# Patient Record
Sex: Male | Born: 1945 | Race: White | Hispanic: No | Marital: Married | Smoking: Never smoker
Health system: Southern US, Community
[De-identification: ages and names within clinical notes are randomized; demographics above are authoritative.]

## PROBLEM LIST (undated history)

## (undated) DIAGNOSIS — H9191 Unspecified hearing loss, right ear: Secondary | ICD-10-CM

## (undated) DIAGNOSIS — L570 Actinic keratosis: Secondary | ICD-10-CM

## (undated) DIAGNOSIS — E119 Type 2 diabetes mellitus without complications: Secondary | ICD-10-CM

## (undated) DIAGNOSIS — R112 Nausea with vomiting, unspecified: Secondary | ICD-10-CM

## (undated) DIAGNOSIS — E785 Hyperlipidemia, unspecified: Secondary | ICD-10-CM

## (undated) DIAGNOSIS — I10 Essential (primary) hypertension: Secondary | ICD-10-CM

## (undated) DIAGNOSIS — R490 Dysphonia: Secondary | ICD-10-CM

## (undated) DIAGNOSIS — I2510 Atherosclerotic heart disease of native coronary artery without angina pectoris: Secondary | ICD-10-CM

## (undated) DIAGNOSIS — Z8719 Personal history of other diseases of the digestive system: Secondary | ICD-10-CM

## (undated) DIAGNOSIS — M109 Gout, unspecified: Secondary | ICD-10-CM

## (undated) DIAGNOSIS — R42 Dizziness and giddiness: Secondary | ICD-10-CM

## (undated) DIAGNOSIS — D333 Benign neoplasm of cranial nerves: Secondary | ICD-10-CM

## (undated) DIAGNOSIS — N189 Chronic kidney disease, unspecified: Secondary | ICD-10-CM

## (undated) DIAGNOSIS — Z9889 Other specified postprocedural states: Secondary | ICD-10-CM

## (undated) HISTORY — DX: Actinic keratosis: L57.0

## (undated) HISTORY — PX: BACK SURGERY: SHX140

## (undated) HISTORY — DX: Hyperlipidemia, unspecified: E78.5

## (undated) HISTORY — PX: CARPAL TUNNEL RELEASE: SHX101

## (undated) HISTORY — DX: Essential (primary) hypertension: I10

## (undated) HISTORY — PX: OTHER SURGICAL HISTORY: SHX169

## (undated) HISTORY — DX: Personal history of other diseases of the digestive system: Z87.19

## (undated) HISTORY — DX: Type 2 diabetes mellitus without complications: E11.9

## (undated) HISTORY — DX: Atherosclerotic heart disease of native coronary artery without angina pectoris: I25.10

## (undated) HISTORY — DX: Chronic kidney disease, unspecified: N18.9

## (undated) HISTORY — DX: Dysphonia: R49.0

## (undated) HISTORY — DX: Unspecified hearing loss, right ear: H91.91

## (undated) HISTORY — PX: CORONARY ANGIOPLASTY WITH STENT PLACEMENT: SHX49

## (undated) HISTORY — DX: Benign neoplasm of cranial nerves: D33.3

---

## 1994-01-03 DIAGNOSIS — Z8719 Personal history of other diseases of the digestive system: Secondary | ICD-10-CM

## 1994-01-03 HISTORY — DX: Personal history of other diseases of the digestive system: Z87.19

## 1997-08-27 ENCOUNTER — Ambulatory Visit (HOSPITAL_COMMUNITY): Admission: RE | Admit: 1997-08-27 | Discharge: 1997-08-27 | Payer: Self-pay | Admitting: Neurosurgery

## 1997-09-05 ENCOUNTER — Ambulatory Visit (HOSPITAL_COMMUNITY): Admission: RE | Admit: 1997-09-05 | Discharge: 1997-09-06 | Payer: Self-pay | Admitting: Neurosurgery

## 1997-09-05 ENCOUNTER — Encounter: Payer: Self-pay | Admitting: Neurosurgery

## 2002-08-19 ENCOUNTER — Encounter: Payer: Self-pay | Admitting: Neurosurgery

## 2002-08-20 ENCOUNTER — Encounter: Payer: Self-pay | Admitting: Neurosurgery

## 2002-08-20 ENCOUNTER — Inpatient Hospital Stay (HOSPITAL_COMMUNITY): Admission: RE | Admit: 2002-08-20 | Discharge: 2002-08-21 | Payer: Self-pay | Admitting: Neurosurgery

## 2004-04-22 ENCOUNTER — Ambulatory Visit: Payer: Self-pay | Admitting: Specialist

## 2005-11-03 HISTORY — PX: CORONARY ARTERY BYPASS GRAFT: SHX141

## 2005-11-18 ENCOUNTER — Encounter: Payer: Self-pay | Admitting: Vascular Surgery

## 2005-11-20 ENCOUNTER — Inpatient Hospital Stay (HOSPITAL_COMMUNITY): Admission: RE | Admit: 2005-11-20 | Discharge: 2005-11-27 | Payer: Self-pay | Admitting: Cardiovascular Disease

## 2005-12-21 ENCOUNTER — Encounter: Payer: Self-pay | Admitting: Psychiatry

## 2005-12-23 ENCOUNTER — Encounter
Admission: RE | Admit: 2005-12-23 | Discharge: 2005-12-23 | Payer: Self-pay | Admitting: Thoracic Surgery (Cardiothoracic Vascular Surgery)

## 2006-01-03 ENCOUNTER — Encounter: Payer: Self-pay | Admitting: Psychiatry

## 2006-02-03 ENCOUNTER — Encounter: Payer: Self-pay | Admitting: Psychiatry

## 2006-03-04 ENCOUNTER — Encounter: Payer: Self-pay | Admitting: Psychiatry

## 2006-04-04 ENCOUNTER — Encounter: Payer: Self-pay | Admitting: Psychiatry

## 2007-04-23 ENCOUNTER — Encounter: Payer: Self-pay | Admitting: Family Medicine

## 2007-05-04 ENCOUNTER — Encounter: Payer: Self-pay | Admitting: Family Medicine

## 2007-06-26 ENCOUNTER — Ambulatory Visit (HOSPITAL_COMMUNITY): Admission: RE | Admit: 2007-06-26 | Discharge: 2007-06-26 | Payer: Self-pay | Admitting: Cardiovascular Disease

## 2008-07-21 IMAGING — CR DG CHEST 1V PORT
1 series · 1 of 1 positions shown · non-contrast
Comparison: 11/22/05 at 0444 hours.

CLINICAL DATA: Status post CABG.  
 PORTABLE CHEST - 1 VIEW, 11/23/05 AT 3943 HOURS:

[view not recorded]
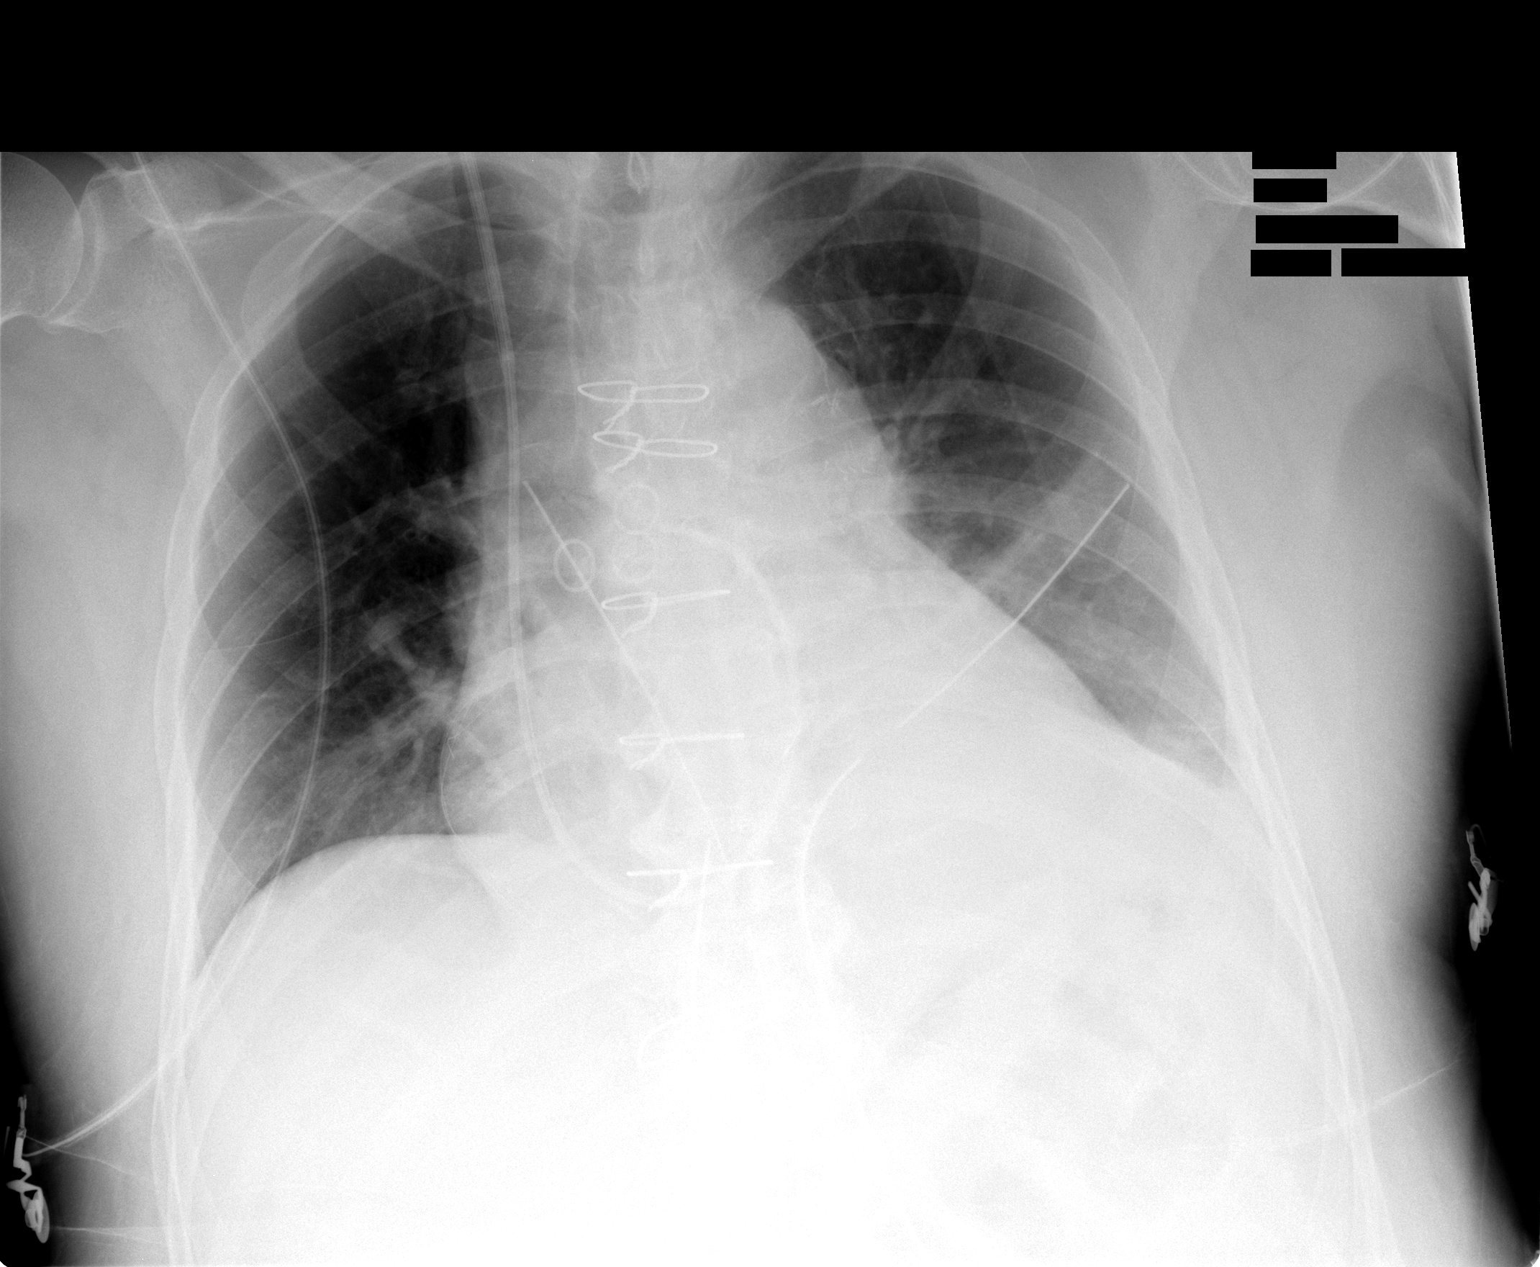

[1 of 1 positions shown; findings below may reference images not displayed]

FINDINGS: Endotracheal tube and nasogastric tube have been removed.  Right-sided Swan Ganz catheter tip projects over the right main pulmonary artery.  Left-sided chest tube remains in place without evidence of pneumothorax.   Mediastinal drain in place.  Cardiomegaly.  Central pulmonary vascular prominence.  Interval development of opacification medial aspect of left base may represent atelectasis.  Epicardial leads remain in place.
IMPRESSION: 1.  Endotracheal tube and nasogastric tube have been removed.
 2.  Interval development of opacification, medial aspect, left base may represent atelectasis.
 3.  Remainder of findings unchanged.

## 2008-11-13 LAB — HM COLONOSCOPY

## 2009-01-22 ENCOUNTER — Encounter: Payer: Self-pay | Admitting: Cardiovascular Disease

## 2009-03-19 ENCOUNTER — Ambulatory Visit: Payer: Self-pay | Admitting: Gastroenterology

## 2009-07-08 ENCOUNTER — Ambulatory Visit: Payer: Self-pay | Admitting: Otolaryngology

## 2009-07-14 ENCOUNTER — Ambulatory Visit: Payer: Self-pay | Admitting: Cardiovascular Disease

## 2009-07-14 DIAGNOSIS — I10 Essential (primary) hypertension: Secondary | ICD-10-CM

## 2009-07-14 DIAGNOSIS — E785 Hyperlipidemia, unspecified: Secondary | ICD-10-CM

## 2009-07-14 DIAGNOSIS — E119 Type 2 diabetes mellitus without complications: Secondary | ICD-10-CM | POA: Insufficient documentation

## 2009-07-14 DIAGNOSIS — I25810 Atherosclerosis of coronary artery bypass graft(s) without angina pectoris: Secondary | ICD-10-CM | POA: Insufficient documentation

## 2009-07-14 DIAGNOSIS — E1169 Type 2 diabetes mellitus with other specified complication: Secondary | ICD-10-CM | POA: Insufficient documentation

## 2009-07-14 DIAGNOSIS — I129 Hypertensive chronic kidney disease with stage 1 through stage 4 chronic kidney disease, or unspecified chronic kidney disease: Secondary | ICD-10-CM | POA: Insufficient documentation

## 2009-07-14 DIAGNOSIS — I2510 Atherosclerotic heart disease of native coronary artery without angina pectoris: Secondary | ICD-10-CM | POA: Insufficient documentation

## 2010-01-03 DIAGNOSIS — D333 Benign neoplasm of cranial nerves: Secondary | ICD-10-CM

## 2010-01-03 HISTORY — DX: Benign neoplasm of cranial nerves: D33.3

## 2010-01-20 ENCOUNTER — Ambulatory Visit
Admission: RE | Admit: 2010-01-20 | Discharge: 2010-01-20 | Payer: Self-pay | Source: Home / Self Care | Attending: Cardiovascular Disease | Admitting: Cardiovascular Disease

## 2010-01-20 ENCOUNTER — Encounter: Payer: Self-pay | Admitting: Cardiovascular Disease

## 2010-02-02 NOTE — Assessment & Plan Note (Signed)
Summary: NEW PT   Visit Type:  Initial Consult Primary Provider:  Dr. Zada Finders  CC:  No cardiac problems.  Has hearing loss and undergoing some test.  .  History of Present Illness: 65 year old gentleman with a history of coronary artery disease, bypass surgery x5 with a LIMA to the LAD, vein graft to the B1, vein graft to the OM1 and OM 2, vein graft to the RCA with repeat catheterization in June 2009 showing 95% stenosis of the ostial LAD with total occlusion of the proximal LAD, 70% proximal circumflex disease with occlusion of the marginal vessel from the AV groove, diffuse RCA disease with 80% proximal disease followed by 95% mid RCA disease, patent vein grafts. He has obesity, diabetes, hypertension, hyperlipidemia. He is new to [facility] cardiology, was previously seen by Dr. Tresa Endo at [facility] and vascular Center. Last note we have is from August 2009.  He presents to establish care. Overall he states that his sugars have been reasonably well controlled. He continues to work part time and stay active. He denies any symptoms of chest pain or shortness of breath. He used to have lower extremity edema though this has improved on low-dose diuretic. No lightheadedness or dizziness. He sleeps well.  He did receive recent news that a mass is apparent on MRI of his head that might be affecting his hearing. He is due to have followup today for this growth.  EKG shows normal sinus rhythm with rate of 82 beats per minute, no significant ST or T wave changes.  Lipids from January 2011; total cholesterol 127, HDL 38, LDL 60  Preventive Screening-Counseling & Management  Alcohol-Tobacco     Smoking Status: never  Caffeine-Diet-Exercise     Does Patient Exercise: no      Drug Use:  no.    Current Medications (verified): 1)  Glyburide-Metformin 2.5-500 Mg Tabs (Glyburide-Metformin) .Marland Kitchen.. 1 Two Times A Day 2)  Furosemide 40 Mg Tabs (Furosemide) .Marland Kitchen.. 1-2 Tablets Everyday 3)  Carvedilol 25  Mg Tabs (Carvedilol) .Marland Kitchen.. 1 Two Times A Day 4)  Oxybutynin Chloride 5 Mg Tabs (Oxybutynin Chloride) .Marland Kitchen.. 1 Once Daily 5)  Ramipril 5 Mg Caps (Ramipril) .Marland Kitchen.. 1 Once Daily 6)  Dexilant 30 Mg Cpdr (Dexlansoprazole) .Marland Kitchen.. 1 Once Daily 7)  Aspir-Low 81 Mg Tbec (Aspirin) .Marland Kitchen.. 1 Once Daily  Allergies (verified): 1)  ! Codeine  Past History:  Family History: Last updated: 07/14/2009 Family History of Coronary Artery Disease: father deceased age 39 with MI. Family History of Cancer:  Family History of Diabetes:  Family History of Coronary Artery Disease: brother  Social History: Last updated: 07/14/2009 Tobacco Use - No.  Alcohol Use - no Regular Exercise - no Drug Use - no Married  Retired: works part time at [employer] 2-3 hrs. at night.  Risk Factors: Exercise: no (07/14/2009)  Risk Factors: Smoking Status: never (07/14/2009)  Past Medical History: CAD Hyperlipidemia Hypertension diabetes mellitus hearing loss in the right ear hoarseness  Past Surgical History: CABG x 5: Nov.2007 carpal tunnel surg. ruptured disk surg.(neck) back surg. cyst removed on hand (right)  Family History: Family History of Coronary Artery Disease: father deceased age 100 with MI. Family History of Cancer:  Family History of Diabetes:  Family History of Coronary Artery Disease: brother  Social History: Tobacco Use - No.  Alcohol Use - no Regular Exercise - no Drug Use - no Married  Retired: works part time at [employer] 2-3 hrs. at night. Smoking Status:  never Does Patient Exercise:  no Drug Use:  no  Review of Systems  The patient denies fever, weight loss, weight gain, vision loss, decreased hearing, hoarseness, chest pain, syncope, dyspnea on exertion, peripheral edema, prolonged cough, abdominal pain, incontinence, muscle weakness, depression, and enlarged lymph nodes.    Vital Signs:  Patient profile:   65 year old male Height:      67 inches Weight:      239 pounds BMI:      37.57 Pulse rate:   82 / minute BP sitting:   150 / 86  (left arm) Cuff size:   regular  Vitals Entered By: Bishop Dublin, CMA (July 14, 2009 11:09 AM)  Nutrition Counseling: Patient's BMI is greater than 25 and therefore counseled on weight management options.  Physical Exam  General:  Well developed, well nourished, in no acute distress. Obese Head:  normocephalic and atraumatic Neck:  Neck supple, no JVD. No masses, thyromegaly or abnormal cervical nodes. Chest Wall:  no deformities or breast masses noted Lungs:  Clear bilaterally to auscultation and percussion. Heart:  Non-displaced PMI, chest non-tender; regular rate and rhythm, S1, S2 without murmurs, rubs or gallops. Carotid upstroke normal, no bruit. Pedals normal pulses. No edema, no varicosities. Abdomen:  Bowel sounds positive; abdomen soft and non-tender without masses Msk:  Back normal, normal gait. Muscle strength and tone normal. Pulses:  pulses normal in all 4 extremities Extremities:  No clubbing or cyanosis. Neurologic:  Alert and oriented x 3. Skin:  Intact without lesions or rashes. Psych:  Normal affect.   New Orders:     1)  EKG w/ Interpretation (93000)   Impression & Recommendations:  Problem # 1:  CAD, ARTERY BYPASS GRAFT (ICD-414.04) bypass surgery in November 2007, cardiac catheter in June 2009 showing patent grafts. No symptoms concerning for angina. We'll continue aggressive medical management.  His updated medication list for this problem includes:    Carvedilol 25 Mg Tabs (Carvedilol) .Marland Kitchen... 1 two times a day    Ramipril 5 Mg Caps (Ramipril) .Marland Kitchen... 1 once daily    Aspir-low 81 Mg Tbec (Aspirin) .Marland Kitchen... 1 once daily  Problem # 2:  HYPERLIPIDEMIA-MIXED (ICD-272.4) Cholesterol is well controlled on Vytorin 10/20 mg daily.  Problem # 3:  HYPERTENSION, BENIGN (ICD-401.1) blood pressure is borderline elevated. We've asked him to monitor his pressure. If it continues to be elevated, we could  increase his ACE inhibitor. He had some distressing use in that he had a report back from an MRI showing a mass in his head that was affecting his hearing. He believes that this is making him nervous.  His updated medication list for this problem includes:    Furosemide 40 Mg Tabs (Furosemide) .Marland Kitchen... 1-2 tablets everyday    Carvedilol 25 Mg Tabs (Carvedilol) .Marland Kitchen... 1 two times a day    Ramipril 5 Mg Caps (Ramipril) .Marland Kitchen... 1 once daily    Aspir-low 81 Mg Tbec (Aspirin) .Marland Kitchen... 1 once daily  Other Orders: EKG w/ Interpretation (93000)

## 2010-02-02 NOTE — Letter (Signed)
Summary: Historic Patient File  Historic Patient File   Imported By: [place]bo 07/15/2009 08:46:46  _____________________________________________________________________  External Attachment:    Type:   Image     Comment:   External Document

## 2010-02-04 NOTE — Assessment & Plan Note (Signed)
Summary: F/U 6 MONTHS   Visit Type:  Follow-up Primary Provider:  Dr. Zada Finders  CC:  "doing well" denies chest pain and SOB.  History of Present Illness: 65 year old gentleman with a history of coronary artery disease, bypass surgery x5 with a LIMA to the LAD, vein graft to the B1, vein graft to the OM1 and OM 2, vein graft to the RCA with repeat catheterization in June 2009 showing 95% stenosis of the ostial LAD with total occlusion of the proximal LAD, 70% proximal circumflex disease with occlusion of the marginal vessel from the AV groove, diffuse RCA disease with 80% proximal disease followed by 95% mid RCA disease, patent vein grafts. He has obesity, diabetes, hypertension, hyperlipidemia.   he reports that he is doing well. He walks without any significant chest pain or shortness of breath. He does report some episodes of pain in his teeth though he had a teeth cleaning and changed his toothbrush and his symptoms have resolved. Previous anginal symptoms included pain in the teeth. Currently he feels well with no complaints.  He does report having to decide between radiation treatment or surgical treatment for a growth in his head that is affecting his balance. Surgery or radiation treatment will be done at [facility]. He plans on having this done at the end of the summer  EKG shows normal sinus rhythm with rate of 88 beats per minute with no significant ST or T wave changes  Lipids from January 2011; total cholesterol 127, HDL 38, LDL 60  Current Medications (verified): 1)  Glyburide-Metformin 2.5-500 Mg Tabs (Glyburide-Metformin) .... 2 Tablets in Morning and 1 Tablet At Night 2)  Furosemide 40 Mg Tabs (Furosemide) .Marland Kitchen.. 1-2 Tablets Everyday 3)  Carvedilol 25 Mg Tabs (Carvedilol) .Marland Kitchen.. 1-2 Tablets Daily 4)  Ramipril 5 Mg Caps (Ramipril) .Marland Kitchen.. 1 Once Daily 5)  Aspir-Low 81 Mg Tbec (Aspirin) .... 2 Tablets Daily 6)  Vesicare 5 Mg Tabs (Solifenacin Succinate) .Marland Kitchen.. 1 Tablet Daily 7)   Nexium 20 Mg Pack (Esomeprazole Magnesium) .Marland Kitchen.. 1 Tablet Daily 8)  Prevacid 15 Mg Cpdr (Lansoprazole) .Marland Kitchen.. 1 Tablet Daily 9)  Vytorin 10-20 Mg Tabs (Ezetimibe-Simvastatin) .Marland Kitchen.. 1 Tablet Daily 10)  Meloxicam 15 Mg Tabs (Meloxicam) .... As Needed 11)  Omega-3 Fish Oil 1200 Mg Caps (Omega-3 Fatty Acids) .Marland Kitchen.. 1 Tablet Daily 12)  Multivitamins  Tabs (Multiple Vitamin) .Marland Kitchen.. 1 Tablet Daily 13)  Januvia 50 Mg Tabs (Sitagliptin Phosphate) .Marland Kitchen.. 1 Tablet Two Times A Day 14)  Mylanta 200-200-20 Mg/68ml Susp (Alum & Mag Hydroxide-Simeth) .... As Needed  Allergies (verified): 1)  ! Codeine  Past History:  Past Medical History: Last updated: 07/14/2009 CAD Hyperlipidemia Hypertension diabetes mellitus hearing loss in the right ear hoarseness  Past Surgical History: Last updated: 07/14/2009 CABG x 5: Nov.2007 carpal tunnel surg. ruptured disk surg.(neck) back surg. cyst removed on hand (right)  Family History: Last updated: 07/14/2009 Family History of Coronary Artery Disease: father deceased age 23 with MI. Family History of Cancer:  Family History of Diabetes:  Family History of Coronary Artery Disease: brother  Social History: Last updated: 07/14/2009 Tobacco Use - No.  Alcohol Use - no Regular Exercise - no Drug Use - no Married  Retired: works part time at [employer] 2-3 hrs. at night.  Risk Factors: Exercise: no (07/14/2009)  Risk Factors: Smoking Status: never (07/14/2009)  Review of Systems  The patient denies fever, weight loss, weight gain, vision loss, decreased hearing, hoarseness, chest pain, syncope, dyspnea on exertion, peripheral edema, prolonged  cough, abdominal pain, incontinence, muscle weakness, depression, and enlarged lymph nodes.         Balance problems  Vital Signs:  Patient profile:   65 year old male Height:      67 inches Weight:      240.25 pounds BMI:     37.76 Pulse rate:   88 / minute BP sitting:   143 / 85  (left arm) Cuff size:    regular  Vitals Entered By: Lysbeth Galas CMA (January 20, 2010 11:30 AM)  Physical Exam  General:  Well developed, well nourished, in no acute distress. Morbidly Obese Head:  normocephalic and atraumatic Neck:  Neck supple, no JVD. No masses, thyromegaly or abnormal cervical nodes. Lungs:  Clear bilaterally to auscultation and percussion. Heart:  Non-displaced PMI, chest non-tender; regular rate and rhythm, S1, S2 without murmurs, rubs or gallops. Carotid upstroke normal, no bruit. Pedals normal pulses. No edema, no varicosities. Abdomen:  Bowel sounds positive; abdomen soft and non-tender without masses, obese Msk:  Back normal, normal gait. Muscle strength and tone normal. Pulses:  pulses normal in all 4 extremities Extremities:  No clubbing or cyanosis. Neurologic:  Alert and oriented x 3. Skin:  Intact without lesions or rashes. Psych:  Normal affect.   Impression & Recommendations:  Problem # 1:  CAD, ARTERY BYPASS GRAFT (ICD-414.04) we will watch him closely for further episodes of jaw pain which has been his anginal equivalent in the past. Currently he feels well with no complaints. No further stress testing would be needed prior to his brain surgery as I see continues to feel well.  His updated medication list for this problem includes:    Carvedilol 25 Mg Tabs (Carvedilol) .Marland Kitchen... 1-2 tablets daily    Ramipril 5 Mg Caps (Ramipril) .Marland Kitchen... 1 once daily    Aspir-low 81 Mg Tbec (Aspirin) .Marland Kitchen... 2 tablets daily  Problem # 2:  HYPERLIPIDEMIA-MIXED (ICD-272.4) Cholesterol last year was well controlled. I asked him to suddenly his new cholesterol in several months time when this is done.  His updated medication list for this problem includes:    Vytorin 10-20 Mg Tabs (Ezetimibe-simvastatin) .Marland Kitchen... 1 tablet daily  Problem # 3:  HYPERTENSION, BENIGN (ICD-401.1) Blood pressure is reasonably well controlled on his current medication regimen. We have encouraged weight loss.  His  updated medication list for this problem includes:    Furosemide 40 Mg Tabs (Furosemide) .Marland Kitchen... 1-2 tablets everyday    Carvedilol 25 Mg Tabs (Carvedilol) .Marland Kitchen... 1-2 tablets daily    Ramipril 5 Mg Caps (Ramipril) .Marland Kitchen... 1 once daily    Aspir-low 81 Mg Tbec (Aspirin) .Marland Kitchen... 2 tablets daily  Orders: EKG w/ Interpretation (93000)  Problem # 4:  DM (ICD-250.00) Sugars had been elevated though have improved on Januvia. We have encouraged exercise and weight loss.  His updated medication list for this problem includes:    Glyburide-metformin 2.5-500 Mg Tabs (Glyburide-metformin) .Marland Kitchen... 2 tablets in morning and 1 tablet at night    Ramipril 5 Mg Caps (Ramipril) .Marland Kitchen... 1 once daily    Aspir-low 81 Mg Tbec (Aspirin) .Marland Kitchen... 2 tablets daily    Januvia 50 Mg Tabs (Sitagliptin phosphate) .Marland Kitchen... 1 tablet two times a day  Patient Instructions: 1)  Your physician recommends that you schedule a follow-up appointment in: 6 months 2)  Your physician recommends that you continue on your current medications as directed. Please refer to the Current Medication list given to you today. Prescriptions: VYTORIN 10-20 MG TABS (EZETIMIBE-SIMVASTATIN) 1 tablet  daily  #30 x 12   Entered by:   Lanny Hurst RN   Authorized by:   Dossie Arbour MD   Signed by:   Lanny Hurst RN on 01/20/2010   Method used:   Electronically to        [facility]. # [phone]* (retail)       [address]       [place], [place]  [address]       Ph: [phone]       Fax: [phone]   RxID:   [id number]

## 2010-04-22 ENCOUNTER — Telehealth: Payer: Self-pay | Admitting: *Deleted

## 2010-04-22 MED ORDER — EZETIMIBE-SIMVASTATIN 10-20 MG PO TABS: 0.5000 | ORAL_TABLET | Freq: Every day | ORAL | Status: DC

## 2010-04-22 NOTE — Telephone Encounter (Signed)
Called pt and notified that per Dr. Mariah Milling his cholesterol very low per results we received from Dr. Zada Finders. Notified pt that Dr. Mariah Milling recc he cut Vytorin 10/20mg  in half. Pt will cut dose in half and when he needs new rx he will call office.

## 2010-05-18 NOTE — Cardiovascular Report (Signed)
NAME:  [name], [name] NO.:  [id number]   MEDICAL RECORD NO.:  [id number]          PATIENT TYPE:  OIB   LOCATION:  2899                         FACILITY:  [facility]   PHYSICIAN:  Nicki Guadalajara, M.D.     DATE OF BIRTH:  [birth year]   DATE OF PROCEDURE:  DATE OF DISCHARGE:  06/26/2007                            CARDIAC CATHETERIZATION   INDICATIONS:  [name] is a 65 year old gentleman who underwent  CABG revascularization surgery in November 2007 x 5 with LIMA to the  LAD, veins to the first diagonal, sequential vein to the OM1 and OM2,  and vein to the distal right coronary artery.  The patient had been  doing fairly well, but has noticed increasing fatigue.  He does have a  history of palpitations.  As part of the exercise prescription, a recent  nuclear stress test was interpreted as moderate-to-high risk with mild  ischemia in the anterior, inferior, and apical regions.  Ejection  fraction poststress was 41%.  Consequently, definitive cardiac  catheterization was recommended.   PROCEDURE:  After premedication with Versed 2 mg intravenously, the  patient was prepped and draped in usual fashion.  Right femoral artery  was punctured anteriorly and a 5-French sheath was inserted.  Diagnostic  catheterization was done utilizing 5-French Judkins for left coronary  catheter as well as Judkins for right coronary catheter.  The right  catheter was also used for selective angiography in the vein graft  supplying the diagonal vessel as well as the sequential graft supplying  the OM1 and OM2 vessel.  There was significant difficulty in selectively  cannulating the vein graft supplying the right coronary artery.  Multiple catheters were utilized including an FR-4, FR-5, and vein  catheter as well as bypass graft catheter.  HUMI catheter was used for  selective angiography into left internal mammary artery.  A 5-French  pigtail catheter was used for biplane cine left  ventriculography as well  as distal aortography.  The patient tolerated the procedure well.  Hemostasis was obtained by direct manual pressure.   HEMODYNAMIC DATA:  Central aortic pressure is 125/60, left ventricle  pressure is 125/10, and post A-wave 21.   ANGIOGRAPHIC DATA:  Left main coronary artery was a short vessel, which  trifurcated into an LAD and then leaves as a small intermediate vessel  and left circumflex vessel.   There was significant coronary calcification involving particularly the  LAD system.   The LAD had diffuse 95% ostial stenosis and that was totally occluded  after a small second septal perforating artery.   The intermediate vessel was small caliber and normal.   The circumflex vessel with diffuse 70% proximal stenoses.  The marginal  vessels were not visualized from the AV groove circumflex.   The right coronary artery had 80% proximal narrowing.  There was diffuse  80%-95% narrowing in the midportion extending to the crux after a  moderate-sized anterior RV marginal branch.  A flush and fill phenomena  was seen in the PDA.  The distal RCA was small caliber and extended to  fill and ended in  a small PLA into the LV branch.   The LIMA graft to the LAD was widely patent.   The vein graft to the diagonal vessel was widely patent.   The sequential vein graft to the OM1 and OM2 vessel was widely patent.   There was very significant difficulty in selectively cannulating the  vein graft to the right coronary artery, but ultimately visualization  was demonstrated and this graft appeared patent throughout its course,  anastomosed into the distal RCA, PDA like system without significant  stenoses.   Biplane cine left ventriculography revealed low normal/normal LV  contractility without focal segmental wall motion abnormalities.  Ejection fraction was approximately was in the 50%-55%.   Distal aortography did not demonstrate any renal artery stenosis.   There  was no significant aortoiliac disease.   IMPRESSION:  1. Severe native coronary obstructive disease with evidence for      significant coronary calcification and 95% stenosis involving the      ostium of the left anterior descending artery diffusely with total      occlusion of the proximal left anterior descending; diffuse 70%      proximal atrioventricular groove circumflex stenosis with an      occlusion of the marginal vessels from the atrioventricular groove      circumflex and diffuse native right coronary artery disease with      80% proximal followed by diffuse 80%-90%-95% mid right coronary      artery narrowing.  2. Patent left internal mammary artery to the left anterior      descending.  3. Patent vein graft to the diagonal vessel  4. Patent sequential vein graft to the obtuse marginal 1 and obtuse      marginal 2.  5. Patent vein graft to the right coronary artery.   RECOMMENDATIONS:  Medical therapy.           ______________________________  Nicki Guadalajara, M.D.     TK/MEDQ  D:  06/26/2007  T:  06/26/2007  Job:  [id number]   cc:   Rudi Coco

## 2010-05-21 NOTE — Discharge Summary (Signed)
NAME:  [name], [name]                   ACCOUNT NO.:  `   MEDICAL RECORD NO.:  [id number]          PATIENT TYPE:  INP   LOCATION:  2004                         FACILITY:  [facility]   PHYSICIAN:  Salvatore Decent. Dorris Fetch, M.D.DATE OF BIRTH:  Mar 28, 1945   DATE OF ADMISSION:  11/18/2005  DATE OF DISCHARGE:  11/27/2005                               DISCHARGE SUMMARY   ADDENDUM:  This is an addendum to the patient's discharge summary  dictated, November 25, 2005.   The patient was tentatively ready for discharge home November 26, 2005.  His hemoglobin/hematocrit were stabilized.  On November 24th, a followup  hemoglobin/hematocrit were decreased to 7.5 and 21.4.  At that time, it  was felt best to transfuse the patient with packed red blood cells,  unit.  The patient noted to be asymptomatic from this anemia.  He did  receive 1 unit of blood post-op day #4.  Post-op day #5,  hemoglobin/hematocrit were rechecked and slightly increased at 7.8 and  23%.  The patient's vital signs were stable.  Again, he was  asymptomatic.  He was sating greater than 90% on room air.  He remained  in a normal sinus rhythm with incisions clean, dry, and intact and  healing well.  The patient was ready for discharge home post-op day #5,  November 27, 2005.   Please see dictated discharge summary for followup appointment and  instructions.   DISCHARGE MEDICATIONS:  1. Aspirin 325 mg daily.  2. Coreg 12.5 mg b.i.d.  3. Vytorin 10/20 daily.  4. Altace 2.5 mg daily.  5. Folic acid 1 mg daily.  6. Flomax 0.4 mg daily.  7. Actos 45 mg daily.  8. Glyburide/metformin 2.5/500 mg, two tablets daily.  9. Lasix 40 mg daily.  10.Potassium chloride 20 mEq daily.  11.Niferex 150 mg daily.  12.Mobic as needed.  13.Nexium 40 mg b.i.d.Marland Kitchen  14.Ultram 50 mg, one to two tablets every four to six hours p.r.n.      pain.      Theda Belfast, PA    ______________________________  Salvatore Decent Dorris Fetch, M.D.   KMD/MEDQ   D:  12/20/2005  T:  12/20/2005  Job:  [id number]   cc:   Nicki Guadalajara, M.D.

## 2010-05-21 NOTE — Consult Note (Signed)
NAMELINDA, BIEHN NO.:  [id number]   MEDICAL RECORD NO.:  [id number]          PATIENT TYPE:  OIB   LOCATION:  2915                         FACILITY:  [facility]   PHYSICIAN:  Salvatore Decent. Dorris Fetch, M.D.DATE OF BIRTH:  04/14/1945   DATE OF CONSULTATION:  11/18/2005  DATE OF DISCHARGE:                                   CONSULTATION   REASON FOR CONSULTATION:  Three vessel coronary disease.   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  He has no previous cardiac  history.  He described his episode of chest discomfort as being as an  indigestion like feeling then progressing to tightness or heaviness which  then radiates to his neck, jaw and teeth.  He has noted shortness of breath  with this.  He had three severe episodes three days in a row, this was about  three weeks ago.  Since then,  he has had a couple of episodes only with  exertion.  There has been no nocturnal or rest pain. He had a stress spect  scan which showed significant ischemia in the LAD distribution and  significant decrease in his ejection fraction from 60% to 23% post stress.  He also had an echocardiogram which showed trace mitral and tricuspid  regurgitation but normal left ventricular function at rest. Today he  underwent cardiac catheterization by Dr. Nicki Guadalajara where he was found to  have severe three vessel coronary disease with a critical LAD lesion that  was 99% at the ostium.  He had a 95% stenosis and a small ramus intermedius  vessel.  He had 50% bifurcation lesion in a large circumflex and a 60-70% in  his right coronary.  His ejection fraction was 50-60%.   Mr. Traynham is currently pain free.   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   PAST SURGICAL HISTORY:  Previous carpal tunnel repair and repair of a  ruptured cervical disc, he does have a plate in his neck.   MEDICATIONS ON ADMISSION:  Glyburide/metformin 2.5/500, 2 tablets daily,  Vytorin 10/20 mg, 1 tablet daily, Mobic 7.5 mg daily p.r.n.,  Benicar/hydrochlorothiazide 40/12.5, 1 daily, aspirin 81 mg daily, Flomax  0.4 mg daily, Nexium 40 kg b.i.d., Actos 45 mg daily, and nitroglycerin 0.4  mg as needed.   ALLERGIES:  CODEINE.   REVIEW OF SYSTEMS:  He has noted that he has been fatigued more easily over  the past several months. No orthopnea, paroxysmal nocturnal dyspnea, or  peripheral edema.  No recent with issues regarding his pancreas. No change  in bowel habits. He does have urinary frequency.  He does work Armed forces logistics/support/administrative officer. All  other systems negative.   PHYSICAL EXAMINATION:  GENERAL:  Mr. Cardin is a 65 year old white male in no acute distress, in  general, he is well-developed and well-nourished.  NEUROLOGICAL:  Alert and oriented x3, he  is appropriate and grossly intact.  HEENT:  Exam is unremarkable.  NECK:  Supple without thyromegaly, adenopathy or bruits.  CARDIAC:  Regular rate and rhythm, normal S1 and S2, no murmurs, gallops,  and rubs.  LUNGS:  Clear with equal breath sounds.  ABDOMEN:  Soft, nontender.  EXTREMITIES:  Without clubbing, cyanosis or edema.  He has 2+ pulses.  SKIN:  Warm and dry.   LABORATORY DATA:  Cardiac workup as previously mentioned. His cholesterol  was 201, triglycerides 193, HDL was 65, LDL 97. His PT was 10.9 and PTT 30.  PSA 0.8. TSH was 0.612.  His BUN was 124 and creatinine 1.2, sodium 141,  potassium 4.3, albumin 4.7. Hematocrit 38, platelets 238, white count 6.1.   IMPRESSION:  Mr. Walmsley is a 65 year old diabetic gentleman with multiple  cardiac risk factors who presents with a three week history of exertional  chest pain. At catheterization, he has a critical ostial  and left main  stenosis and significant but moderate disease in his right coronary and left  circumflex. Coronary bypass grafting is indicated for survival benefit and  relief of symptoms.  I have discussed in detail with Mr. Schowalter the nature  and extent of the operation including the incisions to be used and the need  for general anesthesia.  We discussed in detail the indications, benefits,  and alternatives.  He understands the risks include but are not limited to  death, stroke, MI, DVT, PE bleeding, possible need for transfusion,  infection as well as other organ system dysfunction including respiratory,  renal, hepatic, or GI complications.  He understands and accepts these risks  and agrees to proceed.   As Mr. Fissel has had only exertional symptoms and has not had any rest or  nocturnal pain, he will be scheduled for the first elective operating slot  which presently is Tuesday, November 20.  Were he to have unstable symptoms,  he may need to be done emergently.  If an earlier operating time becomes  available he will be moved into that position.           ______________________________  Salvatore Decent Dorris Fetch, M.D.     SCH/MEDQ  D:  11/18/2005  T:  11/18/2005  Job:  [id number]   cc:   Nicki Guadalajara, M.D.  Alan Mulder, M.D.

## 2010-05-21 NOTE — Op Note (Signed)
NAME:  [name], CAMMARATA NO.:  [id number]   MEDICAL RECORD NO.:  [id number]                   PATIENT TYPE:  INP   LOCATION:  2859                                 FACILITY:  [facility]   PHYSICIAN:  Clydene Fake, M.D.               DATE OF BIRTH:  05-24-1945   DATE OF PROCEDURE:  08/20/2002  DATE OF DISCHARGE:                                 OPERATIVE REPORT   PREOPERATIVE DIAGNOSIS:  Herniated nucleus pulposus, left C6-7.   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   PROCEDURE:  Anterior cervical diskectomy and fusion, C6-7, with left-sided  allograft bone and DePuy anterior cervical plate.   SURGEON:  Clydene Fake, M.D.   ASSISTANT:  Payton Doughty, M.D.   General endotracheal tube anesthesia.   ESTIMATED BLOOD LOSS:  Minimal.   BLOOD REPLACED:  None.   DRAINS:  None.   COMPLICATIONS:  None.   REASON FOR PROCEDURE:  The patient is a 65 year old gentleman who has had  neck and left arm pain, numbness into his middle fingers, and weakness in  the left arm at the triceps into finger extension, found on MRI to have some  spondylosis in the cervical spine but at C6-7 a large disk herniation also  on the left side.  The patient was brought in for decompression and  diskectomy and fusion.   PROCEDURE IN DETAIL:  The patient was brought into the operating room,  general anesthesia induced.  The patient was placed in 10 pounds of Holter  traction, prepped and draped in a sterile fashion.  The site of incision was  injected with 10 mL of 1% lidocaine with epinephrine.  An incision was then  made from the midline to the anterior border of the sternocleidomastoid  muscle on the left side of the neck, incision taken down to the platysma,  and hemostasis obtained with Bovie cauterization.  The platysma was incised  with the Bovie and blunt dissection was taken through the anterior cervical  fascia to the anterior cervical spine.   A needle was placed in two disk  spaces.  Because of his body habitus we did not think we could see the C6-7  level, an x-ray was obtained, and the top level was seen at the C5-6 level  and the next needle looked like it was one level below on the x-ray, and  clinically it was, in fact, one level below, that being the C6-7 level.  The  C6-7 level was then incised with a 15 blade and partial diskectomy performed  with pituitary rongeurs.  The longus colli muscle was reflected laterally on  each side and the self-retaining retractor was placed, distraction pins were  placed, and the C6-7 interspace distracted.  Diskectomy was then performed  with pituitary rongeurs, curettes, a high-speed drill removing the  cartilaginous end plates and posterior osteophytes and starting the  foraminotomy.  Kerrison 1 and 2 mm punches were used to complete the central  and lateral decompression, removing the posterior longitudinal ligament and  large pieces of the disk were seen in the ligament, compressing both the  cord and root off to the left side.  These were removed.  When we were  finished we had central decompression of the cord and lateral decompression  of the nerve roots bilaterally, especially on the left side.  The wounds  were irrigated with antibiotic solution, hemostasis obtained with Gelfoam  and thrombin, and the Gelfoam was then irrigated.  After we measured the  height of the interspace with LifeNet trials, measured to be 7 mm, we  roughed up the end plates with the LifeNet broach and then tapped a 7 mm  LifeNet allograft bone into place, counter sinking a couple of millimeters.  We checked behind the graft, and there was plenty of room between dura and  the graft.  The wound was irrigated with antibiotic solution.  An anterior  cervical plate was placed over the anterior cervical spine and two screws  placed into C6 and two into C7, and they were finally tightened down.  Lateral x-ray was  attempted and we could see down barely to the C5-6 level.  No sign of the plate was seen on this x-ray because of the patient's body  habitus.  Clinically we had good position of plate, bone graft, and screws.  The wound was irrigated with antibiotic solution and hemostasis obtained  with Gelfoam and thrombin.  This was irrigated out.  Bipolar cauterization  was also used for hemostasis.  When we were satisfied with the hemostasis,  the platysma was closed with 3-0 Vicryl interrupted suture, the subcutaneous  tissue was closed with the same, and the skin closed with Dermabond skin  glue.  A dry dressing was placed over the incision, a collar was placed.  The patient was then awoken from anesthesia, transferred to the recovery  room in stable condition.                                               Clydene Fake, M.D.    JRH/MEDQ  D:  08/20/2002  T:  08/21/2002  Job:  [id number]

## 2010-05-21 NOTE — Discharge Summary (Signed)
[name], [name] NO.:  [id number]   MEDICAL RECORD NO.:  [id number]          PATIENT TYPE:  INP   LOCATION:  2004                         FACILITY:  [facility]   PHYSICIAN:  [name], M.D.DATE OF BIRTH:  January 18, 1945   DATE OF ADMISSION:  11/18/2005  DATE OF DISCHARGE:                                 DISCHARGE SUMMARY   ATTENDING PHYSICIAN:  [name], M.D.   CARDIOLOGIST:  [name], M.D.   ADMISSION DATE:  November 18, 2005.   DISCHARGE DATE:  Tentatively, November 27, 2005, or one to two days.   ADMISSION DIAGNOSIS:  Abnormal stress test.   DISCHARGE DIAGNOSES:  1. Three-vessel coronary artery disease status post coronary artery bypass      graft.  2. Type 2 diabetes mellitus secondary to pancreatitis.  3. Severe pancreatitis in 1996.  4. Hyperlipidemia.  5. Hypertension.  6. Benign prostatic hypertrophy.   CONSULTS:  Dr. Dorris Pratt for cardiothoracic surgery was consulted on  November 18, 2005.   PROCEDURES:  1. November 18, 2005, the patient underwent cardiac catheterization by Dr.      Nicki Pratt.  2. On November 22, 2005, the patient underwent a CABG x5 (LIMA to LAD,      saphenous vein graft to the RCA, saphenous vein graft to the diagonal,      saphenous vein graft to the OM-1 and OM-2, __________  by Dr.      Dorris Pratt.   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  The patient has no previous cardiac history.  The patient describes the chest discomfort as being indigestion then  progressing to tightness or heaviness which radiates to his neck, jaw and  teeth.  The patient does have associated shortness of breath.  The patient  had three severe episodes three days in a row about three weeks ago.  Since  then, the patient had a collapse only with exertion.  He has no PND or rest  pain.  The patient underwent a stress test which showed significant  ischemia  in the LAD and a significant decrease in ejection fraction, 22% post stress.  He has had an echocardiogram which showed trace mitral and tricuspid  regurgitation with normal LV function at rest.   HOSPITAL COURSE:  Due to the patient's stress test,  he was sent to [facility] for cardiac catheterization which took place on November 18, 2005.  Cardiac cath by Dr. Tresa Pratt showed three-vessel coronary artery  disease, critical LAD lesion of 99% of the ostium.  He had 95% stenosis in  the small ramus intermedius vessel.  He had a 50%  bifurcation lesion in the  large circumflex and 67% in his right coronary.  His EF is 50% to 60%.  Due  to the patient's results, CV/TS consult was obtained and it was best felt  the patient undergo CABG.  The patient was planned for coronary artery  bypass grafting on November 22, 2005.   The patient did have pre-CABG Dopplers which  showed his ABIs within normal  limits.  He had 60% internal carotid artery stenosis on the right and no  internal carotid artery stenosis on the left.  While in the hospital before  his CABG, the patient was maintained on sliding-scale insulin for his  diabetes mellitus and he was started on a heparin drip.  The patient did  experience chest pain on November 21, 2005; the patient's EKG was normal.  Preoperatively, the patient did remain hemodynamically stable and his vital  signs were stable with a well-controlled blood pressure.  The patient  underwent coronary artery bypass grafting x5 on November 22, 2005, without  any complications.  The patient was extubated on November 22, 2005.  The  patient has maintained a normal sinus rhythm throughout his postop care.   The patient was hemodynamically stable postoperatively; however, on postop  day number three, the patient's hemoglobin and hematocrit dropped to  7.6/21.8.  The patient is on Niferex and folic acid.  It is possible that he  will have a blood  transfusion.  We hope that his hemoglobin and hematocrit  will raise appropriately.  The patient did have some postop volume overload,  and he is being diuresed appropriately with Lasix.  The patient is  responding.   Postop day number one, the patient's chest tubes and Swan were discontinued,  and the patient was transferred to 2000.  He is to continue his pulmonary  toilet as instructed. The patient is walking with cardiac rehab x2 with a  steady gait.  Patient's renal function has remained within normal limits.   The patient has a history of diabetes mellitus, and he has been maintained  on a sliding-scale insulin and glyburide.  __________  Have been slightly  controlled at 144/185/155.   DISCHARGE PHYSICAL EXAMINATION:  VITALS:  Afebrile.  Vital signs stable.  Patient is normal sinus rhythm.  Weight is 207, preop weight is 205.  __________  is 145/144/161.  The patient has a CBC which showed a white  blood cell count of 9.2, hemoglobin 7.6, hematocrit 21.8, lymphocytes 154.  He had a BMP which shows a sodium of 137, potassium 4.1, chloride 104, CO2  26, BUN 31, creatinine 1.4, glucose of 142.  CARDIAC:  Regular rate and rhythm.  LUNGS:  Clear to auscultation bilaterally.  ABDOMEN:  Benign.  Incision is clear, dry and intact.  EXTREMITIES:  Positive edema, right greater than the left.   DISCHARGE DISPOSITION:  The patient will be discharged home in the next one  to two days provided his hemoglobin and hematocrit raise appropriately, he  is normal sinus rhythm, the patient's blood pressure is well controlled, he  is ambulating without any difficulty.   MEDICATIONS:  1. Coreg 6.25 mg p.o. b.i.d.  2. Vytorin 10/20 mg p.o. daily.  3. Aspirin 325 mg p.o. daily.  4. Altace 2.5 mg p.o. daily.  5. Ultram 50 mg one to two tablets every four hours p.r.n.  6. Folic acid 1 mg p.o. daily.  7. Flomax 0.4 mg p.o. daily. 8. Actos 45 mg p.o. daily.  9. Glyburide/metformin 2.5/500 mg  b.i.d.  10.Lasix 40 mg p.o. daily x7 days.  11.KCl 20 mEq p.o. daily x7 days.  12.Zithromax 250 mg p.o. daily.  13.Mobic 7.5 mg p.o. as needed.  14.Nexium 40 mg p.o. b.i.d.   INSTRUCTIONS:  The patient is instructed to follow a low-fat, low-salt,  diabetic diet.  No driving or heavy lifting greater than ten pounds for  three  weeks.  The patient is to ambulate three times daily and increase  activity as tolerated.  To continue his breathing exercises.  Only shower  and clean his incision with mild soap and water.  To call the office if any  wound problems shall arise such as incision erythema, drainage, temp grater  than 101.5.   FOLLOWUP:  The patient will have a followup appointment with Dr. Dorris Pratt  in three weeks, the office will contact him with time and date of  appointment.  Prior to seeing Dr. Dorris Pratt, he will have a chest x-ray  taken at [facility].  The patient is to call Dr. Nicki Pratt for an appointment in two weeks.      Constance Holster, PA    ______________________________  Martin Decent [name], M.D.    JMW/MEDQ  D:  11/25/2005  T:  11/25/2005  Job:  [id number]   cc:   [name], M.D.

## 2010-05-21 NOTE — Cardiovascular Report (Signed)
NAME:  ESGAR, BARNICK NO.:  [id number]   MEDICAL RECORD NO.:  [id number]          PATIENT TYPE:  OIB   LOCATION:  2899                         FACILITY:  [facility]   PHYSICIAN:  Nicki Guadalajara, M.D.     DATE OF BIRTH:  01-Jan-1946   DATE OF PROCEDURE:  11/18/2005  DATE OF DISCHARGE:                              CARDIAC CATHETERIZATION   INDICATIONS:  [name] is a 65 year old gentleman who has a history  of diabetes mellitus, hypertension, hyperlipidemia, who I had seen on  November 16, 2005, for initial evaluation through the courtesy Dr. Patrecia Pace  after the patient had an abnormal stress test.  The patient did admit to a  several-week episode of exertional chest tightness.  A stress Myoview study  was abnormal in that, apparently, on echo he had had normal LV function but,  post stress, he had positive ECG, decreased his ejection fraction to 23%,  and had marked ischemia involving the entire LAD territory.  There also was  a suggestion of possible scar apically in the distal anterolateral wall.  The patient was started on medical therapy.  Catheterization was scheduled  for today for definitive evaluation.   PROCEDURE:  After premedication with Valium 5 mg intravenously, the patient  was prepped and draped in the usual fashion.  His right femoral artery was  punctured anteriorly and a 6 French sheath was inserted without difficulty.  Diagnostic catheterization was done utilizing a 6 [place] Judkins for left  and right coronary catheters. The right coronary catheter was used also for  a selective angiography in the left subclavian/internal mammary artery since  the patient will most likely require CBG revascularization surgery.  A  pigtail catheter was used for biplane __________ .  Distal aortography was  also performed.  After the initial angiogram suggested the possibility of  left iliac narrowing, an additional injection was done just above the  bifurcation  with an aortobifemoral runoff.  The patient tolerated procedure  well.  In light of his severe coronary anatomy, he will be admitted to [facility], a CVTS consultation will be obtained.   HEMODYNAMIC DATA:  Central aortic pressure is 100/50.  Left ventricle  pressure is 100/12.   ANGIOGRAPHIC DATA:  The left main coronary artery was angiographically  normal, short vessel, which trifurcated into an LAD and intermedius vessel  in a very large circumflex system.  There was extensive calcification in the  proximal LAD.  The ostium of the LAD had a 99% stenosis.  There was diffuse  80% stenosis in the proximal LAD and 70% narrowing after a diagonal vessel  in the mid LAD.  Flow appeared slightly decreased compared to a brisk flow  in circumflex suggesting TIMI 2-1/2 to slightly less than TIMI III flow.   The ramus intermedius vessel was a small-to-moderate size vessel that had a  95% ostial stenosis.   The circumflex vessel was a very large vessel that had 20% ostial narrowing,  gave rise to a large, bifurcating marginal vessel which had at least 50%  narrowing proximally.  There also was  eccentric narrowing in the AV groove  circumflex of at least 50% after this marginal takeoff.   The right coronary artery was a moderate-size vessel that had been evidence  for significant coronary calcification.  There was 50-60% proximal narrowing  followed by 60-70% mid narrowing.   The left subclavian and internal mammary artery were normal and suitable for  CBG revascularization surgery.   Biplane __________  revealed low normal LV contractility with anterolateral  hypocontractility seen on the RAO projection. On the LAO projection,  contractility appeared normal.   Distal aortography did not reveal any renal artery stenosis.  The initial  aortogram suggested the possibility of narrowing in the left common iliac  artery, however, on runoff, visualization was improved and this was   angiographically normal.   IMPRESSION:  1. Low normal/mild left ventricular dysfunction with anterolateral      hypocontractility.  2. Significant coronary calcification involving now involving the left      anterior descending and right coronary artery with subtotal 99% ostial      left anterior descending stenosis followed by irregularity and stenosis      of 80% proximally, 70% in the mid left anterior descending; 95% ostial      stenosis of a small-to-medium size ramus intermedius vessel; and large      left circumflex coronary with 20% ostial narrowing, 50% narrowing in a      large obtuse marginal 1 vessel, and 50% eccentric narrowing in the AV      groove circumflex.  3. Calcification of the right coronary artery with 50-60% proximal      narrowing and 60-70% mid narrowing.   A CVTS consultation will be obtained for coronary artery bypass graft  surgery.           ______________________________  Nicki Guadalajara, M.D.     TK/MEDQ  D:  11/18/2005  T:  11/18/2005  Job:  [id number]   cc:   Alan Mulder, M.D.  Cardiac cath lab

## 2010-05-21 NOTE — Op Note (Signed)
[name] NO.:  [id number]   MEDICAL RECORD NO.:  [id number]          PATIENT TYPE:  INP   LOCATION:  2305                         FACILITY:  [facility]   PHYSICIAN:  Salvatore Decent. Dorris Fetch, M.D.DATE OF BIRTH:  1945/08/05   DATE OF PROCEDURE:  11/22/2005  DATE OF DISCHARGE:                                 OPERATIVE REPORT   PREOPERATIVE DIAGNOSIS:  Severe three-vessel coronary disease.   POSTOPERATIVE DIAGNOSIS:  Severe three-vessel coronary disease.   PROCEDURE:  Median sternotomy, extracorporeal circulation, coronary bypass  grafting times five (left internal mammary artery to left anterior  descending, saphenous vein graft first diagonal, sequential saphenous vein  graft obtuse marginals one and two, saphenous vein graft distal right  coronary), endoscopic vein harvest right leg.   SURGEON:  Salvatore Decent. Dorris Fetch, M.D.   ASSISTANT:  Coral Ceo, P.A.   ANESTHESIA:  General.   FINDINGS:  Good-quality conduits, good quality targets.   CLINICAL NOTE:  [name] is a 65 year old gentleman who presented with  chest pain.  He underwent cardiac catheterization, was found to have severe  three-vessel coronary disease with a critical lesion in his proximal LAD.  He was advised to undergo coronary bypass grafting.  The indications,  benefits, alternatives were discussed in detail with the patient.  He  understood, accepted the risks and agreed to proceed.   OPERATIVE NOTE:  [name] was brought the preop holding area on  11/22/2005.  There lines placed by anesthesia service for monitoring  arterial central venous and pulmonary arterial pressure.  Intravenous  antibiotics were administered.  He was taken to the operating room,  anesthetized and intubated.  A Foley catheter was placed by the nursing  staff.  Chest, abdomen and legs were prepped and draped in the usual sterile  fashion.   A median sternotomy was performed and the left internal  mammary artery was  harvested using standard technique.  Simultaneously incision in the medial  aspect the right leg at the level of the knee and the greater saphenous vein  was harvested from the mid calf to the groin endoscopically.  The vein was  of good quality as was the mammary artery.  Five thousand units of heparin  was administered during the vessel harvest.  Remainder of the full heparin  dose was administered prior to dividing the distal end of the left mammary  artery.   The pericardium was opened.  The ascending aorta was inspected.  There was  no evidence of atherosclerotic disease.  The aorta was cannulated via  concentric 2-0 Ethibond pledgeted pursestring sutures.  A dual stage venous  cannula placed via pursestring suture in the right atrial appendage.  Cardiopulmonary bypass was instituted.  The patient was cooled to 32 degrees  Celsius after confirming adequate anticoagulation with ACT measurement.  Flows were maintained per protocol throughout the procedure.  The coronary  arteries were inspected and anastomotic sites were chosen.  The conduits  were inspected and cut to length.  A foam pad was placed in the pericardium  to protect the left phrenic nerve.  A temperature  probe was placed in the  myocardial septum and a cardioplegia cannula placed in the ascending aorta.   The aorta was crossclamped, left ventricle was emptied via aortic root vent.  Cardiac arrest then was achieved with combination of cold antegrade blood  cardioplegia and topical iced saline.  After achieving a complete diastolic  and arrest adequate myocardial septal cooling, the following distal  anastomoses were performed.   First a reverse saphenous vein graft was placed end-to-side to the first  diagonal branch of the LAD.  This was a 1.5-mm good-quality target.  The  vein was of good quality.  The anastomosis was performed with a running 7-0  Prolene suture.  Additional cardioplegia was  administered down the vein  graft with further septal cooling noted.  There was good hemostasis.   Next a reverse saphenous vein graft was placed end-to-side to the distal  right coronary.  This was 2 mm vessel.  There was some lateral plaquing.  The vein graft was good quality.  A probe did pass easily into the posterior  descending as well as beyond its takeoff.  The vein graft was anastomosed  end-to-side with running 7-0 Prolene suture.  Again cardioplegia was  administered to perfuse the area and inspect for hemostasis.   Next a reverse saphenous vein grafts placed sequentially to obtuse marginals  one and two.  These were both 1.5-mm good-quality targets.  The vein graft  was of good quality.  A side-to-side anastomosis was performed to OM1 end-to-  side to OM2, both were done with running 7-0 Prolene sutures.  Both were  probed proximally and distally to ensure patency.  There was excellent flow  through the graft.  Cardioplegia was administered.  There was good  hemostasis.   Next the left internal mammary artery was brought through a window in the  pericardium.  The distal end was beveled.  It was anastomosed end-to-side to  the LAD.  The LAD was a 1.5-mm good-quality target at the site of the  anastomosis.  The mammary was anastomosed end-to-side with a running 8-0  Prolene suture.  At completion of the mammary to LAD anastomosis, bulldog  clamp was briefly removed.  Immediate rapid septal rewarming was noted.  The  bulldog clamp was replaced and the mammary pedicle was tacked to the  epicardial surface of the heart with 6-0 Prolene sutures.   Additional cardioplegia was administered.  The vein grafts were cut to  length.  Cardioplegia cannulas removed from the ascending aorta.  The vein  graft to aortic anastomoses were performed to 4.5 mm punch aortotomies with running 6-0 Prolene sutures.  The aorta was normal with no significant  atherosclerosis at completion of all  proximal anastomoses the patient was  placed in Trendelenburg position.  De-airing maneuvers were performed.  The  bulldog clamp was again removed from the left mammary artery.  Lidocaine was  administered.  Aortic crossclamp was removed.  The total crossclamp time was  80 minutes.   While the patient was being rewarmed, all proximal and distal anastomoses  were inspected for hemostasis.  Epicardial pacing wires were placed on the  right ventricle, right atrium.  The patient had resumed a rhythm  spontaneously and did not require defibrillation.  He was bradycardic and  was atrially paced at 90 beats per minute after the patient had rewarmed to  a core temperature of 37 degrees Celsius, he weaned from bypass without  difficulty on the first attempt with no inotropic  support.  He was atrially  paced.  Total bypass time was 110 minutes.  Initial cardiac index greater  than 2 liters per minute per meter squared.  The patient remained  hemodynamically stable throughout post bypass period.   A test dose of protamine was administered and was well tolerated.  The  atrial and aortic cannulae were removed.  The remainder of the protamine was  administered without incident.  The chest irrigated with 1 liter of normal  saline containing 1 gram of vancomycin.  Hemostasis was achieved.  The left  pleural and two mediastinal chest tubes were placed separate subcostal  incisions.  The pericardium was reapproximated with interrupted 3-0 silk  sutures.  It came together easily without tension.  The sternum was closed  with interrupted heavy gauge stainless steel wires.  Pectoralis fascia was  closed with running #1 Vicryl suture.  The subcutaneous tissue and skin were  closed in  standard fashion.  Subcuticular closure was used for the skin.  All sponge,  needle and instrument counts were correct at the end of the procedure.  The  patient was taken from the operating room to the surgical intensive care   unit in critical but stable condition.           ______________________________  Salvatore Decent Dorris Fetch, M.D.     SCH/MEDQ  D:  11/22/2005  T:  11/23/2005  Job:  [id number]   cc:   Nicki Guadalajara, M.D.

## 2010-06-17 ENCOUNTER — Encounter: Payer: Self-pay | Admitting: Cardiovascular Disease

## 2010-07-28 ENCOUNTER — Encounter: Payer: Self-pay | Admitting: Cardiovascular Disease

## 2010-07-28 DIAGNOSIS — N400 Enlarged prostate without lower urinary tract symptoms: Secondary | ICD-10-CM | POA: Insufficient documentation

## 2010-07-28 DIAGNOSIS — R32 Unspecified urinary incontinence: Secondary | ICD-10-CM | POA: Insufficient documentation

## 2010-07-28 DIAGNOSIS — K219 Gastro-esophageal reflux disease without esophagitis: Secondary | ICD-10-CM | POA: Insufficient documentation

## 2010-11-15 ENCOUNTER — Other Ambulatory Visit: Payer: Self-pay | Admitting: Cardiovascular Disease

## 2010-11-15 NOTE — Telephone Encounter (Signed)
Pt has one more pill for tomorrow

## 2010-11-16 ENCOUNTER — Telehealth: Payer: Self-pay

## 2010-11-16 MED ORDER — RAMIPRIL 5 MG PO CAPS: 5.0000 mg | ORAL_CAPSULE | Freq: Every day | ORAL | Status: DC

## 2010-11-16 NOTE — Telephone Encounter (Signed)
Refill sent for altace. 

## 2010-12-02 ENCOUNTER — Ambulatory Visit (INDEPENDENT_AMBULATORY_CARE_PROVIDER_SITE_OTHER): Payer: Federal, State, Local not specified - PPO | Admitting: Cardiovascular Disease

## 2010-12-02 ENCOUNTER — Encounter: Payer: Self-pay | Admitting: Cardiovascular Disease

## 2010-12-02 VITALS — BP 132/78 | HR 76 | Ht 66.0 in | Wt 240.2 lb

## 2010-12-02 DIAGNOSIS — E119 Type 2 diabetes mellitus without complications: Secondary | ICD-10-CM

## 2010-12-02 DIAGNOSIS — I10 Essential (primary) hypertension: Secondary | ICD-10-CM

## 2010-12-02 DIAGNOSIS — I2510 Atherosclerotic heart disease of native coronary artery without angina pectoris: Secondary | ICD-10-CM

## 2010-12-02 DIAGNOSIS — E785 Hyperlipidemia, unspecified: Secondary | ICD-10-CM

## 2010-12-02 DIAGNOSIS — I25810 Atherosclerosis of coronary artery bypass graft(s) without angina pectoris: Secondary | ICD-10-CM

## 2010-12-02 MED ORDER — RAMIPRIL 5 MG PO CAPS: 5.0000 mg | ORAL_CAPSULE | Freq: Every day | ORAL | Status: DC

## 2010-12-02 MED ORDER — FUROSEMIDE 40 MG PO TABS: 40.0000 mg | ORAL_TABLET | Freq: Every day | ORAL | Status: DC

## 2010-12-02 MED ORDER — CARVEDILOL 25 MG PO TABS: 25.0000 mg | ORAL_TABLET | Freq: Two times a day (BID) | ORAL | Status: DC

## 2010-12-02 MED ORDER — EZETIMIBE-SIMVASTATIN 10-20 MG PO TABS: 1.0000 | ORAL_TABLET | Freq: Every day | ORAL | Status: DC

## 2010-12-02 NOTE — Assessment & Plan Note (Signed)
Ideally we would like to continue him on a full dose of vytorin 10/20. He has cut the dose in half. We have suggested we check his cholesterol. He is supposed to schedule followup with Dr. Zada Finders for routine follow up.

## 2010-12-02 NOTE — Assessment & Plan Note (Signed)
Currently with no symptoms of angina. No further workup at this time. Continue current medication regimen. 

## 2010-12-02 NOTE — Assessment & Plan Note (Signed)
We have suggested he increase the Coreg to 25 mg b.i.d. Given an elevated heart rate at home.

## 2010-12-02 NOTE — Assessment & Plan Note (Signed)
We have encouraged continued exercise, careful diet management in an effort to lose weight. 

## 2010-12-02 NOTE — Progress Notes (Signed)
Patient ID: [name], male    DOB: 16-Oct-1945, 65 y.o.   MRN: [id number]  HPI Comments: 65 year old gentleman with a history of coronary artery disease, bypass surgery x5 with a LIMA to the LAD, vein graft to the B1, vein graft to the OM1 and OM 2, vein graft to the RCA with repeat catheterization in June 2009 showing 95% stenosis of the ostial LAD with total occlusion of the proximal LAD, 70% proximal circumflex disease with occlusion of the marginal vessel from the AV groove, diffuse RCA disease with 80% proximal disease followed by 95% mid RCA disease, patent vein grafts. He has obesity, diabetes, hypertension, hyperlipidemia.     He reports that overall he has been doing well. He does have significant stress though as he was diagnosed with an acoustic neuroma. He is debating between radiation or surgical treatments.  He reports that he has been cutting back on his medications. He takes half a pill of Vytorin, takes Corag only once a day at noon and has cut back on his Lasix to once a day. He feels that his sugar levels are elevated. He continues to battle with his weight and is not do a regular exercise program.   EKG shows normal sinus rhythm with rate of 76 beats per minute with no significant ST or T wave changes        Outpatient Encounter Prescriptions as of 12/02/2010  Medication Sig Dispense Refill  . alum & mag hydroxide-simeth (MYLANTA) 200-200-20 MG/5ML suspension Take by mouth as needed.        Marland Kitchen aspirin (ASPIR-LOW) 81 MG EC tablet Take 162 mg by mouth daily.       . carvedilol (COREG) 25 MG tablet Take 1 tablet (25 mg total) by mouth 2 (two) times daily with a meal. Take 1-2 tabs (HE TAKES ONCE A DAY)  180 tablet  4  . esomeprazole (NEXIUM) 20 MG packet Take 20 mg by mouth daily.        Marland Kitchen ezetimibe-simvastatin (VYTORIN) 10-20 MG per tablet Take 1 tablet by mouth at bedtime. HE IS TAKING 1/2 A PILL  90 tablet  4  . furosemide (LASIX) 40 MG tablet Take 1 tablet (40 mg  total) by mouth daily. Take 1-2 tabs  90 tablet  4  . glyBURIDE-metformin (GLUCOVANCE) 2.5-500 MG per tablet Take 1 tablet by mouth. 2 tabs in morning and 2tab at night      . Multiple Vitamins-Minerals (MULTIVITAL) tablet Take 1 tablet by mouth daily.        . ramipril (ALTACE) 5 MG capsule Take 1 capsule (5 mg total) by mouth daily.  90 capsule  4  . sitaGLIPtin (JANUVIA) 50 MG tablet Take 50 mg by mouth daily.        . solifenacin (VESICARE) 5 MG tablet Take 10 mg by mouth daily.          Review of Systems  Constitutional: Negative.   HENT: Negative.        Hearing loss  Eyes: Negative.   Respiratory: Negative.   Cardiovascular: Negative.   Gastrointestinal: Negative.   Musculoskeletal: Negative.   Skin: Negative.   Neurological: Negative.   Hematological: Negative.   Psychiatric/Behavioral: Negative.   All other systems reviewed and are negative.    BP 132/78  Pulse 76  Ht 5\' 6"  (1.676 m)  Wt 240 lb 4 oz (108.977 kg)  BMI 38.78 kg/m2  Physical Exam  Nursing note and vitals reviewed. Constitutional: He is  oriented to person, place, and time. He appears well-developed and well-nourished.       obese  HENT:  Head: Normocephalic.  Nose: Nose normal.  Mouth/Throat: Oropharynx is clear and moist.  Eyes: Conjunctivae are normal. Pupils are equal, round, and reactive to light.  Neck: Normal range of motion. Neck supple. No JVD present.  Cardiovascular: Normal rate, regular rhythm, S1 normal, S2 normal, normal heart sounds and intact distal pulses.  Exam reveals no gallop and no friction rub.   No murmur heard. Pulmonary/Chest: Effort normal and breath sounds normal. No respiratory distress. He has no wheezes. He has no rales. He exhibits no tenderness.  Abdominal: Soft. Bowel sounds are normal. He exhibits no distension. There is no tenderness.  Musculoskeletal: Normal range of motion. He exhibits no edema and no tenderness.  Lymphadenopathy:    He has no cervical  adenopathy.  Neurological: He is alert and oriented to person, place, and time. Coordination normal.  Skin: Skin is warm and dry. No rash noted. No erythema.  Psychiatric: He has a normal mood and affect. His behavior is normal. Judgment and thought content normal.           Assessment and Plan

## 2010-12-02 NOTE — Patient Instructions (Addendum)
You are doing well. Please take coreg twice a day  Please call us if you have new issues that need to be addressed before your next appt.  The office will contact you for a follow up Appt. In 6 months

## 2011-01-04 HISTORY — PX: BASAL CELL CARCINOMA EXCISION: SHX1214

## 2011-02-02 ENCOUNTER — Ambulatory Visit: Payer: Federal, State, Local not specified - PPO | Admitting: Internal Medicine

## 2011-02-03 ENCOUNTER — Ambulatory Visit (INDEPENDENT_AMBULATORY_CARE_PROVIDER_SITE_OTHER): Payer: Federal, State, Local not specified - PPO | Admitting: Internal Medicine

## 2011-02-03 ENCOUNTER — Encounter: Payer: Self-pay | Admitting: Internal Medicine

## 2011-02-03 VITALS — BP 104/62 | HR 88 | Temp 98.0°F | Ht 66.5 in | Wt 241.0 lb

## 2011-02-03 DIAGNOSIS — E1169 Type 2 diabetes mellitus with other specified complication: Secondary | ICD-10-CM

## 2011-02-03 DIAGNOSIS — D333 Benign neoplasm of cranial nerves: Secondary | ICD-10-CM

## 2011-02-03 DIAGNOSIS — E119 Type 2 diabetes mellitus without complications: Secondary | ICD-10-CM

## 2011-02-03 DIAGNOSIS — H933X9 Disorders of unspecified acoustic nerve: Secondary | ICD-10-CM

## 2011-02-03 DIAGNOSIS — E669 Obesity, unspecified: Secondary | ICD-10-CM

## 2011-02-03 DIAGNOSIS — E785 Hyperlipidemia, unspecified: Secondary | ICD-10-CM

## 2011-02-03 DIAGNOSIS — I2510 Atherosclerotic heart disease of native coronary artery without angina pectoris: Secondary | ICD-10-CM

## 2011-02-03 LAB — COMPREHENSIVE METABOLIC PANEL
ALT: 26 U/L (ref 0–53)
AST: 25 U/L (ref 0–37)
Albumin: 3.9 g/dL (ref 3.5–5.2)
BUN: 24 mg/dL — ABNORMAL HIGH (ref 6–23)
Calcium: 9.3 mg/dL (ref 8.4–10.5)
Chloride: 101 mEq/L (ref 96–112)
GFR: 58.12 mL/min — ABNORMAL LOW (ref >60.00)
Sodium: 140 mEq/L (ref 135–145)
Total Protein: 7 g/dL (ref 6.0–8.3)

## 2011-02-03 LAB — MICROALBUMIN / CREATININE URINE RATIO
Creatinine,U: 28.2 mg/dL
Microalb Creat Ratio: 3.2 mg/g (ref 0.0–30.0)
Microalb, Ur: 0.9 mg/dL (ref 0.0–1.9)

## 2011-02-03 NOTE — Patient Instructions (Signed)
I am recommending that you eat a minium of 4 to 6  Smaller meals daily  to keep your metabolism active and burning fuel  I recommend trying the EAS  Carb Control protein shakes and the Atkins protein bars as meals/snacks when you do not have time to sit down and have a meal.    You need to walk for 15 minutes daily after your biggest meal  For exercise daily and to help your body use the sugar in your bloodstream.     If you start using these products,  You will need to suspend the Actos and reduce the glyburide to prevent low events

## 2011-02-03 NOTE — Progress Notes (Signed)
Patient ID: [name], male    DOB: Dec 27, 1945, 66 y.o.   MRN: [id number]  HPI  [name] is a  66 y o white male with a  CAD who presents for establishment of primary care.  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  He was initially treated for infection , which did not resolved the hearing loss,  So Dr. Elenore Rota  did an MRI and found the tumor. More recently, he spent the month of November and december sick with productive cough and total hearing loss on the right which has improved since being treated for otitis by Dr. Elenore Rota Jan 7 with amoxicillin and prednisone. He currently is having no symptoms of cough but continues to have hearing loss and episodes of loss of balance. He has had 2 nontraumatic falls in several weeks due to his altered sense of balance. He has been considering having corrective surgery  But is hesistant  Since treatment for acoustic neuroma is quite involved. H s second issue is a history of degenerative disc disease with prior cervical and lumbar spine surgeries which were done by neurosurgeons in [facility]. He does not have chronic pain . 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  He required insulin use during the first several years of his diagnosis of diabetes but has been non-insulin-dependent for over 2 years. He continues to take Actos daily despite concerns over the safety of this medication because he has had difficult time controlling his diabetes with other medications including Januvia. He's worried about taking Januvia sine it is relatively contraindicated in pancreatitis. His blood sugars have been elevated in the 200 range due to recent steroid use.  Says his hgba1c was fine back in the summer but has not had it checked since then. Last diabetic exam was done in 2012 June.   Dr. Evelene Croon checking PSA every September,  history of microwave procedure for BPH.  Recdeivecd flu shot,  [facility]neumov[facility]  [facility]  Normal colonoscopy 2010 by  Lutricia Feil.   Past Medical History  Diagnosis Date  . HLD (hyperlipidemia)   . CAD (coronary artery disease)   . HTN (hypertension)   . Diabetes mellitus   . Hearing loss in right ear   . Hoarseness   . AN (acoustic neuroma)    Current Outpatient Prescriptions on File Prior to Visit  Medication Sig Dispense Refill  . alum & mag hydroxide-simeth (MYLANTA) 200-200-20 MG/5ML suspension Take by mouth as needed.        Marland Kitchen aspirin (ASPIR-LOW) 81 MG EC tablet Take 162 mg by mouth daily.       . carvedilol (COREG) 25 MG tablet Take 1 tablet (25 mg total) by mouth 2 (two) times daily with a meal. Take 1-2 tabs  180 tablet  4  . esomeprazole (NEXIUM) 20 MG packet Take 20 mg by mouth daily.        Marland Kitchen ezetimibe-simvastatin (VYTORIN) 10-20 MG per tablet Take 1 tablet by mouth at bedtime.  90 tablet  4  . furosemide (LASIX) 40 MG tablet Take 1 tablet (40 mg total) by mouth daily. Take 1-2 tabs  90 tablet  4  . glyBURIDE-metformin (GLUCOVANCE) 2.5-500 MG per tablet Take 1 tablet by mouth. 2  tabs in morning and 2tab at night      . Multiple Vitamins-Minerals (MULTIVITAL) tablet Take 1 tablet by mouth daily.        . ramipril (ALTACE) 5 MG capsule Take 1 capsule (5 mg total) by mouth daily.  90 capsule  4  . solifenacin (VESICARE) 5 MG tablet Take 10 mg by mouth daily.          Review of Systems  Constitutional: Negative for fever, chills, diaphoresis, activity change, appetite change, fatigue and unexpected weight change.  HENT: Positive for hearing loss. Negative for ear pain, nosebleeds, congestion, sore throat, facial swelling, rhinorrhea, sneezing, drooling, mouth sores, trouble swallowing, neck pain, neck stiffness, dental problem, voice change, postnasal drip, sinus pressure, tinnitus and ear discharge.   Eyes:  Negative for photophobia, pain, discharge, redness, itching and visual disturbance.  Respiratory: Negative for apnea, cough, choking, chest tightness, shortness of breath, wheezing and stridor.   Cardiovascular: Negative for chest pain, palpitations and leg swelling.  Gastrointestinal: Negative for nausea, vomiting, abdominal pain, diarrhea, constipation, blood in stool, abdominal distention, anal bleeding and rectal pain.  Genitourinary: Negative for dysuria, urgency, frequency, hematuria, flank pain, decreased urine volume, scrotal swelling, difficulty urinating and testicular pain.  Musculoskeletal: Negative for myalgias, back pain, joint swelling, arthralgias and gait problem.  Skin: Negative for color change, rash and wound.  Neurological: Positive for dizziness. Negative for tremors, seizures, syncope, facial asymmetry, speech difficulty, weakness, light-headedness, numbness and headaches.  Psychiatric/Behavioral: Negative for suicidal ideas, hallucinations, behavioral problems, confusion, sleep disturbance, dysphoric mood, decreased concentration and agitation. The patient is not nervous/anxious.    BP 104/62  Pulse 88  Temp(Src) 98 F (36.7 C) (Oral)  Ht 5' 6.5" (1.689 m)  Wt 241 lb (109.317 kg)  BMI 38.32 kg/m2  SpO2 99%      Objective:   Physical Exam  Constitutional: He is oriented to person, place, and time.  HENT:  Head: Normocephalic and atraumatic.  Mouth/Throat: Oropharynx is clear and moist.  Eyes: Conjunctivae and EOM are normal.  Neck: Normal range of motion. Neck supple. No JVD present. No thyromegaly present.  Cardiovascular: Normal rate, regular rhythm and normal heart sounds.   Pulmonary/Chest: Effort normal and breath sounds normal. He has no wheezes. He has no rales.  Abdominal: Soft. Bowel sounds are normal. He exhibits no mass. There is no tenderness. There is no rebound.  Musculoskeletal: Normal range of motion. He exhibits no edema.  Neurological: He is  alert and oriented to person, place, and time.  Skin: Skin is warm and dry.  Psychiatric: He has a normal mood and affect.        Assessment & Plan:   Diabetes mellitus type 2, uncontrolled He is due for fasting labs and hemoglobin A1c as well as urine microalbumin to creatinine ratio today. He is up-to-date on diabetic eye exams as last one within the last year. We spent 10 minutes today discussing the role of diet and exercise in managing his diabetes since he is reluctant to resume insulin and I am reluctant to continue Actos.  Since his: A1c today is 8.1 and his fasting glucose is 195 I will recommend use of Levemir insulin at bedtime to help get his fasting sugars under control. I have recommended a low glycemic index diet.  CAD Managed by Dr. Erin Sons. Fasting lipids are ordered today. Goal LDL will be less than 70.  HYPERLIPIDEMIA-MIXED Managed with Vytorin with goal LDL less than 70. He is not fasting today  but will return for fasting lipids prior to next visit. No changes today  Acoustic neuroma Diagnosed by MRI several years ago, and followed by ENT and neurology at [facility]. He is considering this surgery but would like a second opinion. I recommended that he be evaluated by Dr. Lenoria Farrier and the urine see ENT team before he makes a decision. Referral made    Updated Medication List Outpatient Encounter Prescriptions as of 02/03/2011  Medication Sig Dispense Refill  . alum & mag hydroxide-simeth (MYLANTA) 200-200-20 MG/5ML suspension Take by mouth as needed.        Marland Kitchen aspirin (ASPIR-LOW) 81 MG EC tablet Take 162 mg by mouth daily.       . carvedilol (COREG) 25 MG tablet Take 1 tablet (25 mg total) by mouth 2 (two) times daily with a meal. Take 1-2 tabs  180 tablet  4  . esomeprazole (NEXIUM) 20 MG packet Take 20 mg by mouth daily.        Marland Kitchen ezetimibe-simvastatin (VYTORIN) 10-20 MG per tablet Take 1 tablet by mouth at bedtime.  90 tablet  4  . furosemide (LASIX) 40 MG tablet Take 1  tablet (40 mg total) by mouth daily. Take 1-2 tabs  90 tablet  4  . glyBURIDE-metformin (GLUCOVANCE) 2.5-500 MG per tablet Take 1 tablet by mouth. 2 tabs in morning and 2tab at night      . Multiple Vitamins-Minerals (MULTIVITAL) tablet Take 1 tablet by mouth daily.        . pioglitazone (ACTOS) 45 MG tablet Take 45 mg by mouth daily.      . ramipril (ALTACE) 5 MG capsule Take 1 capsule (5 mg total) by mouth daily.  90 capsule  4  . solifenacin (VESICARE) 5 MG tablet Take 10 mg by mouth daily.        . insulin detemir (LEVEMIR) 100 UNIT/ML injection Inject 15 Units into the skin at bedtime.  10 mL  12  . DISCONTD: sitaGLIPtin (JANUVIA) 50 MG tablet Take 50 mg by mouth daily.

## 2011-02-04 ENCOUNTER — Encounter: Payer: Self-pay | Admitting: Internal Medicine

## 2011-02-04 DIAGNOSIS — D333 Benign neoplasm of cranial nerves: Secondary | ICD-10-CM | POA: Insufficient documentation

## 2011-02-04 MED ORDER — INSULIN DETEMIR 100 UNIT/ML ~~LOC~~ SOLN: 15.0000 [IU] | Freq: Every day | SUBCUTANEOUS | Status: DC

## 2011-02-04 NOTE — Assessment & Plan Note (Signed)
Managed by Dr. Erin Sons. Fasting lipids are ordered today. Goal LDL will be less than 70.

## 2011-02-04 NOTE — Assessment & Plan Note (Addendum)
He is due for fasting labs and hemoglobin A1c as well as urine microalbumin to creatinine ratio today. He is up-to-date on diabetic eye exams as last one within the last year. We spent 10 minutes today discussing the role of diet and exercise in managing his diabetes since he is reluctant to resume insulin and I am reluctant to continue Actos.  Since his: A1c today is 8.1 and his fasting glucose is 195 I will recommend use of Levemir insulin at bedtime to help get his fasting sugars under control. I have recommended a low glycemic index diet.

## 2011-02-04 NOTE — Assessment & Plan Note (Signed)
Managed with Vytorin with goal LDL less than 70. He is not fasting today but will return for fasting lipids prior to next visit. No changes today

## 2011-02-04 NOTE — Assessment & Plan Note (Signed)
Diagnosed by MRI several years ago, and followed by ENT and neurology at [facility]. He is considering this surgery but would like a second opinion. I recommended that he be evaluated by Dr. Lenoria Farrier and the urine see ENT team before he makes a decision. Referral made

## 2011-02-07 ENCOUNTER — Ambulatory Visit (INDEPENDENT_AMBULATORY_CARE_PROVIDER_SITE_OTHER): Payer: Federal, State, Local not specified - PPO | Admitting: *Deleted

## 2011-02-07 VITALS — BP 132/74

## 2011-02-07 DIAGNOSIS — IMO0002 Reserved for concepts with insufficient information to code with codable children: Secondary | ICD-10-CM

## 2011-02-07 DIAGNOSIS — E1165 Type 2 diabetes mellitus with hyperglycemia: Secondary | ICD-10-CM

## 2011-02-07 DIAGNOSIS — IMO0001 Reserved for inherently not codable concepts without codable children: Secondary | ICD-10-CM

## 2011-02-07 MED ORDER — INSULIN PEN NEEDLE 31G X 6 MM MISC: Status: DC

## 2011-02-07 NOTE — Patient Instructions (Addendum)
Take Levemir 15 units at bedtime every day Submit blood sugar readings every 2 weeks. Send either by MyChart OR drop the list by the office for review. Please contact our office with any questions.

## 2011-02-07 NOTE — Progress Notes (Signed)
Spent 20 minutes with patient going over in detail directions for levemir flex pen. Explained self injection techniques and how to use insulin pen. Rx sent into pharm for pen needles. Also discussed need to check blood sugar and report to the office in 2 wks with readings to ensure that dose of insulin was adequate.   Also reviewed lab results with patient, he will have f/u labs including lipids in 3 mths.   Pt understood and left office with no further questions.

## 2011-02-18 ENCOUNTER — Telehealth: Payer: Self-pay | Admitting: *Deleted

## 2011-02-18 DIAGNOSIS — R42 Dizziness and giddiness: Secondary | ICD-10-CM

## 2011-02-18 DIAGNOSIS — E669 Obesity, unspecified: Secondary | ICD-10-CM

## 2011-02-18 DIAGNOSIS — E1169 Type 2 diabetes mellitus with other specified complication: Secondary | ICD-10-CM

## 2011-02-18 NOTE — Telephone Encounter (Signed)
Pt has dropped off a list of his blood sugar readings, this is in red folder.  He wants to know when he should check his blood sugar.

## 2011-02-21 MED ORDER — INSULIN DETEMIR 100 UNIT/ML ~~LOC~~ SOLN: 18.0000 [IU] | Freq: Every day | SUBCUTANEOUS | Status: DC

## 2011-02-21 NOTE — Telephone Encounter (Signed)
His readings have improved slowly over the 2 week period.  He only needs to eat every 3 hours while awake!  So the first morning  should be considered his fastings and they are mostly in range.  I would like him to increase his Levemir dose to 18 units and leave his glyburide /metformin dose where it is.

## 2011-02-21 NOTE — Telephone Encounter (Signed)
Left message on machine for pt to call back.

## 2011-02-22 NOTE — Telephone Encounter (Signed)
Advised patient as instructed. 

## 2011-03-10 ENCOUNTER — Telehealth: Payer: Self-pay | Admitting: Internal Medicine

## 2011-03-10 MED ORDER — GLYBURIDE-METFORMIN 2.5-500 MG PO TABS: ORAL_TABLET | ORAL | Status: DC

## 2011-03-10 NOTE — Telephone Encounter (Signed)
His blood sugars are continuing to imporve.  I would like to stop the insulin and increase the glyburide/metformin dose to twice daily.  If he is already taking it twice daily,  Then increase it to 2 tablets in the morning,  One tablet in the evening.   Will review sugars in in two weeks.

## 2011-03-10 NOTE — Telephone Encounter (Signed)
Notified patient of message.  He stated he is currently taking 2 tablets two times a day of the glyburide/metformin.  Please advise on what you would like him to do.

## 2011-03-10 NOTE — Telephone Encounter (Signed)
Have him increase the morning dose to 3 tablets ,  continue 2 in the evening,  And stop the insulin

## 2011-03-10 NOTE — Telephone Encounter (Signed)
Notified patient of message.

## 2011-05-09 ENCOUNTER — Encounter: Payer: Self-pay | Admitting: Cardiovascular Disease

## 2011-05-09 ENCOUNTER — Ambulatory Visit (INDEPENDENT_AMBULATORY_CARE_PROVIDER_SITE_OTHER): Payer: Federal, State, Local not specified - PPO | Admitting: Cardiovascular Disease

## 2011-05-09 VITALS — BP 106/64 | HR 80 | Ht 66.0 in | Wt 252.2 lb

## 2011-05-09 DIAGNOSIS — IMO0002 Reserved for concepts with insufficient information to code with codable children: Secondary | ICD-10-CM

## 2011-05-09 DIAGNOSIS — I2510 Atherosclerotic heart disease of native coronary artery without angina pectoris: Secondary | ICD-10-CM

## 2011-05-09 DIAGNOSIS — I25810 Atherosclerosis of coronary artery bypass graft(s) without angina pectoris: Secondary | ICD-10-CM

## 2011-05-09 DIAGNOSIS — IMO0001 Reserved for inherently not codable concepts without codable children: Secondary | ICD-10-CM

## 2011-05-09 DIAGNOSIS — E1165 Type 2 diabetes mellitus with hyperglycemia: Secondary | ICD-10-CM

## 2011-05-09 DIAGNOSIS — E785 Hyperlipidemia, unspecified: Secondary | ICD-10-CM

## 2011-05-09 DIAGNOSIS — I10 Essential (primary) hypertension: Secondary | ICD-10-CM

## 2011-05-09 MED ORDER — RAMIPRIL 5 MG PO CAPS: 5.0000 mg | ORAL_CAPSULE | Freq: Every day | ORAL | Status: DC

## 2011-05-09 NOTE — Progress Notes (Signed)
Patient ID: [name], male    DOB: Aug 04, 1945, 66 y.o.   MRN: [id number]  HPI Comments: 66 year old gentleman with a history of coronary artery disease, bypass surgery x5 with a LIMA to the LAD, vein graft to the D1, vein graft to the OM1 and OM 2, vein graft to the RCA with repeat catheterization in June 2009 showing 95% stenosis of the ostial LAD with total occlusion of the proximal LAD, 70% proximal circumflex disease with occlusion of the marginal vessel from the AV groove, diffuse RCA disease with 80% proximal disease followed by 95% mid RCA disease, patent vein grafts. He has obesity, diabetes, hypertension, hyperlipidemia.   He has an acoustic neuroma. He continues to debate between pursuing surgery or radiation.  He reports that he has been doing well. He does report having a recent fall after a lightheaded episode. Uncertain if he lost his balance from mechanical reasons. He has been having difficulty losing weight. Sugars have been elevated. He reports receiving mixed signals about whether to stay on insulin. He has not had a followup with Dr. Darrick Huntsman to discuss this. He does measure his blood pressure when he is out of the house and reports having systolic pressures 140. He continues to take carvedilol 25 mg in the morning, no evening dose. Also with ramipril in the morning He is now taking a full Vytorin 10/20 mg. He does not do a regular exercise program.   EKG shows normal sinus rhythm with rate of 80 beats per minute with no significant ST or T wave changes        Outpatient Encounter Prescriptions as of 05/09/2011  Medication Sig Dispense Refill  . alum & mag hydroxide-simeth (MYLANTA) 200-200-20 MG/5ML suspension Take by mouth as needed.        Marland Kitchen aspirin (ASPIR-LOW) 81 MG EC tablet Take 162 mg by mouth daily.       . carvedilol (COREG) 25 MG tablet Take 1 tablet (25 mg total) by mouth 2 (two) times daily with a meal. Take 1-2 tabs  180 tablet  4  . esomeprazole (NEXIUM) 20 MG  packet Take 20 mg by mouth daily.        Marland Kitchen ezetimibe-simvastatin (VYTORIN) 10-20 MG per tablet Take 1 tablet by mouth at bedtime.  90 tablet  4  . furosemide (LASIX) 40 MG tablet Take 1 tablet (40 mg total) by mouth daily. Take 1-2 tabs  90 tablet  4  . glyBURIDE-metformin (GLUCOVANCE) 2.5-500 MG per tablet 2 tablets In the evening,  3 tablets in the morning  150 tablet  0  . insulin detemir (LEVEMIR) 100 UNIT/ML injection Inject 18 Units into the skin at bedtime.  10 mL  12  . Insulin Pen Needle 31G X 6 MM MISC Use daily with levemir  50 each  0  . Multiple Vitamins-Minerals (MULTIVITAL) tablet Take 1 tablet by mouth daily.        . pioglitazone (ACTOS) 45 MG tablet Take 45 mg by mouth daily.      . ramipril (ALTACE) 5 MG capsule Take 1 capsule (5 mg total) by mouth daily.  90 capsule  4  . solifenacin (VESICARE) 5 MG tablet Take 10 mg by mouth daily.         Review of Systems  Constitutional: Negative.   HENT: Negative.        Hearing loss  Eyes: Negative.   Respiratory: Negative.   Cardiovascular: Negative.   Gastrointestinal: Negative.   Musculoskeletal: Negative.  Skin: Negative.   Neurological: Positive for light-headedness.  Hematological: Negative.   Psychiatric/Behavioral: Negative.   All other systems reviewed and are negative.    BP 106/64  Ht 5\' 6"  (1.676 m)  Wt 252 lb 4 oz (114.42 kg)  BMI 40.71 kg/m2  Physical Exam  Nursing note and vitals reviewed. Constitutional: He is oriented to person, place, and time. He appears well-developed and well-nourished.       obese  HENT:  Head: Normocephalic.  Nose: Nose normal.  Mouth/Throat: Oropharynx is clear and moist.  Eyes: Conjunctivae are normal. Pupils are equal, round, and reactive to light.  Neck: Normal range of motion. Neck supple. No JVD present.  Cardiovascular: Normal rate, regular rhythm, S1 normal, S2 normal, normal heart sounds and intact distal pulses.  Exam reveals no gallop and no friction rub.   No  murmur heard. Pulmonary/Chest: Effort normal and breath sounds normal. No respiratory distress. He has no wheezes. He has no rales. He exhibits no tenderness.  Abdominal: Soft. Bowel sounds are normal. He exhibits no distension. There is no tenderness.  Musculoskeletal: Normal range of motion. He exhibits no edema and no tenderness.  Lymphadenopathy:    He has no cervical adenopathy.  Neurological: He is alert and oriented to person, place, and time. Coordination normal.  Skin: Skin is warm and dry. No rash noted. No erythema.  Psychiatric: He has a normal mood and affect. His behavior is normal. Judgment and thought content normal.           Assessment and Plan

## 2011-05-09 NOTE — Assessment & Plan Note (Signed)
Blood pressure is low today. Recent episode of lightheadedness, possibly contributing to a fall? Uncertain if this was mechanical as he reports his balance is poor, possibly from the neuroma. We have suggested he take or egg 12.5 mg twice a day not 25 mg in the morning. He could take ramipril later in the day.

## 2011-05-09 NOTE — Assessment & Plan Note (Signed)
Currently with no symptoms of angina. No further workup at this time. Continue current medication regimen. 

## 2011-05-09 NOTE — Assessment & Plan Note (Signed)
We have encouraged continued exercise, careful diet management in an effort to lose weight. We have encouraged him to followup with Dr. Darrick Huntsman.

## 2011-05-09 NOTE — Assessment & Plan Note (Signed)
We have suggested he stay on the full dose Vytorin 10/20 mg daily. Goal LDL less than 70.

## 2011-05-09 NOTE — Patient Instructions (Signed)
You are doing well.  please take 1/2 pill of coreg twice a day Monitor blood pressure If you have low blood pressure or more dizzy episodes, call the office  Please call us if you have new issues that need to be addressed before your next appt.  Your physician wants you to follow-up in: 6 months.  You will receive a reminder letter in the mail two months in advance. If you don't receive a letter, please call our office to schedule the follow-up appointment.

## 2011-06-24 ENCOUNTER — Other Ambulatory Visit: Payer: Self-pay | Admitting: Internal Medicine

## 2011-06-24 MED ORDER — GLYBURIDE-METFORMIN 2.5-500 MG PO TABS: ORAL_TABLET | ORAL | Status: DC

## 2011-07-14 ENCOUNTER — Encounter: Payer: Self-pay | Admitting: Internal Medicine

## 2011-07-14 ENCOUNTER — Ambulatory Visit (INDEPENDENT_AMBULATORY_CARE_PROVIDER_SITE_OTHER): Payer: Federal, State, Local not specified - PPO | Admitting: Internal Medicine

## 2011-07-14 VITALS — BP 112/62 | HR 76 | Temp 98.4°F | Resp 16 | Wt 250.2 lb

## 2011-07-14 DIAGNOSIS — E1169 Type 2 diabetes mellitus with other specified complication: Secondary | ICD-10-CM

## 2011-07-14 DIAGNOSIS — H9191 Unspecified hearing loss, right ear: Secondary | ICD-10-CM | POA: Insufficient documentation

## 2011-07-14 DIAGNOSIS — E669 Obesity, unspecified: Secondary | ICD-10-CM

## 2011-07-14 DIAGNOSIS — IMO0002 Reserved for concepts with insufficient information to code with codable children: Secondary | ICD-10-CM

## 2011-07-14 DIAGNOSIS — E119 Type 2 diabetes mellitus without complications: Secondary | ICD-10-CM

## 2011-07-14 DIAGNOSIS — E1165 Type 2 diabetes mellitus with hyperglycemia: Secondary | ICD-10-CM

## 2011-07-14 DIAGNOSIS — I2510 Atherosclerotic heart disease of native coronary artery without angina pectoris: Secondary | ICD-10-CM | POA: Insufficient documentation

## 2011-07-14 DIAGNOSIS — IMO0001 Reserved for inherently not codable concepts without codable children: Secondary | ICD-10-CM

## 2011-07-14 DIAGNOSIS — D333 Benign neoplasm of cranial nerves: Secondary | ICD-10-CM | POA: Insufficient documentation

## 2011-07-14 DIAGNOSIS — Z8719 Personal history of other diseases of the digestive system: Secondary | ICD-10-CM | POA: Insufficient documentation

## 2011-07-14 LAB — COMPLETE METABOLIC PANEL WITH GFR
ALT: 20 U/L (ref 0–53)
AST: 22 U/L (ref 0–37)
Albumin: 4.4 g/dL (ref 3.5–5.2)
Alkaline Phosphatase: 71 U/L (ref 39–117)
BUN: 24 mg/dL — ABNORMAL HIGH (ref 6–23)
Creat: 1.27 mg/dL (ref 0.50–1.35)
Potassium: 4.5 mEq/L (ref 3.5–5.3)
Total Bilirubin: 0.5 mg/dL (ref 0.3–1.2)
Total Protein: 7.1 g/dL (ref 6.0–8.3)

## 2011-07-14 LAB — HEMOGLOBIN A1C: Hgb A1c MFr Bld: 7 % — ABNORMAL HIGH (ref 4.6–6.5)

## 2011-07-14 MED ORDER — SOLIFENACIN SUCCINATE 10 MG PO TABS: 10.0000 mg | ORAL_TABLET | Freq: Every day | ORAL | Status: DC

## 2011-07-14 NOTE — Progress Notes (Signed)
Patient ID: [name], male   DOB: [birth year], 66 y.o.   MRN: [id number] Patient Active Problem List  Diagnosis  . Diabetes mellitus type 2, uncontrolled  . HYPERLIPIDEMIA-MIXED  . HYPERTENSION, BENIGN  . CAD  . CAD, ARTERY BYPASS GRAFT  . Acoustic neuroma  . History of pancreatitis  . Diabetes mellitus  . CAD (coronary artery disease)  . AN (acoustic neuroma)  . Hearing loss in right ear  . Obesity, Class III, BMI 40-49.9 (morbid obesity)  . Obesity    Subjective:  CC:   Chief Complaint  Patient presents with  . Follow-up    HPI:   [name] a 66 y.o. male who presents  Follow up on diabetes mellitus,.  Last seen in January.  Insulin was stopped in March by phone due to several low blood sugars .  Continues to take Actos.  For the past month his sugars have been elevated, from 130 to 180,  None below 80 but he did have to sugars in the 80s which caused a low blood sugar reaction of tachycardia and night sweat. He has gained weight.    Past Medical History  Diagnosis Date  . HLD (hyperlipidemia)   . CAD (coronary artery disease)     multivessel diffuse disease  . HTN (hypertension)   . Hearing loss in right ear   . Hoarseness   . AN (acoustic neuroma) 2012    by [facility], [place]  . History of pancreatitis 1996    idiopathic  . Diabetes mellitus     secondary to pancreatitis    Past Surgical History  Procedure Date  . Coronary artery bypass graft NOV 2007    x5  . Carpal tunnel release   . Back surgery   . Cyst removal of right hand   . Ruptured disk surgery     neck   . Coronary angioplasty with stent placement     The following portions of the patient's history were reviewed and updated as appropriate: Allergies, current medications, and problem list.    Review of Systems:  Comprehensive  review of systems was positive for occasional dizziness,  Otherwise negative except those addressed in the HPI,     History   Social History  .  Marital Status: Married    Spouse Name: N/A    Number of Children: N/A  . Years of Education: N/A   Occupational History  . Not on file.   Social History Main Topics  . Smoking status: Never Smoker   . Smokeless tobacco: Never Used   Comment: tobacco use- no   . Alcohol Use: No  . Drug Use: No  . Sexually Active: Not on file   Other Topics Concern  . Not on file   Social History Narrative   Does not regularly exercise. Works part time at [employer] 2-3 hours at night.     Objective:  BP 112/62  Pulse 76  Temp 98.4 F (36.9 C) (Oral)  Resp 16  Wt 250 lb 4 oz (113.513 kg)  SpO2 93%  General appearance: alert, cooperative and appears stated age Ears: normal TM's and external ear canals both ears Throat: lips, mucosa, and tongue normal; teeth and gums normal Neck: no adenopathy, no carotid bruit, supple, symmetrical, trachea midline and thyroid not enlarged, symmetric, no tenderness/mass/nodules Back: symmetric, no curvature. ROM normal. No CVA tenderness. Lungs: clear to auscultation bilaterally Heart: regular rate and rhythm, S1, S2 normal, no murmur, click, rub or gallop Abdomen:  soft, non-tender; bowel sounds normal; no masses,  no organomegaly Pulses: 2+ and symmetric Skin: Skin color, texture, turgor normal. No rashes or lesions Lymph nodes: Cervical, supraclavicular, and axillary nodes normal. Foot exam:  Nails are well trimmed,  No callouses,  Sensation intact to microfilament  Assessment and Plan:  Obesity, Class III, BMI 40-49.9 (morbid obesity) I have addressed  BMI and recommended a low glycemic index diet utilizing smaller more frequent meals to increase metabolism.  I have also recommended that patient start exercising with a goal of 30 minutes of aerobic exercise a minimum of 5 days per week.     Diabetes mellitus type 2, uncontrolled Now controlled with glyburide/metformin and Actos,  hgba1c improved from 8.1 to 7.0  However, I have discussed my  concern over th safety profile of Actos given his significant CAD and have recommended stopping the Actos which will necessitate resuming some form of insulin, given the risks of newer oral medications with pancreatitis history . I favor the use of a low glycemicn index diet and basal insulin.   Obesity I have addressed  BMI and recommended a low glycemic index diet utilizing smaller more frequent meals to increase metabolism.  I have also recommended that patient start exercising with a goal of 30 minutes of aerobic exercise a minimum of 5 days per week.     Updated Medication List Outpatient Encounter Prescriptions as of 07/14/2011  Medication Sig Dispense Refill  . alum & mag hydroxide-simeth (MYLANTA) 200-200-20 MG/5ML suspension Take by mouth as needed.        Marland Kitchen aspirin (ASPIR-LOW) 81 MG EC tablet Take 162 mg by mouth daily.       . carvedilol (COREG) 25 MG tablet Take 12.5 mg by mouth 2 (two) times daily with a meal. Take 1-2 tabs      . esomeprazole (NEXIUM) 20 MG packet Take 20 mg by mouth daily.        Marland Kitchen ezetimibe-simvastatin (VYTORIN) 10-20 MG per tablet Take 1 tablet by mouth at bedtime.  90 tablet  4  . furosemide (LASIX) 40 MG tablet Take 1 tablet (40 mg total) by mouth daily. Take 1-2 tabs  90 tablet  4  . glyBURIDE-metformin (GLUCOVANCE) 2.5-500 MG per tablet 2 tablets In the evening,  3 tablets in the morning  450 tablet  2  . Multiple Vitamins-Minerals (MULTIVITAL) tablet Take 1 tablet by mouth daily.        . ramipril (ALTACE) 5 MG capsule Take 1 capsule (5 mg total) by mouth daily.  90 capsule  4  . solifenacin (VESICARE) 10 MG tablet Take 1 tablet (10 mg total) by mouth daily.  90 tablet  3  . DISCONTD: carvedilol (COREG) 25 MG tablet Take 1 tablet (25 mg total) by mouth 2 (two) times daily with a meal. Take 1-2 tabs  180 tablet  4  . DISCONTD: pioglitazone (ACTOS) 45 MG tablet Take 45 mg by mouth daily.      Marland Kitchen DISCONTD: solifenacin (VESICARE) 10 MG tablet Take 5 mg by mouth  daily.      . insulin detemir (LEVEMIR) 100 UNIT/ML injection Inject 10 Units into the skin at bedtime.  10 mL  12  . DISCONTD: insulin detemir (LEVEMIR) 100 UNIT/ML injection Inject 18 Units into the skin at bedtime.  10 mL  12  . DISCONTD: solifenacin (VESICARE) 5 MG tablet Take 10 mg by mouth daily.           Orders Placed This Encounter  Procedures  . COMPLETE METABOLIC PANEL WITH GFR  . Hemoglobin A1c  . Microalbumin / creatinine urine ratio  . HM DIABETES FOOT EXAM  . HM COLONOSCOPY    Return in about 3 months (around 10/14/2011).

## 2011-07-14 NOTE — Assessment & Plan Note (Addendum)
Now controlled with glyburide/metformin and Actos,  hgba1c improved from 8.1 to 7.0  However, I have discussed my concern over th safety profile of Actos given his significant CAD and have recommended stopping the Actos which will necessitate resuming some form of insulin, given the risks of newer oral medications with pancreatitis history . I favor the use of a low glycemicn index diet and basal insulin.

## 2011-07-14 NOTE — Assessment & Plan Note (Signed)
I have addressed  BMI and recommended a low glycemic index diet utilizing smaller more frequent meals to increase metabolism.  I have also recommended that patient start exercising with a goal of 30 minutes of aerobic exercise a minimum of 5 days per week.  

## 2011-07-14 NOTE — Patient Instructions (Addendum)
Consider a Low Glycemic Index Diet and eating 6 smaller meals daily .  This frequent feeding stimulates your metabolism and the lower glycemic index foods will lower your blood sugars:   This is an example of a "Low GI"  Diet:  All of the foods can be found at grocery stores and in bulk at [employer]  club   7 AM Breakfast:  Low carbohydrate Protein  Shakes (I recommend the [employer] AdvantEdge "Carb Control" shakes  Or the low carb shakes by Atkins.   Both are available everywhere:  In  cases at [employer]  Or in 4 packs at grocery stores and pharmacies  2.5 carbs  (Alternative is  a toasted Arnold's Sandwhich Thin w/ peanut butter, a Begel Thin with cream cheese and salmon) or  a scrambled egg burrito made with a low carb tortilla .  Avoid cereal and bananas, oatmeal too!   10 AM: Protein bar by Atkins (the snack size,  Many varieties , available widely again)    Lunch: sandwich of [place] avocado and cheese on a lower carbohydrate pita bread, flatbread, or tortilla   (Joseph's makes a pita bread and a flat bread  50 cal and 4 net carbs ; Toufayan makes a low carb flatbread 100 cal and 9 net carbs ) and  [place]ission makes a low carb whole wheat tortilla  210 cal and 6 net carbs  3 PM:  Mid day :  Another protein bar,  Or a  cheese stick,  Or 1 ounce of  almonds, walnuts, pistachios, pecans, peanuts,  Macadamia nuts. Or low GI fruit serving: cherries,  Berries, whipped cream  6 PM  Dinner:  "mean and green:"  Meat/chicken/fish or a high protein legume; , with a green salad, and a low GI  Veggie (broccoli, cauliflower, green beans, spinach, brussel sprouts. Lima beans) : Avoid "Low fat dressings! They are loaded with sugar! Instead use ranch, vinagrette,  Blue cheese, etc  9 PM snack : Breyer's "low carb" fudgsicle or  ice cream bar (Carb Smart), or  Weight Watcher's ice cream bar , or another protein shake or a serving of fresh fruit with whipped cream (Avoid bananas, pineapple, grapes  and watermelon on a regular basis  because they are high in sugar   Remember that snack Substitutions should be less than 15 to 20 carbs  Per serving. Remember to subtract fiber grams to get the "net carbs."  Try Eucerin skin cream to moisturize skin after shower

## 2011-07-15 ENCOUNTER — Telehealth: Payer: Self-pay | Admitting: Internal Medicine

## 2011-07-15 DIAGNOSIS — E669 Obesity, unspecified: Secondary | ICD-10-CM | POA: Insufficient documentation

## 2011-07-15 LAB — MICROALBUMIN / CREATININE URINE RATIO
Creatinine,U: 17.2 mg/dL
Microalb, Ur: 0.1 mg/dL (ref 0.0–1.9)

## 2011-07-15 MED ORDER — INSULIN DETEMIR 100 UNIT/ML ~~LOC~~ SOLN: 10.0000 [IU] | Freq: Every day | SUBCUTANEOUS | Status: DC

## 2011-07-15 NOTE — Telephone Encounter (Signed)
Correction.  He was on levemir according to chart.  Have him resume with new pen,  levemir 10 units daily

## 2011-07-15 NOTE — Assessment & Plan Note (Signed)
I have addressed  BMI and recommended a low glycemic index diet utilizing smaller more frequent meals to increase metabolism.  I have also recommended that patient start exercising with a goal of 30 minutes of aerobic exercise a minimum of 5 days per week.  

## 2011-07-15 NOTE — Telephone Encounter (Signed)
Patient notified through result notes.

## 2011-10-14 ENCOUNTER — Other Ambulatory Visit: Payer: Self-pay

## 2011-10-14 ENCOUNTER — Other Ambulatory Visit: Payer: Self-pay | Admitting: Internal Medicine

## 2011-10-14 ENCOUNTER — Ambulatory Visit (INDEPENDENT_AMBULATORY_CARE_PROVIDER_SITE_OTHER): Payer: Federal, State, Local not specified - PPO | Admitting: Internal Medicine

## 2011-10-14 ENCOUNTER — Encounter: Payer: Self-pay | Admitting: Internal Medicine

## 2011-10-14 VITALS — BP 118/64 | HR 78 | Temp 97.8°F | Ht 67.5 in | Wt 252.5 lb

## 2011-10-14 DIAGNOSIS — D333 Benign neoplasm of cranial nerves: Secondary | ICD-10-CM

## 2011-10-14 DIAGNOSIS — E785 Hyperlipidemia, unspecified: Secondary | ICD-10-CM

## 2011-10-14 DIAGNOSIS — IMO0001 Reserved for inherently not codable concepts without codable children: Secondary | ICD-10-CM

## 2011-10-14 DIAGNOSIS — E669 Obesity, unspecified: Secondary | ICD-10-CM

## 2011-10-14 DIAGNOSIS — E1165 Type 2 diabetes mellitus with hyperglycemia: Secondary | ICD-10-CM

## 2011-10-14 DIAGNOSIS — IMO0002 Reserved for concepts with insufficient information to code with codable children: Secondary | ICD-10-CM

## 2011-10-14 LAB — COMPREHENSIVE METABOLIC PANEL
AST: 19 U/L (ref 0–37)
Albumin: 3.6 g/dL (ref 3.5–5.2)
BUN: 24 mg/dL — ABNORMAL HIGH (ref 6–23)
Calcium: 8.6 mg/dL (ref 8.4–10.5)
Chloride: 102 mEq/L (ref 96–112)
Creatinine, Ser: 1.3 mg/dL (ref 0.4–1.5)
GFR: 61.22 mL/min (ref >60.00)
Glucose, Bld: 129 mg/dL — ABNORMAL HIGH (ref 70–99)

## 2011-10-14 LAB — LIPID PANEL
Cholesterol: 159 mg/dL (ref 0–200)
LDL Cholesterol: 75 mg/dL (ref 0–99)
Triglycerides: 186 mg/dL — ABNORMAL HIGH (ref 0.0–149.0)

## 2011-10-14 LAB — HEMOGLOBIN A1C: Hgb A1c MFr Bld: 6.9 % — ABNORMAL HIGH (ref 4.6–6.5)

## 2011-10-14 MED ORDER — ACCU-CHEK SOFT TOUCH LANCETS MISC: Status: AC

## 2011-10-14 MED ORDER — GLYBURIDE MICRONIZED 1.5 MG PO TABS: 1.5000 mg | ORAL_TABLET | Freq: Every day | ORAL | Status: DC

## 2011-10-14 MED ORDER — METFORMIN HCL 1000 MG PO TABS: 1000.0000 mg | ORAL_TABLET | Freq: Two times a day (BID) | ORAL | Status: DC

## 2011-10-14 NOTE — Progress Notes (Signed)
Patient ID: [name], male   DOB: 1945/12/14, 66 y.o.   MRN: [id number]   Patient Active Problem List  Diagnosis  . Diabetes mellitus type 2, uncontrolled  . HYPERLIPIDEMIA-MIXED  . HYPERTENSION, BENIGN  . CAD  . CAD, ARTERY BYPASS GRAFT  . Acoustic neuroma  . History of pancreatitis  . Diabetes mellitus  . CAD (coronary artery disease)  . Obesity    Subjective:  CC:   Chief Complaint  Patient presents with  . Follow-up    HPI:   [name] a 66 y.o. male who presents  Three-month followup on diabetes mellitus, hyperlipidemia, and obesity.  Person she started the Nutrisystem diet 10 days ago and since he has been on it his blood sugars have improved. He has had several hypoglycemic events/  The hypoglycemic events are occurring in the evening before dinner. He did not start the [name] after the last visit. He has decreased his glyburide/metformin combination dose from 3 in the morning and 2 in the evening to 2 in the morning and 2 at night. He has not lost any weight compared to his weight 3 months ago. He states that he tried the  low glycemic index diet but gained weight on it. He is not exercising due to the stress of taking care of several family members and visiting a sister who has been in the hospital. He traveled to [facility] recently for a visit with a neurosurgeon who specializes in acoustic nueromas, and during the visit he had 2 miinor falls onto his left knee while descending a staircase. He developed pain and effusion on the knee and has had this aspirated by an orthopedist at the [facility].  3) Insomnia, His sleep has been disrupted by tinnitus.     Past Medical History  Diagnosis Date  . HLD (hyperlipidemia)   . CAD (coronary artery disease)     multivessel diffuse disease  . HTN (hypertension)   . Hearing loss in right ear   . Hoarseness   . AN (acoustic neuroma) 2012    by MRi, [name]  . History of pancreatitis 1996    idiopathic  .  Diabetes mellitus     secondary to pancreatitis    Past Surgical History  Procedure Date  . Coronary artery bypass graft NOV 2007    x5  . Carpal tunnel release   . Back surgery   . Cyst removal of right hand   . Ruptured disk surgery     neck   . Coronary angioplasty with stent placement          The following portions of the patient's history were reviewed and updated as appropriate: Allergies, current medications, and problem list.    Review of Systems:   12 Pt  review of systems was negative except those addressed in the HPI,     History   Social History  . Marital Status: Married    Spouse Name: N/A    Number of Children: N/A  . Years of Education: N/A   Occupational History  . Not on file.   Social History Main Topics  . Smoking status: Never Smoker   . Smokeless tobacco: Never Used   Comment: tobacco use- no   . Alcohol Use: No  . Drug Use: No  . Sexually Active: Not on file   Other Topics Concern  . Not on file   Social History Narrative   Does not regularly exercise. Works part time  at [employer] 2-3 hours at night.     Objective:  BP 118/64  Pulse 78  Temp 97.8 F (36.6 C) (Oral)  Ht 5' 7.5" (1.715 m)  Wt 252 lb 8 oz (114.533 kg)  BMI 38.96 kg/m2  SpO2 96%  General appearance: alert, cooperative and appears stated age Ears: normal TM's and external ear canals both ears Throat: lips, mucosa, and tongue normal; teeth and gums normal Neck: no adenopathy, no carotid bruit, supple, symmetrical, trachea midline and thyroid not enlarged, symmetric, no tenderness/mass/nodules Back: symmetric, no curvature. ROM normal. No CVA tenderness. Lungs: clear to auscultation bilaterally Heart: regular rate and rhythm, S1, S2 normal, no murmur, click, rub or gallop Abdomen: soft, non-tender; bowel sounds normal; no masses,  no organomegaly Pulses: 2+ and symmetric Skin: Skin color, texture, turgor normal. No rashes or lesions Lymph nodes:  Cervical, supraclavicular, and axillary nodes normal.  Assessment and Plan:  Diabetes mellitus type 2, uncontrolled His his diabetes has come under better control with dietary modification.  I have decreased his glyburide dose to 1.5 mg twice daily given his adoption of the Nutrisystem diet. His current hemoglobin A1c is 6.9. He continue 1000 mg twice daily of metformin.   Acoustic neuroma He is undecided about surgical treatment of this schwannoma. He has attended a conference in [place] recently N. met with a specialist. He is having nightly tinnitus which is affecting sleep. I have urged him to consider  definitive therapy.  HYPERLIPIDEMIA-MIXED LDL of 75 currently. No changes to her regimen.  Obesity I have addressed  BMI and recommended a low glycemic index diet utilizing smaller more frequent meals to increase metabolism.  I have also recommended that patient start exercising with a goal of 30 minutes of aerobic exercise a minimum of 5 days per week.    Updated Medication List Outpatient Encounter Prescriptions as of 10/14/2011  Medication Sig Dispense Refill  . aspirin (ASPIR-LOW) 81 MG EC tablet Take 162 mg by mouth daily.       . carvedilol (COREG) 25 MG tablet Take 12.5 mg by mouth 2 (two) times daily with a meal. Take 1-2 tabs      . esomeprazole (NEXIUM) 20 MG packet Take 20 mg by mouth daily.        Marland Kitchen ezetimibe-simvastatin (VYTORIN) 10-20 MG per tablet Take 1 tablet by mouth at bedtime.  90 tablet  4  . furosemide (LASIX) 40 MG tablet Take 1 tablet (40 mg total) by mouth daily. Take 1-2 tabs  90 tablet  4  . hydrocortisone (ANUCORT-HC) 25 MG suppository Place 25 mg rectally as needed.      . Multiple Vitamins-Minerals (MULTIVITAL) tablet Take 1 tablet by mouth daily.        . ramipril (ALTACE) 5 MG capsule Take 1 capsule (5 mg total) by mouth daily.  90 capsule  4  . solifenacin (VESICARE) 10 MG tablet Take 1 tablet (10 mg total) by mouth daily.  90 tablet  3  . Testosterone  (ANDROGEL) 20.25 MG/1.25GM (1.62%) GEL Place onto the skin daily. Two pumps daily.      . vardenafil (LEVITRA) 20 MG tablet Take 20 mg by mouth as needed.      Marland Kitchen DISCONTD: glyBURIDE-metformin (GLUCOVANCE) 2.5-500 MG per tablet 2 tablets In the evening,  3 tablets in the morning  450 tablet  2  . alum & mag hydroxide-simeth (MYLANTA) 200-200-20 MG/5ML suspension Take by mouth as needed.        . glyBURIDE micronized (GLYNASE)  1.5 MG tablet Take 1 tablet (1.5 mg total) by mouth daily with breakfast.  60 tablet  3  . Lancets (ACCU-CHEK SOFT TOUCH) lancets Use as instructed  100 each  12  . metFORMIN (GLUCOPHAGE) 1000 MG tablet Take 1 tablet (1,000 mg total) by mouth 2 (two) times daily with a meal.  60 tablet  1  . DISCONTD: insulin detemir ([name]) 100 UNIT/ML injection Inject 10 Units into the skin at bedtime.  10 mL  12     Orders Placed This Encounter  Procedures  . Lipid panel  . Comprehensive metabolic panel  . Hemoglobin A1c    Return in about 3 months (around 01/14/2012).

## 2011-10-14 NOTE — Patient Instructions (Addendum)
Continue 1000 mg of metformin twice daily but decrease the glyburide to 1 tablet twice daily  (I have sent new rx's to [facility] to separate them out )    I will send you your results via MyChart .  If you do not receive them by [facility] or Tuesday,  Call us

## 2011-10-15 NOTE — Assessment & Plan Note (Signed)
I have addressed  BMI and recommended a low glycemic index diet utilizing smaller more frequent meals to increase metabolism.  I have also recommended that patient start exercising with a goal of 30 minutes of aerobic exercise a minimum of 5 days per week.  

## 2011-10-15 NOTE — Assessment & Plan Note (Signed)
LDL of 75 currently. No changes to her regimen.

## 2011-10-15 NOTE — Assessment & Plan Note (Signed)
He is undecided about surgical treatment of this schwannoma. He has attended a conference in [place] recently N. met with a specialist. He is having nightly tinnitus which is affecting sleep. I have urged him to consider  definitive therapy.

## 2011-10-15 NOTE — Assessment & Plan Note (Signed)
His his diabetes has come under better control with dietary modification.  I have decreased his glyburide dose to 1.5 mg twice daily given his adoption of the Nutrisystem diet. His current hemoglobin A1c is 6.9. He continue 1000 mg twice daily of metformin.

## 2011-11-14 ENCOUNTER — Encounter: Payer: Self-pay | Admitting: Cardiovascular Disease

## 2011-11-14 ENCOUNTER — Ambulatory Visit (INDEPENDENT_AMBULATORY_CARE_PROVIDER_SITE_OTHER): Payer: Federal, State, Local not specified - PPO | Admitting: Cardiovascular Disease

## 2011-11-14 VITALS — BP 138/78 | HR 77 | Ht 67.0 in | Wt 250.2 lb

## 2011-11-14 DIAGNOSIS — I10 Essential (primary) hypertension: Secondary | ICD-10-CM

## 2011-11-14 DIAGNOSIS — R079 Chest pain, unspecified: Secondary | ICD-10-CM

## 2011-11-14 DIAGNOSIS — I25810 Atherosclerosis of coronary artery bypass graft(s) without angina pectoris: Secondary | ICD-10-CM

## 2011-11-14 DIAGNOSIS — E119 Type 2 diabetes mellitus without complications: Secondary | ICD-10-CM

## 2011-11-14 DIAGNOSIS — E785 Hyperlipidemia, unspecified: Secondary | ICD-10-CM

## 2011-11-14 NOTE — Assessment & Plan Note (Signed)
Blood pressure is well controlled on today's visit. No changes made to the medications. 

## 2011-11-14 NOTE — Patient Instructions (Addendum)
You are doing well. No medication changes were made.  Please call us if you have new issues that need to be addressed before your next appt.  Your physician wants you to follow-up in: 6 months.  You will receive a reminder letter in the mail two months in advance. If you don't receive a letter, please call our office to schedule the follow-up appointment.   

## 2011-11-14 NOTE — Progress Notes (Signed)
Patient ID: ZAKYRIE JAKES, male    DOB: 1946-01-02, 66 y.o.   MRN: [id number]  HPI Comments: 66 year old gentleman with a history of coronary artery disease, bypass surgery x5 with a LIMA to the LAD, vein graft to the D1, vein graft to the OM1 and OM 2, vein graft to the RCA with repeat catheterization in June 2009 showing 95% stenosis of the ostial LAD with total occlusion of the proximal LAD, 70% proximal circumflex disease with occlusion of the marginal vessel from the AV groove, diffuse RCA disease with 80% proximal disease followed by 95% mid RCA disease, patent vein grafts. He has obesity, diabetes, hypertension, hyperlipidemia.   He has an acoustic neuroma. He continues to debate between pursuing surgery or radiation.  He reports that he has been doing well.  He does have significant stress at home. He has had illnesses in the family and has been the caretaker for every body. Sister was recently sick. He denies any significant chest discomfort or worsening shortness of breath. No significant edema.  He reports blood pressure has been relatively well controlled He is now taking a full Vytorin 10/20 mg. He does not do a regular exercise program.  Recent total cholesterol 159, hemoglobin A1c 6.9   EKG shows normal sinus rhythm with rate of 77 beats per minute with no significant ST or T wave changes, rare PVC       Outpatient Encounter Prescriptions as of 11/14/2011  Medication Sig Dispense Refill  . alum & mag hydroxide-simeth (MYLANTA) 200-200-20 MG/5ML suspension Take by mouth as needed.        Marland Kitchen aspirin (ASPIR-LOW) 81 MG EC tablet Take 162 mg by mouth daily.       . carvedilol (COREG) 25 MG tablet Take 12.5 mg by mouth 2 (two) times daily with a meal. Take 1-2 tabs      . esomeprazole (NEXIUM) 20 MG packet Take 20 mg by mouth daily.        Marland Kitchen ezetimibe-simvastatin (VYTORIN) 10-20 MG per tablet Take 1 tablet by mouth at bedtime.  90 tablet  4  . furosemide (LASIX) 40 MG tablet Take 1  tablet (40 mg total) by mouth daily. Take 1-2 tabs  90 tablet  4  . glyBURIDE micronized (GLYNASE) 1.5 MG tablet Take 1 tablet (1.5 mg total) by mouth daily with breakfast.  60 tablet  3  . hydrocortisone (ANUCORT-HC) 25 MG suppository Place 25 mg rectally as needed.      . Lancets (ACCU-CHEK SOFT TOUCH) lancets Use as instructed  100 each  12  . metFORMIN (GLUCOPHAGE) 1000 MG tablet Take 1 tablet (1,000 mg total) by mouth 2 (two) times daily with a meal.  60 tablet  1  . Multiple Vitamins-Minerals (MULTIVITAL) tablet Take 1 tablet by mouth daily.        . ramipril (ALTACE) 5 MG capsule Take 1 capsule (5 mg total) by mouth daily.  90 capsule  4  . solifenacin (VESICARE) 10 MG tablet Take 1 tablet (10 mg total) by mouth daily.  90 tablet  3  . Testosterone (ANDROGEL) 20.25 MG/1.25GM (1.62%) GEL Place onto the skin daily. Two pumps daily.      . vardenafil (LEVITRA) 20 MG tablet Take 20 mg by mouth as needed.        Review of Systems  Constitutional: Negative.   HENT: Negative.        Hearing loss  Eyes: Negative.   Respiratory: Negative.   Cardiovascular: Negative.  Gastrointestinal: Negative.   Musculoskeletal: Negative.   Skin: Negative.   Hematological: Negative.   Psychiatric/Behavioral: Negative.   All other systems reviewed and are negative.    BP 138/78  Pulse 77  Ht 5\' 7"  (1.702 m)  Wt 250 lb 4 oz (113.513 kg)  BMI 39.19 kg/m2  Physical Exam  Nursing note and vitals reviewed. Constitutional: He is oriented to person, place, and time. He appears well-developed and well-nourished.       obese  HENT:  Head: Normocephalic.  Nose: Nose normal.  Mouth/Throat: Oropharynx is clear and moist.  Eyes: Conjunctivae normal are normal. Pupils are equal, round, and reactive to light.  Neck: Normal range of motion. Neck supple. No JVD present.  Cardiovascular: Normal rate, regular rhythm, S1 normal, S2 normal, normal heart sounds and intact distal pulses.  Exam reveals no gallop  and no friction rub.   No murmur heard. Pulmonary/Chest: Effort normal and breath sounds normal. No respiratory distress. He has no wheezes. He has no rales. He exhibits no tenderness.  Abdominal: Soft. Bowel sounds are normal. He exhibits no distension. There is no tenderness.  Musculoskeletal: Normal range of motion. He exhibits no edema and no tenderness.  Lymphadenopathy:    He has no cervical adenopathy.  Neurological: He is alert and oriented to person, place, and time. Coordination normal.  Skin: Skin is warm and dry. No rash noted. No erythema.  Psychiatric: He has a normal mood and affect. His behavior is normal. Judgment and thought content normal.           Assessment and Plan

## 2011-11-14 NOTE — Assessment & Plan Note (Signed)
Currently with no symptoms of angina. No further workup at this time. Continue current medication regimen. 

## 2011-11-14 NOTE — Assessment & Plan Note (Signed)
Cholesterol is close to goal. We have encouraged him to continue weight loss and diet.

## 2011-11-14 NOTE — Assessment & Plan Note (Signed)
We have encouraged continued exercise, careful diet management in an effort to lose weight. 

## 2011-11-26 ENCOUNTER — Ambulatory Visit: Payer: Self-pay | Admitting: Internal Medicine

## 2011-12-14 ENCOUNTER — Other Ambulatory Visit: Payer: Self-pay | Admitting: Internal Medicine

## 2011-12-14 MED ORDER — ESOMEPRAZOLE MAGNESIUM 20 MG PO PACK: 20.0000 mg | PACK | Freq: Every day | ORAL | Status: DC

## 2011-12-14 NOTE — Telephone Encounter (Signed)
Nexium  20 mg # 90 3

## 2011-12-14 NOTE — Telephone Encounter (Signed)
Pt is needing refill on Nexium he uses [facility]. He would like 90 day supply.

## 2011-12-14 NOTE — Telephone Encounter (Signed)
Nexium 20 mg #90 3 R sent [facility]

## 2012-01-02 ENCOUNTER — Telehealth: Payer: Self-pay | Admitting: Internal Medicine

## 2012-01-02 NOTE — Telephone Encounter (Signed)
Patient is inquiring about his Nexium . He has not received his medication from the mail order service . He wants know if you have given them the prior authorization to fill this medication.

## 2012-01-02 NOTE — Telephone Encounter (Signed)
PA had been initiated, waiting on Insurance company to verify PA has been approved.

## 2012-01-03 NOTE — Telephone Encounter (Signed)
Patient left voicemail on 01/02/12 stating his nexium has came in.

## 2012-01-17 ENCOUNTER — Ambulatory Visit (INDEPENDENT_AMBULATORY_CARE_PROVIDER_SITE_OTHER): Payer: Federal, State, Local not specified - PPO | Admitting: Internal Medicine

## 2012-01-17 ENCOUNTER — Encounter: Payer: Self-pay | Admitting: Internal Medicine

## 2012-01-17 VITALS — BP 122/68 | HR 80 | Temp 98.0°F | Resp 16 | Wt 245.5 lb

## 2012-01-17 DIAGNOSIS — E119 Type 2 diabetes mellitus without complications: Secondary | ICD-10-CM

## 2012-01-17 DIAGNOSIS — D333 Benign neoplasm of cranial nerves: Secondary | ICD-10-CM

## 2012-01-17 DIAGNOSIS — E669 Obesity, unspecified: Secondary | ICD-10-CM

## 2012-01-17 DIAGNOSIS — IMO0001 Reserved for inherently not codable concepts without codable children: Secondary | ICD-10-CM

## 2012-01-17 DIAGNOSIS — E785 Hyperlipidemia, unspecified: Secondary | ICD-10-CM

## 2012-01-17 DIAGNOSIS — E1165 Type 2 diabetes mellitus with hyperglycemia: Secondary | ICD-10-CM

## 2012-01-17 DIAGNOSIS — IMO0002 Reserved for concepts with insufficient information to code with codable children: Secondary | ICD-10-CM

## 2012-01-17 DIAGNOSIS — I10 Essential (primary) hypertension: Secondary | ICD-10-CM

## 2012-01-17 LAB — LIPID PANEL
Cholesterol: 217 mg/dL — ABNORMAL HIGH (ref 0–200)
HDL: 44.1 mg/dL (ref >39.00)
Triglycerides: 346 mg/dL — ABNORMAL HIGH (ref 0.0–149.0)

## 2012-01-17 LAB — COMPREHENSIVE METABOLIC PANEL
ALT: 21 U/L (ref 0–53)
BUN: 21 mg/dL (ref 6–23)
CO2: 28 mEq/L (ref 19–32)
Calcium: 9.8 mg/dL (ref 8.4–10.5)
Chloride: 101 mEq/L (ref 96–112)
Creatinine, Ser: 1.5 mg/dL (ref 0.4–1.5)
GFR: 50.34 mL/min — ABNORMAL LOW (ref >60.00)
Glucose, Bld: 141 mg/dL — ABNORMAL HIGH (ref 70–99)
Total Bilirubin: 0.6 mg/dL (ref 0.3–1.2)

## 2012-01-17 LAB — MICROALBUMIN / CREATININE URINE RATIO
Creatinine,U: 179.2 mg/dL
Microalb, Ur: 1.1 mg/dL (ref 0.0–1.9)

## 2012-01-17 LAB — LDL CHOLESTEROL, DIRECT: Direct LDL: 114.4 mg/dL

## 2012-01-17 LAB — HEMOGLOBIN A1C: Hgb A1c MFr Bld: 6.9 % — ABNORMAL HIGH (ref 4.6–6.5)

## 2012-01-17 NOTE — Assessment & Plan Note (Signed)
Well controlled on current regimen. Renal function stable, no changes today. 

## 2012-01-17 NOTE — Assessment & Plan Note (Addendum)
triglcyerides are over 300.  Has been taking Vytorin.  Will discus changing to crestor and trilipix

## 2012-01-17 NOTE — Assessment & Plan Note (Signed)
His BMI remains unchanged despite use of Nutrisystem diet. exercise. He is not exercising and eats only 1 meal daily He remains resistant to sound advice , but I have advised him to get back on the low GI diet using six smaller meals a day to stimulate her metabolism.

## 2012-01-17 NOTE — Assessment & Plan Note (Addendum)
He has been adjusting his own medications over the past months by resuming Actos which I have not advised.  A1c is 6.9 .  He is on appropriate antihypertensices, is  overdue for diabetic eye exam.

## 2012-01-17 NOTE — Progress Notes (Signed)
Patient ID: [name], male   DOB: December 11, 1945, 67 y.o.   MRN: [id number]    Patient Active Problem List  Diagnosis  . Diabetes mellitus type 2, controlled  . HYPERLIPIDEMIA-MIXED  . HYPERTENSION, BENIGN  . CAD  . CAD, ARTERY BYPASS GRAFT  . Acoustic neuroma  . History of pancreatitis  . Diabetes  . CAD (coronary artery disease)  . Obesity    Subjective:  CC:   Chief Complaint  Patient presents with  . Follow-up    HPI:   [name] a 67 y.o. male who presents  Diabetes follow up.  No lows since last visit.  Has been using Actos again to lower bs.  Has  Been using glimepiride/metformin based on diet.,  Not the same amount every amoutn.,  Following the nutrisystem diet. Not satisfied with food choices. fastingsl 115 to 140  No sugars over 200 Recent fall,  Happens every 3 months occurred while walking in from loading the car. Wasn't carrying anything.  Last time missed the bottom step, this time may have been the curb.  Face hit the cement. broke glasses.  Cut over right  eye and went to urgent care  after bleeding wouldn't stop the 3 days .  Last diabetic eye exam was 2 years ago.  [facility].    Past Medical History  Diagnosis Date  . HLD (hyperlipidemia)   . CAD (coronary artery disease)     multivessel diffuse disease  . HTN (hypertension)   . Hearing loss in right ear   . Hoarseness   . AN (acoustic neuroma) 2012    by MRi, Juengel  . History of pancreatitis 1996    idiopathic  . Diabetes mellitus     secondary to pancreatitis    Past Surgical History  Procedure Date  . Coronary artery bypass graft NOV 2007    x5  . Carpal tunnel release   . Back surgery   . Cyst removal of right hand   . Ruptured disk surgery     neck   . Coronary angioplasty with stent placement   . Basal cell carcinoma excision 2013    removed from right side of face         The following portions of the patient's history were reviewed and updated as  appropriate: Allergies, current medications, and problem list.    Review of Systems:   12 Pt  review of systems was negative except those addressed in the HPI,     History   Social History  . Marital Status: Married    Spouse Name: N/A    Number of Children: N/A  . Years of Education: N/A   Occupational History  . Not on file.   Social History Main Topics  . Smoking status: Never Smoker   . Smokeless tobacco: Never Used     Comment: tobacco use- no   . Alcohol Use: No  . Drug Use: No  . Sexually Active: Not on file   Other Topics Concern  . Not on file   Social History Narrative   Does not regularly exercise. Works part time at [employer] 2-3 hours at night.     Objective:  BP 122/68  Pulse 80  Temp 98 F (36.7 C) (Oral)  Resp 16  Wt 245 lb 8 oz (111.358 kg)  SpO2 94%  General appearance: alert, cooperative and appears stated age Ears: normal TM's and external ear canals both ears Throat: lips, mucosa, and  tongue normal; teeth and gums normal Neck: no adenopathy, no carotid bruit, supple, symmetrical, trachea midline and thyroid not enlarged, symmetric, no tenderness/mass/nodules Back: symmetric, no curvature. ROM normal. No CVA tenderness. Lungs: clear to auscultation bilaterally Heart: regular rate and rhythm, S1, S2 normal, no murmur, click, rub or gallop Abdomen: soft, non-tender; bowel sounds normal; no masses,  no organomegaly Pulses: 2+ and symmetric Skin: Skin color, texture, turgor normal. No rashes or lesions Lymph nodes: Cervical, supraclavicular, and axillary nodes normal.  Assessment and Plan:  Acoustic neuroma He went to a conference in [place] and discussed risk and benefits of gamma knife vs other treatments. With specilaists,  Has decided not to do it bc of the risk of hearing loss and loss of balance.   Diabetes mellitus type 2, controlled He has been adjusting his own medications over the past months by resuming Actos which I have not  advised.  A1c is 6.9 .  He is on appropriate antihypertensices, is  overdue for diabetic eye exam.   HYPERLIPIDEMIA-MIXED triglcyerides are over 300.  Has been taking Vytorin.  Will discus changing to crestor and trilipix  Obesity His BMI remains unchanged despite use of Nutrisystem diet. exercise. He is not exercising and eats only 1 meal daily He remains resistant to sound advice , but I have advised him to get back on the low GI diet using six smaller meals a day to stimulate her metabolism.    HYPERTENSION, BENIGN Well controlled on current regimen. Renal function stable, no changes today.   Updated Medication List Outpatient Encounter Prescriptions as of 01/17/2012  Medication Sig Dispense Refill  . alum & mag hydroxide-simeth (MYLANTA) 200-200-20 MG/5ML suspension Take by mouth as needed.        Marland Kitchen aspirin (ASPIR-LOW) 81 MG EC tablet Take 162 mg by mouth daily.       . carvedilol (COREG) 25 MG tablet Take 12.5 mg by mouth 2 (two) times daily with a meal. Take 1-2 tabs      . esomeprazole (NEXIUM) 20 MG packet Take 20 mg by mouth daily.  90 each  3  . furosemide (LASIX) 40 MG tablet Take 1 tablet (40 mg total) by mouth daily. Take 1-2 tabs  90 tablet  4  . glyBURIDE micronized (GLYNASE) 1.5 MG tablet Take 1 tablet (1.5 mg total) by mouth daily with breakfast.  60 tablet  3  . hydrocortisone (ANUCORT-HC) 25 MG suppository Place 25 mg rectally as needed.      . Lancets (ACCU-CHEK SOFT TOUCH) lancets Use as instructed  100 each  12  . metFORMIN (GLUCOPHAGE) 1000 MG tablet Take 1 tablet (1,000 mg total) by mouth 2 (two) times daily with a meal.  60 tablet  1  . Multiple Vitamins-Minerals (MULTIVITAL) tablet Take 1 tablet by mouth daily.        . ramipril (ALTACE) 5 MG capsule Take 1 capsule (5 mg total) by mouth daily.  90 capsule  4  . solifenacin (VESICARE) 10 MG tablet Take 1 tablet (10 mg total) by mouth daily.  90 tablet  3  . Testosterone (ANDROGEL) 20.25 MG/1.25GM (1.62%) GEL Place  onto the skin daily. Two pumps daily.      . vardenafil (LEVITRA) 20 MG tablet Take 20 mg by mouth as needed.      . [DISCONTINUED] pioglitazone (ACTOS) 45 MG tablet Take 45 mg by mouth daily.      Marland Kitchen ezetimibe-simvastatin (VYTORIN) 10-20 MG per tablet Take 1 tablet by mouth at  bedtime.  90 tablet  4  . [DISCONTINUED] esomeprazole (NEXIUM) 20 MG packet Take 20 mg by mouth daily.  30 each  3     Orders Placed This Encounter  Procedures  . Microalbumin / creatinine urine ratio  . Comprehensive metabolic panel  . Hemoglobin A1c  . Lipid panel  . LDL cholesterol, direct    No Follow-up on file.

## 2012-01-17 NOTE — Assessment & Plan Note (Signed)
He went to a conference in [place] and discussed risk and benefits of gamma knife vs other treatments. With specilaists,  Has decided not to do it bc of the risk of hearing loss and loss of balance.

## 2012-01-17 NOTE — Patient Instructions (Signed)
This is  my version of a  "Low GI"  Diet:  All of the foods can be found at grocery stores and in bulk at [employer].  The Atkins protein bars and shakes are available in more varieties at [employer], [employer] and [employer].     7 AM Breakfast:  Low carbohydrate Protein  Shakes (I recommend the EAS AdvantEdge "Carb Control" shakes  Or the low carb shakes by Atkins.   Both are available everywhere:  In  cases at [employer]Js  Or in 4 packs at grocery stores and pharmacies  2.5 carbs  (Alternative is  a toasted Arnold's Sandwhich Thin w/ peanut butter, a "Bagel Thin" with cream cheese and salmon) or  a scrambled egg burrito made with a low carb tortilla .  Avoid cereal and bananas, oatmeal too unless you are cooking the old fashioned kind that takes 30-40 minutes to prepare.  the rest is overly processed, has minimal fiber, and is loaded with carbohydrates!   10 AM: Protein bar by Atkins (the snack size, under 200 cal).  There are many varieties , available widely again or in bulk in limited varieties at BJs)  Other so called "protein bars" tend to be loaded with carbohydrates.  Remember, in food advertising, the word "energy" is synonymous for " carbohydrate."  Lunch: sandwich of [place], (or any lunchmeat, grilled meat or canned tuna), fresh avocado, mayonnaise  and cheese on a lower carbohydrate pita bread, flatbread, or tortilla . Ok to use regular mayonnaise. The bread is the only source or carbohydrate that can be decreased (Joseph's makes a pita bread and a flat bread that are 50 cal and 4 net carbs ; Toufayan makes a low carb flatbread that's 100 cal and 9 net carbs  and  Mission makes a low carb whole wheat tortilla  That is 210 cal and 6 net carbs)  3 PM:  Mid day :  Another protein bar,  Or a  cheese stick (100 cal, 0 carbs),  Or 1 ounce of  almonds, walnuts, pistachios, pecans, peanuts,  Macadamia nuts. Or a Dannon light n Fit greek yogurt, 80 cal 8 net carbs . Avoid "granola"; the dried cranberries and  raisins are loaded with carbohydrates. Mixed nuts ok if no raisins or cranberries or dried fruit.      6 PM  Dinner:  "mean and green:"  Meat/chicken/fish or a high protein legume; , with a green salad, and a low GI  Veggie (broccoli, cauliflower, green beans, spinach, brussel sprouts. Lima beans) : Avoid "Low fat dressings, as well as Reyne Dumas and [address]! They are loaded with sugar! Instead use ranch, vinagrette,  Blue cheese, etc.  There is a low carb pasta by Dreamfield's available at [facility] that is acceptable and tastes great. Try Michel Angel's chicken piccata over low carb pasta. The chicken dish is 0 carbs, and can be found in frozen section at BJs and [employer]. Also try [employer] "Carnitas" (pulled pork, no sauce,  0 carbs) and his pot roast.   both are in the refrigerated section at BJs   9 PM snack : Breyer's "low carb" fudgsicle or  ice cream bar (Carb Smart line), or  Weight Watcher's ice cream bar , or another "no sugar added" ice cream;a serving of fresh berries/cherries with whipped cream (Avoid bananas, pineapple, grapes  and watermelon on a regular basis because they are high in sugar)   Remember that snack Substitutions should be less than 15 to  20 carbs  Per serving. Remember to subtract fiber grams and sugar alcohols to get the "net carbs."  Dreamfield's has a low carb pasta that tastes like pasta .  [employer] and [employer] all carry it int he pasta section

## 2012-02-07 ENCOUNTER — Encounter: Payer: Self-pay | Admitting: Internal Medicine

## 2012-02-22 ENCOUNTER — Ambulatory Visit: Payer: Self-pay | Admitting: Unknown Physician Specialty

## 2012-04-19 ENCOUNTER — Ambulatory Visit: Payer: Federal, State, Local not specified - PPO | Admitting: Internal Medicine

## 2012-05-16 ENCOUNTER — Ambulatory Visit: Payer: Federal, State, Local not specified - PPO | Admitting: Cardiovascular Disease

## 2012-05-21 ENCOUNTER — Encounter: Payer: Self-pay | Admitting: Cardiovascular Disease

## 2012-05-21 ENCOUNTER — Ambulatory Visit (INDEPENDENT_AMBULATORY_CARE_PROVIDER_SITE_OTHER): Payer: Federal, State, Local not specified - PPO | Admitting: Cardiovascular Disease

## 2012-05-21 VITALS — BP 128/80 | HR 81 | Ht 66.0 in | Wt 236.0 lb

## 2012-05-21 DIAGNOSIS — I10 Essential (primary) hypertension: Secondary | ICD-10-CM

## 2012-05-21 DIAGNOSIS — E119 Type 2 diabetes mellitus without complications: Secondary | ICD-10-CM

## 2012-05-21 DIAGNOSIS — E785 Hyperlipidemia, unspecified: Secondary | ICD-10-CM

## 2012-05-21 DIAGNOSIS — I25810 Atherosclerosis of coronary artery bypass graft(s) without angina pectoris: Secondary | ICD-10-CM

## 2012-05-21 DIAGNOSIS — Z951 Presence of aortocoronary bypass graft: Secondary | ICD-10-CM

## 2012-05-21 DIAGNOSIS — E669 Obesity, unspecified: Secondary | ICD-10-CM

## 2012-05-21 MED ORDER — EZETIMIBE-SIMVASTATIN 10-20 MG PO TABS: 1.0000 | ORAL_TABLET | Freq: Every day | ORAL | Status: DC

## 2012-05-21 MED ORDER — EZETIMIBE-SIMVASTATIN 10-40 MG PO TABS: 1.0000 | ORAL_TABLET | Freq: Every day | ORAL | Status: DC

## 2012-05-21 MED ORDER — MECLIZINE HCL 25 MG PO TABS: 25.0000 mg | ORAL_TABLET | Freq: Three times a day (TID) | ORAL | Status: DC | PRN

## 2012-05-21 MED ORDER — FUROSEMIDE 20 MG PO TABS: 20.0000 mg | ORAL_TABLET | Freq: Two times a day (BID) | ORAL | Status: DC | PRN

## 2012-05-21 NOTE — Assessment & Plan Note (Signed)
Blood pressure is well controlled on today's visit. No changes made to the medications. 

## 2012-05-21 NOTE — Assessment & Plan Note (Signed)
We have encouraged continued exercise, careful diet management in an effort to lose weight. 

## 2012-05-21 NOTE — Assessment & Plan Note (Signed)
Cholesterol poorly controlled. We will increase his Vytorin to 10/40 mg daily. Needs better diabetes control

## 2012-05-21 NOTE — Progress Notes (Signed)
Patient ID: [name], male    DOB: [birth year], 67 y.o.   MRN: [id number]  HPI Comments: 67 year old gentleman with a history of coronary artery disease, bypass surgery x5 with a LIMA to the LAD, vein graft to the D1, vein graft to the OM1 and OM 2, vein graft to the RCA with repeat catheterization in June 2009 showing 95% stenosis of the ostial LAD with total occlusion of the proximal LAD, 70% proximal circumflex disease with occlusion of the marginal vessel from the AV groove, diffuse RCA disease with 80% proximal disease followed by 95% mid RCA disease, patent vein grafts. He has obesity, diabetes, hypertension, hyperlipidemia.   He has an acoustic neuroma. He continues to debate between pursuing surgery or radiation.  He reports that he has been doing well. He has had one episode of severe vertigo. He has been trying to lose weight. Glucose levels over the past several months have been running very high. Given prednisone for his knee several months ago. Since then hemoglobin A1c has climbed, now greater than 8 by his report. He is off his diabetes medications. He was trying to manage his diabetes with aggressive diet. He reports that diet is not working and he had significant weight gain and went back to his own diet.  Feels better on testosterone supplementation  He reports blood pressure has been relatively well controlled He is now taking a full Vytorin 10/20 mg. He does not do a regular exercise program.  Previous total cholesterol 159, hemoglobin A1c 6.9 Now total cholesterol greater than 200, hemoglobin A1c greater than 8   EKG shows normal sinus rhythm with rate of 81 beats per minute with no significant ST or T wave changes       Outpatient Encounter Prescriptions as of 05/21/2012  Medication Sig Dispense Refill  . aspirin (ASPIR-LOW) 81 MG EC tablet Take 162 mg by mouth daily.       . carvedilol (COREG) 25 MG tablet Take 12.5 mg by mouth 2 (two) times daily with a meal.        . esomeprazole (NEXIUM) 20 MG packet Take 20 mg by mouth daily.  90 each  3  . ezetimibe-simvastatin (VYTORIN) 10-20 MG per tablet Take 1 tablet by mouth at bedtime.  90 tablet  4  . furosemide (LASIX) 40 MG tablet 1/2 tab po bid      . glyBURIDE-metformin (GLUCOVANCE) 2.5-500 MG per tablet 3 tab am and 2 tabs qhs      . hydrocortisone (ANUCORT-HC) 25 MG suppository Place 25 mg rectally as needed.      . Lancets (ACCU-CHEK SOFT TOUCH) lancets Use as instructed  100 each  12  . Multiple Vitamins-Minerals (MULTIVITAL) tablet Take 1 tablet by mouth daily.        . ramipril (ALTACE) 5 MG capsule Take 1 capsule (5 mg total) by mouth daily.  90 capsule  4  . solifenacin (VESICARE) 10 MG tablet Take 1 tablet (10 mg total) by mouth daily.  90 tablet  3  . Testosterone (ANDROGEL) 20.25 MG/1.25GM (1.62%) GEL Place onto the skin daily. Two pumps daily.      . vardenafil (LEVITRA) 20 MG tablet Take 20 mg by mouth as needed.      . [DISCONTINUED] furosemide (LASIX) 40 MG tablet Take 1 tablet (40 mg total) by mouth daily. Take 1-2 tabs  90 tablet  4  . [DISCONTINUED] alum & mag hydroxide-simeth (MYLANTA) 200-200-20 MG/5ML suspension Take by mouth as needed.        . [  DISCONTINUED] glyBURIDE micronized (GLYNASE) 1.5 MG tablet Take 1 tablet (1.5 mg total) by mouth daily with breakfast.  60 tablet  3  . [DISCONTINUED] metFORMIN (GLUCOPHAGE) 1000 MG tablet Take 1 tablet (1,000 mg total) by mouth 2 (two) times daily with a meal.  60 tablet  1   No facility-administered encounter medications on file as of 05/21/2012.    Review of Systems  Constitutional: Negative.   HENT: Negative.        Hearing loss  Eyes: Negative.   Respiratory: Negative.   Cardiovascular: Negative.   Gastrointestinal: Negative.   Musculoskeletal: Negative.   Skin: Negative.   Psychiatric/Behavioral: Negative.   All other systems reviewed and are negative.    BP 128/80  Pulse 81  Ht 5\' 6"  (1.676 m)  Wt 236 lb (107.049 kg)  BMI  38.11 kg/m2  Physical Exam  Nursing note and vitals reviewed. Constitutional: He is oriented to person, place, and time. He appears well-developed and well-nourished.  obese  HENT:  Head: Normocephalic.  Nose: Nose normal.  Mouth/Throat: Oropharynx is clear and moist.  Eyes: Conjunctivae are normal. Pupils are equal, round, and reactive to light.  Neck: Normal range of motion. Neck supple. No JVD present.  Cardiovascular: Normal rate, regular rhythm, S1 normal, S2 normal, normal heart sounds and intact distal pulses.  Exam reveals no gallop and no friction rub.   No murmur heard. Pulmonary/Chest: Effort normal and breath sounds normal. No respiratory distress. He has no wheezes. He has no rales. He exhibits no tenderness.  Abdominal: Soft. Bowel sounds are normal. He exhibits no distension. There is no tenderness.  Musculoskeletal: Normal range of motion. He exhibits no edema and no tenderness.  Lymphadenopathy:    He has no cervical adenopathy.  Neurological: He is alert and oriented to person, place, and time. Coordination normal.  Skin: Skin is warm and dry. No rash noted. No erythema.  Psychiatric: He has a normal mood and affect. His behavior is normal. Judgment and thought content normal.      Assessment and Plan

## 2012-05-21 NOTE — Assessment & Plan Note (Addendum)
We have encouraged continued exercise, careful diet management in an effort to lose weight. He we'll talk about this with his primary care physician. Will likely need  to get back on insulin.

## 2012-05-21 NOTE — Assessment & Plan Note (Signed)
Currently with no symptoms of angina. No further workup at this time. Continue current medication regimen. 

## 2012-05-21 NOTE — Patient Instructions (Addendum)
You are doing well. Please change vytorin to 10/40 mg daily   Please call us if you have new issues that need to be addressed before your next appt.  Your physician wants you to follow-up in: 6 months.  You will receive a reminder letter in the mail two months in advance. If you don't receive a letter, please call our office to schedule the follow-up appointment.

## 2012-08-08 ENCOUNTER — Other Ambulatory Visit: Payer: Self-pay

## 2012-09-10 ENCOUNTER — Other Ambulatory Visit: Payer: Self-pay | Admitting: Cardiovascular Disease

## 2012-11-08 ENCOUNTER — Other Ambulatory Visit: Payer: Self-pay

## 2013-03-29 ENCOUNTER — Inpatient Hospital Stay: Payer: Self-pay | Admitting: Internal Medicine

## 2013-03-29 LAB — HEMOGLOBIN A1C: Hemoglobin A1C: 7.7 % — ABNORMAL HIGH (ref 4.2–6.3)

## 2013-03-29 LAB — URINALYSIS, COMPLETE
Bacteria: NONE SEEN
Bilirubin,UR: NEGATIVE
LEUKOCYTE ESTERASE: NEGATIVE
Nitrite: NEGATIVE
Ph: 5 (ref 4.5–8.0)
Protein: NEGATIVE
SQUAMOUS EPITHELIAL: NONE SEEN
Specific Gravity: 1.011 (ref 1.003–1.030)

## 2013-03-29 LAB — COMPREHENSIVE METABOLIC PANEL
ALK PHOS: 80 U/L
Albumin: 4.1 g/dL (ref 3.4–5.0)
Anion Gap: 9 (ref 7–16)
BUN: 20 mg/dL — ABNORMAL HIGH (ref 7–18)
Bilirubin,Total: 0.7 mg/dL (ref 0.2–1.0)
CHLORIDE: 103 mmol/L (ref 98–107)
CO2: 24 mmol/L (ref 21–32)
Calcium, Total: 9.7 mg/dL (ref 8.5–10.1)
Creatinine: 1.36 mg/dL — ABNORMAL HIGH (ref 0.60–1.30)
GFR CALC NON AF AMER: 53 — AB
Glucose: 263 mg/dL — ABNORMAL HIGH (ref 65–99)
Osmolality: 284 (ref 275–301)
Potassium: 4.1 mmol/L (ref 3.5–5.1)
SGOT(AST): 15 U/L (ref 15–37)
SGPT (ALT): 26 U/L (ref 12–78)
SODIUM: 136 mmol/L (ref 136–145)
Total Protein: 7.9 g/dL (ref 6.4–8.2)

## 2013-03-29 LAB — CBC WITH DIFFERENTIAL/PLATELET
BASOS ABS: 0.1 10*3/uL (ref 0.0–0.1)
Basophil %: 0.4 %
EOS ABS: 0 10*3/uL (ref 0.0–0.7)
Eosinophil %: 0.1 %
HCT: 39.2 % — ABNORMAL LOW (ref 40.0–52.0)
HGB: 13.2 g/dL (ref 13.0–18.0)
LYMPHS PCT: 6.9 %
Lymphocyte #: 1 10*3/uL (ref 1.0–3.6)
MCH: 31.1 pg (ref 26.0–34.0)
MCHC: 33.6 g/dL (ref 32.0–36.0)
MCV: 93 fL (ref 80–100)
Monocyte #: 1.2 x10 3/mm — ABNORMAL HIGH (ref 0.2–1.0)
Monocyte %: 8.2 %
NEUTROS PCT: 84.4 %
Neutrophil #: 12.7 10*3/uL — ABNORMAL HIGH (ref 1.4–6.5)
PLATELETS: 214 10*3/uL (ref 150–440)
RBC: 4.23 10*6/uL — ABNORMAL LOW (ref 4.40–5.90)
RDW: 13.3 % (ref 11.5–14.5)
WBC: 15 10*3/uL — ABNORMAL HIGH (ref 3.8–10.6)

## 2013-03-29 LAB — TROPONIN I
Troponin-I: <0.02 ng/mL
Troponin-I: <0.02 ng/mL

## 2013-03-29 LAB — TSH: Thyroid Stimulating Horm: 0.496 u[IU]/mL

## 2013-03-30 LAB — COMPREHENSIVE METABOLIC PANEL
ALBUMIN: 3.7 g/dL (ref 3.4–5.0)
AST: 13 U/L — AB (ref 15–37)
Alkaline Phosphatase: 74 U/L
Anion Gap: 6 — ABNORMAL LOW (ref 7–16)
BILIRUBIN TOTAL: 0.6 mg/dL (ref 0.2–1.0)
BUN: 22 mg/dL — ABNORMAL HIGH (ref 7–18)
CREATININE: 1.52 mg/dL — AB (ref 0.60–1.30)
Calcium, Total: 9.3 mg/dL (ref 8.5–10.1)
Chloride: 100 mmol/L (ref 98–107)
Co2: 29 mmol/L (ref 21–32)
EGFR (African American): 54 — ABNORMAL LOW
EGFR (Non-African Amer.): 46 — ABNORMAL LOW
GLUCOSE: 199 mg/dL — AB (ref 65–99)
Osmolality: 279 (ref 275–301)
POTASSIUM: 4.1 mmol/L (ref 3.5–5.1)
SGPT (ALT): 25 U/L (ref 12–78)
Sodium: 135 mmol/L — ABNORMAL LOW (ref 136–145)
Total Protein: 8.1 g/dL (ref 6.4–8.2)

## 2013-03-30 LAB — CBC WITH DIFFERENTIAL/PLATELET
BASOS PCT: 0.4 %
Basophil #: 0 10*3/uL (ref 0.0–0.1)
EOS ABS: 0 10*3/uL (ref 0.0–0.7)
Eosinophil %: 0 %
HCT: 38.9 % — ABNORMAL LOW (ref 40.0–52.0)
HGB: 13.4 g/dL (ref 13.0–18.0)
LYMPHS PCT: 10.2 %
Lymphocyte #: 1.3 10*3/uL (ref 1.0–3.6)
MCH: 32 pg (ref 26.0–34.0)
MCHC: 34.5 g/dL (ref 32.0–36.0)
MCV: 93 fL (ref 80–100)
Monocyte #: 1.3 x10 3/mm — ABNORMAL HIGH (ref 0.2–1.0)
Monocyte %: 10.9 %
Neutrophil #: 9.7 10*3/uL — ABNORMAL HIGH (ref 1.4–6.5)
Neutrophil %: 78.5 %
PLATELETS: 189 10*3/uL (ref 150–440)
RBC: 4.19 10*6/uL — AB (ref 4.40–5.90)
RDW: 13.3 % (ref 11.5–14.5)
WBC: 12.4 10*3/uL — ABNORMAL HIGH (ref 3.8–10.6)

## 2013-03-30 LAB — TROPONIN I: Troponin-I: <0.02 ng/mL

## 2013-03-31 LAB — BASIC METABOLIC PANEL
Anion Gap: 6 — ABNORMAL LOW (ref 7–16)
BUN: 30 mg/dL — AB (ref 7–18)
CALCIUM: 9.1 mg/dL (ref 8.5–10.1)
CHLORIDE: 100 mmol/L (ref 98–107)
Co2: 29 mmol/L (ref 21–32)
Creatinine: 1.56 mg/dL — ABNORMAL HIGH (ref 0.60–1.30)
EGFR (African American): 52 — ABNORMAL LOW
GFR CALC NON AF AMER: 45 — AB
Glucose: 192 mg/dL — ABNORMAL HIGH (ref 65–99)
OSMOLALITY: 281 (ref 275–301)
Potassium: 3.8 mmol/L (ref 3.5–5.1)
Sodium: 135 mmol/L — ABNORMAL LOW (ref 136–145)

## 2013-03-31 LAB — CBC WITH DIFFERENTIAL/PLATELET
BASOS ABS: 0 10*3/uL (ref 0.0–0.1)
BASOS PCT: 0.4 %
EOS PCT: 2.7 %
Eosinophil #: 0.2 10*3/uL (ref 0.0–0.7)
HCT: 38.6 % — ABNORMAL LOW (ref 40.0–52.0)
HGB: 13.1 g/dL (ref 13.0–18.0)
Lymphocyte #: 2.3 10*3/uL (ref 1.0–3.6)
Lymphocyte %: 26.8 %
MCH: 31.7 pg (ref 26.0–34.0)
MCHC: 34 g/dL (ref 32.0–36.0)
MCV: 93 fL (ref 80–100)
MONO ABS: 0.8 x10 3/mm (ref 0.2–1.0)
MONOS PCT: 9.6 %
NEUTROS PCT: 60.5 %
Neutrophil #: 5.2 10*3/uL (ref 1.4–6.5)
Platelet: 216 10*3/uL (ref 150–440)
RBC: 4.13 10*6/uL — ABNORMAL LOW (ref 4.40–5.90)
RDW: 13.4 % (ref 11.5–14.5)
WBC: 8.6 10*3/uL (ref 3.8–10.6)

## 2013-03-31 LAB — URINE CULTURE

## 2013-04-01 LAB — EXPECTORATED SPUTUM ASSESSMENT W REFEX TO RESP CULTURE

## 2013-04-01 LAB — CBC WITH DIFFERENTIAL/PLATELET
BASOS PCT: 0.4 %
Basophil #: 0 10*3/uL (ref 0.0–0.1)
Eosinophil #: 0.3 10*3/uL (ref 0.0–0.7)
Eosinophil %: 4.3 %
HCT: 33 % — ABNORMAL LOW (ref 40.0–52.0)
HGB: 11.5 g/dL — ABNORMAL LOW (ref 13.0–18.0)
LYMPHS PCT: 36.3 %
Lymphocyte #: 2.1 10*3/uL (ref 1.0–3.6)
MCH: 32 pg (ref 26.0–34.0)
MCHC: 34.9 g/dL (ref 32.0–36.0)
MCV: 92 fL (ref 80–100)
MONOS PCT: 9.8 %
Monocyte #: 0.6 x10 3/mm (ref 0.2–1.0)
Neutrophil #: 2.9 10*3/uL (ref 1.4–6.5)
Neutrophil %: 49.2 %
PLATELETS: 179 10*3/uL (ref 150–440)
RBC: 3.6 10*6/uL — ABNORMAL LOW (ref 4.40–5.90)
RDW: 12.9 % (ref 11.5–14.5)
WBC: 5.9 10*3/uL (ref 3.8–10.6)

## 2013-04-01 LAB — BASIC METABOLIC PANEL
ANION GAP: 6 — AB (ref 7–16)
BUN: 30 mg/dL — AB (ref 7–18)
CO2: 26 mmol/L (ref 21–32)
Calcium, Total: 8.1 mg/dL — ABNORMAL LOW (ref 8.5–10.1)
Chloride: 106 mmol/L (ref 98–107)
Creatinine: 1.32 mg/dL — ABNORMAL HIGH (ref 0.60–1.30)
EGFR (Non-African Amer.): 55 — ABNORMAL LOW
Glucose: 166 mg/dL — ABNORMAL HIGH (ref 65–99)
Osmolality: 286 (ref 275–301)
Potassium: 3.5 mmol/L (ref 3.5–5.1)
Sodium: 138 mmol/L (ref 136–145)

## 2013-04-03 LAB — CULTURE, BLOOD (SINGLE)

## 2013-04-29 ENCOUNTER — Ambulatory Visit: Payer: Federal, State, Local not specified - PPO | Admitting: Cardiovascular Disease

## 2013-05-03 ENCOUNTER — Encounter: Payer: Self-pay | Admitting: Cardiovascular Disease

## 2013-05-03 ENCOUNTER — Ambulatory Visit (INDEPENDENT_AMBULATORY_CARE_PROVIDER_SITE_OTHER): Payer: Federal, State, Local not specified - PPO | Admitting: Cardiovascular Disease

## 2013-05-03 VITALS — BP 122/78 | HR 71 | Ht 67.0 in | Wt 249.2 lb

## 2013-05-03 DIAGNOSIS — E1165 Type 2 diabetes mellitus with hyperglycemia: Secondary | ICD-10-CM | POA: Insufficient documentation

## 2013-05-03 DIAGNOSIS — E669 Obesity, unspecified: Secondary | ICD-10-CM

## 2013-05-03 DIAGNOSIS — E785 Hyperlipidemia, unspecified: Secondary | ICD-10-CM

## 2013-05-03 DIAGNOSIS — I25810 Atherosclerosis of coronary artery bypass graft(s) without angina pectoris: Secondary | ICD-10-CM

## 2013-05-03 DIAGNOSIS — E1121 Type 2 diabetes mellitus with diabetic nephropathy: Secondary | ICD-10-CM | POA: Insufficient documentation

## 2013-05-03 DIAGNOSIS — IMO0001 Reserved for inherently not codable concepts without codable children: Secondary | ICD-10-CM

## 2013-05-03 DIAGNOSIS — R0602 Shortness of breath: Secondary | ICD-10-CM

## 2013-05-03 DIAGNOSIS — I10 Essential (primary) hypertension: Secondary | ICD-10-CM

## 2013-05-03 DIAGNOSIS — I2510 Atherosclerotic heart disease of native coronary artery without angina pectoris: Secondary | ICD-10-CM

## 2013-05-03 DIAGNOSIS — IMO0002 Reserved for concepts with insufficient information to code with codable children: Secondary | ICD-10-CM

## 2013-05-03 NOTE — Assessment & Plan Note (Signed)
Recommended he continue on his Vytorin. Goal LDL less than 70

## 2013-05-03 NOTE — Patient Instructions (Signed)
You are doing well. No medication changes were made.  Please call us if you have new issues that need to be addressed before your next appt.  Your physician wants you to follow-up in: 6 months.  You will receive a reminder letter in the mail two months in advance. If you don't receive a letter, please call our office to schedule the follow-up appointment.   

## 2013-05-03 NOTE — Assessment & Plan Note (Signed)
Currently with no symptoms of angina. No further workup at this time. Continue current medication regimen. 

## 2013-05-03 NOTE — Assessment & Plan Note (Signed)
Recommended he contact Dr. walker. He is frustrated as sugars continue to run in the 200s. This is likely pushing his cholesterol higher as well. Recommended strict diet and weight loss.

## 2013-05-03 NOTE — Assessment & Plan Note (Signed)
We have encouraged continued exercise, careful diet management in an effort to lose weight. 

## 2013-05-03 NOTE — Assessment & Plan Note (Signed)
Blood pressure is well controlled on today's visit. No changes made to the medications. 

## 2013-05-03 NOTE — Progress Notes (Signed)
Patient ID: [name], male    DOB: October 15, 1945, 68 y.o.   MRN: [id number]  HPI Comments: 68 year old gentleman with a history of coronary artery disease, bypass surgery x5 with a LIMA to the LAD, vein graft to the D1, vein graft to the OM1 and OM 2, vein graft to the RCA with repeat catheterization in June 2009 showing 95% stenosis of the ostial LAD with total occlusion of the proximal LAD, 70% proximal circumflex disease with occlusion of the marginal vessel from the AV groove, diffuse RCA disease with 80% proximal disease followed by 95% mid RCA disease, patent vein grafts. He has obesity, diabetes, hypertension, hyperlipidemia.   He has an acoustic neuroma. He continues to debate between pursuing surgery or radiation.  In followup today, he reports that he was recently hospitalized for SIRS, requiring antibiotics. Cardiology was consulted (not our group?) And echocardiogram performed which was essentially normal. He has since recovered but is still fatigued. Is not doing any regular exercise His biggest complaint is his sugars. These are running more than 200 on a regular basis. Since the cortisone shot in his knee and since his hospitalization, this has been running high on a regular basis. Did see Dr. walker recently and no medication changes were made. It was suggested he followup in June.  Continues to have problems with his weight. Previously was off his diabetes medications but was unable to lose enough weight.  Previously has been on a testosterone supplement He reports blood pressure has been relatively well controlled He does not do a regular exercise program.  Previous total cholesterol 159, hemoglobin A1c 6.9 Followup lab work showing total cholesterol greater than 200, hemoglobin A1c greater than 8   EKG shows normal sinus rhythm with rate of 71 beats per minute with no significant ST or T wave changes       Outpatient Encounter Prescriptions as of 05/03/2013  Medication Sig   . aspirin (ASPIR-LOW) 81 MG EC tablet Take 162 mg by mouth daily.   . carvedilol (COREG) 25 MG tablet Take 12.5 mg by mouth 2 (two) times daily with a meal.   . esomeprazole (NEXIUM) 20 MG packet Take 20 mg by mouth daily.  Marland Kitchen ezetimibe-simvastatin (VYTORIN) 10-40 MG per tablet Take 1 tablet by mouth at bedtime.  . furosemide (LASIX) 20 MG tablet Take 20 mg by mouth 2 (two) times daily.  Marland Kitchen glyBURIDE-metformin (GLUCOVANCE) 2.5-500 MG per tablet 3 tab am and 2 tabs qhs  . hydrocortisone (ANUCORT-HC) 25 MG suppository Place 25 mg rectally as needed.  . Lancets (ACCU-CHEK SOFT TOUCH) lancets Use as instructed  . meclizine (ANTIVERT) 25 MG tablet Take 1 tablet (25 mg total) by mouth 3 (three) times daily as needed.  . meloxicam (MOBIC) 15 MG tablet Take 15 mg by mouth daily.   . Multiple Vitamins-Minerals (MULTIVITAL) tablet Take 1 tablet by mouth daily.    . pioglitazone (ACTOS) 45 MG tablet Take 45 mg by mouth daily.   . ramipril (ALTACE) 5 MG capsule TAKE ONE CAPSULE BY MOUTH EVERY DAY  . solifenacin (VESICARE) 10 MG tablet Take 1 tablet (10 mg total) by mouth daily.  . Testosterone (ANDROGEL) 20.25 MG/1.25GM (1.62%) GEL Place onto the skin daily. Two pumps daily.  . vardenafil (LEVITRA) 20 MG tablet Take 20 mg by mouth as needed.  . [DISCONTINUED] furosemide (LASIX) 20 MG tablet Take 1 tablet (20 mg total) by mouth 2 (two) times daily as needed.    Review of Systems  Constitutional: Negative.   HENT: Negative.        Hearing loss  Eyes: Negative.   Respiratory: Negative.   Cardiovascular: Negative.   Gastrointestinal: Negative.   Endocrine: Negative.   Musculoskeletal: Negative.   Skin: Negative.   Allergic/Immunologic: Negative.   Neurological: Negative.   Hematological: Negative.   Psychiatric/Behavioral: Negative.   All other systems reviewed and are negative.   BP 122/78  Pulse 71  Ht 5\' 7"  (1.702 m)  Wt 249 lb 4 oz (113.059 kg)  BMI 39.03 kg/m2  Physical Exam  Nursing  note and vitals reviewed. Constitutional: He is oriented to person, place, and time. He appears well-developed and well-nourished.  obese  HENT:  Head: Normocephalic.  Nose: Nose normal.  Mouth/Throat: Oropharynx is clear and moist.  Eyes: Conjunctivae are normal. Pupils are equal, round, and reactive to light.  Neck: Normal range of motion. Neck supple. No JVD present.  Cardiovascular: Normal rate, regular rhythm, S1 normal, S2 normal, normal heart sounds and intact distal pulses.  Exam reveals no gallop and no friction rub.   No murmur heard. Pulmonary/Chest: Effort normal and breath sounds normal. No respiratory distress. He has no wheezes. He has no rales. He exhibits no tenderness.  Abdominal: Soft. Bowel sounds are normal. He exhibits no distension. There is no tenderness.  Musculoskeletal: Normal range of motion. He exhibits no edema and no tenderness.  Lymphadenopathy:    He has no cervical adenopathy.  Neurological: He is alert and oriented to person, place, and time. Coordination normal.  Skin: Skin is warm and dry. No rash noted. No erythema.  Psychiatric: He has a normal mood and affect. His behavior is normal. Judgment and thought content normal.      Assessment and Plan

## 2013-09-10 ENCOUNTER — Encounter: Payer: Self-pay | Admitting: Internal Medicine

## 2013-09-23 ENCOUNTER — Other Ambulatory Visit: Payer: Self-pay

## 2013-09-23 MED ORDER — RAMIPRIL 5 MG PO CAPS: ORAL_CAPSULE | ORAL | Status: DC

## 2013-10-18 ENCOUNTER — Other Ambulatory Visit: Payer: Self-pay

## 2013-10-30 ENCOUNTER — Ambulatory Visit (INDEPENDENT_AMBULATORY_CARE_PROVIDER_SITE_OTHER): Payer: Federal, State, Local not specified - PPO | Admitting: Cardiovascular Disease

## 2013-10-30 ENCOUNTER — Encounter: Payer: Self-pay | Admitting: Cardiovascular Disease

## 2013-10-30 VITALS — BP 145/78 | HR 83 | Ht 66.5 in | Wt 255.5 lb

## 2013-10-30 DIAGNOSIS — E119 Type 2 diabetes mellitus without complications: Secondary | ICD-10-CM

## 2013-10-30 DIAGNOSIS — D333 Benign neoplasm of cranial nerves: Secondary | ICD-10-CM

## 2013-10-30 DIAGNOSIS — E669 Obesity, unspecified: Secondary | ICD-10-CM

## 2013-10-30 DIAGNOSIS — I25810 Atherosclerosis of coronary artery bypass graft(s) without angina pectoris: Secondary | ICD-10-CM

## 2013-10-30 DIAGNOSIS — I10 Essential (primary) hypertension: Secondary | ICD-10-CM

## 2013-10-30 DIAGNOSIS — I25709 Atherosclerosis of coronary artery bypass graft(s), unspecified, with unspecified angina pectoris: Secondary | ICD-10-CM

## 2013-10-30 DIAGNOSIS — E785 Hyperlipidemia, unspecified: Secondary | ICD-10-CM

## 2013-10-30 MED ORDER — FUROSEMIDE 20 MG PO TABS: 20.0000 mg | ORAL_TABLET | Freq: Two times a day (BID) | ORAL | Status: DC

## 2013-10-30 MED ORDER — CARVEDILOL 25 MG PO TABS: 12.5000 mg | ORAL_TABLET | Freq: Two times a day (BID) | ORAL | Status: DC

## 2013-10-30 MED ORDER — EZETIMIBE-SIMVASTATIN 10-40 MG PO TABS: 1.0000 | ORAL_TABLET | Freq: Every day | ORAL | Status: DC

## 2013-10-30 NOTE — Assessment & Plan Note (Addendum)
Sugar level has been running high. Recommended he work on his portion control, start exercise program

## 2013-10-30 NOTE — Assessment & Plan Note (Signed)
He is reluctant to have surgery. He had 2 rounds of radiation but reports it was a problem with the machine. MRI scheduled in the future

## 2013-10-30 NOTE — Progress Notes (Signed)
Patient ID: [name], male    DOB: 1945-07-28, 68 y.o.   MRN: [id number]  HPI Comments: 68 year old gentleman with a history of coronary artery disease, bypass surgery x5 with a LIMA to the LAD, vein graft to the D1, vein graft to the OM1 and OM 2, vein graft to the RCA with repeat catheterization in June 2009 showing 95% stenosis of the ostial LAD with total occlusion of the proximal LAD, 70% proximal circumflex disease with occlusion of the marginal vessel from the AV groove, diffuse RCA disease with 80% proximal disease followed by 95% mid RCA disease, patent vein grafts. He has obesity, diabetes, hypertension, hyperlipidemia.   He has an acoustic neuroma. He has received 2 rounds of radiation at [facility], scheduled for followup MRI and meeting with a surgeon He presents in followup  In general he reports having no significant chest pain. He has baseline mild shortness of breath with exertion. Weight continues to be a major issue. He does not do any regular exercise. Some minimal leg edema, no significant change. He reports blood pressure has been on the high normal side, systolic pressure 992 Chronic knee problems, previous cortisone shot   hospitalized March 2015 for SIRS, requiring antibiotics. Cardiology was consulted (not our group?) And echocardiogram performed which was essentially normal. He has since recovered but is still fatigued. Is not doing any regular exercise  Total cholesterol 174, HDL 49, glucose 192, hemoglobin A1c 7.6   EKG shows normal sinus rhythm with rate of 83 beats per minute with no significant ST or T wave changes       Outpatient Encounter Prescriptions as of 10/30/2013  Medication Sig  . aspirin (ASPIR-LOW) 81 MG EC tablet Take 162 mg by mouth daily.   . carvedilol (COREG) 25 MG tablet Take 12.5 mg by mouth 2 (two) times daily with a meal.   . esomeprazole (NEXIUM) 20 MG packet Take 20 mg by mouth daily.  Marland Kitchen ezetimibe-simvastatin (VYTORIN) 10-40 MG per  tablet Take 1 tablet by mouth at bedtime.  . furosemide (LASIX) 20 MG tablet Take 20 mg by mouth 2 (two) times daily.  Marland Kitchen glyBURIDE-metformin (GLUCOVANCE) 2.5-500 MG per tablet 3 tab am and 2 tabs qhs  . hydrocortisone (ANUCORT-HC) 25 MG suppository Place 25 mg rectally as needed.  . Lancets (ACCU-CHEK SOFT TOUCH) lancets Use as instructed  . meclizine (ANTIVERT) 25 MG tablet Take 1 tablet (25 mg total) by mouth 3 (three) times daily as needed.  . meloxicam (MOBIC) 15 MG tablet Take 15 mg by mouth daily.   . Multiple Vitamins-Minerals (MULTIVITAL) tablet Take 1 tablet by mouth daily.    . pioglitazone (ACTOS) 45 MG tablet Take 45 mg by mouth daily.   . ramipril (ALTACE) 5 MG capsule TAKE ONE CAPSULE BY MOUTH EVERY DAY  . solifenacin (VESICARE) 10 MG tablet Take 1 tablet (10 mg total) by mouth daily.  . Testosterone (ANDROGEL) 20.25 MG/1.25GM (1.62%) GEL Place onto the skin daily. Two pumps daily.  . vardenafil (LEVITRA) 20 MG tablet Take 20 mg by mouth as needed.    Review of Systems  Constitutional: Negative.   HENT: Negative.        Hearing loss  Eyes: Negative.   Respiratory: Negative.   Cardiovascular: Negative.   Gastrointestinal: Negative.   Endocrine: Negative.   Musculoskeletal: Negative.   Skin: Negative.   Allergic/Immunologic: Negative.   Neurological: Negative.   Hematological: Negative.   Psychiatric/Behavioral: Negative.   All other systems reviewed and are  negative.   BP 150/78  Pulse 83  Ht 5' 6.5" (1.689 m)  Wt 255 lb 8 oz (115.894 kg)  BMI 40.63 kg/m2  Physical Exam  Nursing note and vitals reviewed. Constitutional: He is oriented to person, place, and time. He appears well-developed and well-nourished.  obese  HENT:  Head: Normocephalic.  Nose: Nose normal.  Mouth/Throat: Oropharynx is clear and moist.  Eyes: Conjunctivae are normal. Pupils are equal, round, and reactive to light.  Neck: Normal range of motion. Neck supple. No JVD present.   Cardiovascular: Normal rate, regular rhythm, S1 normal, S2 normal, normal heart sounds and intact distal pulses.  Exam reveals no gallop and no friction rub.   No murmur heard. Pulmonary/Chest: Effort normal and breath sounds normal. No respiratory distress. He has no wheezes. He has no rales. He exhibits no tenderness.  Abdominal: Soft. Bowel sounds are normal. He exhibits no distension. There is no tenderness.  Musculoskeletal: Normal range of motion. He exhibits no edema and no tenderness.  Lymphadenopathy:    He has no cervical adenopathy.  Neurological: He is alert and oriented to person, place, and time. Coordination normal.  Skin: Skin is warm and dry. No rash noted. No erythema.  Psychiatric: He has a normal mood and affect. His behavior is normal. Judgment and thought content normal.      Assessment and Plan

## 2013-10-30 NOTE — Patient Instructions (Addendum)
You are doing well. No medication changes were made.  Please call us if you have new issues that need to be addressed before your next appt.  Your physician wants you to follow-up in: 6 months.  You will receive a reminder letter in the mail two months in advance. If you don't receive a letter, please call our office to schedule the follow-up appointment.  Your next appointment will be scheduled in our new office located at :  [facility]  [address], [address]  [place], [place] [address]  

## 2013-10-30 NOTE — Assessment & Plan Note (Signed)
Currently with no symptoms of angina. No further workup at this time. Continue current medication regimen. 

## 2013-10-30 NOTE — Assessment & Plan Note (Signed)
We have encouraged continued exercise, careful diet management in an effort to lose weight. No medication changes made

## 2013-10-30 NOTE — Assessment & Plan Note (Signed)
Cholesterol above goal. Recommended weight loss, stay on his statin

## 2013-10-30 NOTE — Assessment & Plan Note (Addendum)
Weight is a major issue. Ideally would benefit from gastric bypass but he would be high risk

## 2013-12-17 DIAGNOSIS — H903 Sensorineural hearing loss, bilateral: Secondary | ICD-10-CM | POA: Insufficient documentation

## 2014-01-03 DIAGNOSIS — D333 Benign neoplasm of cranial nerves: Secondary | ICD-10-CM

## 2014-01-03 HISTORY — DX: Benign neoplasm of cranial nerves: D33.3

## 2014-04-25 ENCOUNTER — Telehealth: Payer: Self-pay | Admitting: *Deleted

## 2014-04-25 NOTE — Telephone Encounter (Signed)
Pt c/o BP issue: STAT if pt c/o blurred vision, one-sided weakness or slurred speech  1. What are your last 5 BP readings? Past week 150-180 can't get it lower than this.  Today 157/79 Yesterday 177/92  04/23/14 176/94 04/21/14 178/88 04/17/14  152/90  2. Are you having any other symptoms (ex. Dizziness, headache, blurred vision, passed out)? Headaches.   3. What is your BP issue? Can't get it lower from 150-180

## 2014-04-25 NOTE — Telephone Encounter (Signed)
Pt has appt on 05/07/14 for 6 mo f/u. He would like to know if any med changes need to be made before that time.

## 2014-04-26 NOTE — Discharge Summary (Signed)
PATIENT NAME:  [name], GOERNER MR#:  [id number] DATE OF BIRTH:  04/14/1945  DATE OF ADMISSION:  03/29/2013 DATE OF DISCHARGE:  04/01/2013  HISTORY OF PRESENT ILLNESS: Mr. Collister is a 69 year old white gentleman, who presented to the Emergency Room with increasing shortness of breath, cough and fever. In the Emergency Room, he was noted to have high fevers, tachypnea and elevated white count. He was therefore admitted.   PAST MEDICAL HISTORY: Notable for hypertension, coronary artery disease, hyperlipidemia, testosterone deficiency and right acoustic neuroma.   PAST SURGICAL HISTORY: Included a previous cervical laminectomy and a previous lumbar laminectomy.   ALLERGIES: CODEINE.   ADMISSION MEDICATIONS: Not listed by the admitting physician.   ADMISSION PHYSICAL EXAMINATION: Revealed temperature 102.9, pulse 134, respirations 20, blood pressure 125/93 and oxygen sat of 93%. Examination as described by the admitting physician revealed both rales and wheezes with intermittent rhonchi on auscultation of the chest. He was noted to have labored respirations. The patient was also noted to have 1 to 2+ stasis edema of the lower extremities. The remainder of the examination was described as unremarkable.   Admission EKG showed a sinus tachycardia with low voltage. Admission metabolic panel showed a blood sugar of 263, a BUN of 20 with a creatinine of 1.36 and normal electrolytes. LFTs were normal. Troponin was unremarkable. White count was 15,000. Hemoglobin was 13.2. Platelet count was 214,000. Chest x-ray showed increased pulmonary interstitial markings and mild cardiac enlargement. No acute pneumonia was noted.   HOSPITAL COURSE: The patient was admitted to the regular floor where he was rehydrated with IV fluids. He was started on supplemental oxygen. He was also placed on a pulmonary toilet. The patient was placed on Rocephin and Zithromax pending culture reports. He was continued on Lasix for his  history of congestive heart failure. Cardiac enzymes were run. An echocardiogram was obtained.   Echocardiogram showed normal left ventricular function and ejection fraction of 60% to 65%. Blood cultures grew out coag-negative staph in anaerobic bottle times one only. It was felt to be a contaminant. Urine culture showed no growth. Sputum showed many white cells, but normal flora.   The patient's hospital course was one of rather rapid improvement. He quickly became afebrile and his respiratory distress became unlabored. He was converted to p.o. medications and ambulated without difficulty.   DISCHARGE DIAGNOSIS: Systemic inflammatory response syndrome due to acute bronchitis.   DISCHARGE MEDICATIONS:  1.  Aspirin 81 mg daily.  2.  Coreg 12.5 mg b.i.d.  3.  Nexium 20 mg daily.  4.  Vytorin 10/20 mg daily.  5.  Furosemide 40 mg daily.  6.  Ramipril 5 mg daily.  7.  Solifenacin 10 mg daily.  8.  AndroGel pump, 1 application daily.  9.  Vardenafil 20 mg 1 tablet daily.  10. Glyburide 1.5 mg daily.  11. Metformin 1000 mg b.i.d.  12. Symbicort 160/4.5 inhaler 2 puffs b.i.d.  13. Azithromycin 250 mg daily for an additional 5 days.   DIET: The patient was discharged on a low-sodium, low-fat carbohydrate-controlled diet.   FOLLOWUP: He is to be followed up in the office in 1 to 2 weeks.  ____________________________ Hewitt Blade [name], MD jbw:aw D: 04/17/2013 07:53:56 ET T: 04/17/2013 08:08:51 ET JOB#: [id number]  cc: Jenny Reichmann B. [name], MD, <Dictator> Lottie Mussel III MD ELECTRONICALLY SIGNED 04/18/2013 7:29

## 2014-04-26 NOTE — H&P (Signed)
PATIENT NAME:  [name], PENN MR#:  [id number] DATE OF BIRTH:  January 14, 1945  DATE OF ADMISSION:  03/29/2013  PRIMARY CARE PHYSICIAN:  Dr. Gilford Rile   HISTORY OF PRESENT ILLNESS:  The patient is a 69 year old Caucasian male with past medical history significant for history of right acoustic neuroma, history of hepatitis, hyperlipidemia, hypertension, diabetes mellitus, coronary artery disease with coronary artery bypass grafting, who presents to the hospital with complaints of significant shortness of breath.  According to the patient, he was doing well up until a few days ago when he started having problems with worsening shortness of breath.  He was tachycardic and was febrile to 102 and above in the Emergency Room.  He also was complaining of lower extremity edema.  He does admit that he is not able to lie down flat because of significant shortness of breath.  He has been coughing and bringing up yellow phlegm.  He is also wheezing.  He admits of having significant vertigo over the past 2 days.  He tells me that it is very difficult for him to do his job, carrying a heavy load because of significant back pains.  Overall he feels very achy and very uncomfortable.  He decided to come to the Emergency Room for further evaluation.  He did not even know that he was having high fevers.  In the Emergency Room, he was noted to be tachycardic, having high fevers and hospitalist services were contacted for admission.   PAST MEDICAL HISTORY:  Significant for history of multiple medical issues including androgen deficiency, hyperlipidemia, hypertension, coronary artery disease, coronary artery bypass grafting x5 in 2003, no cardiomyopathy, right acoustic neuroma being followed for.  He has difficulty with balance as well as hearing loss, tendency to fall. Pancreatitis, hyperlipidemia, hypertension, diabetes mellitus, back surgery in 1996, neck surgery due to ruptured disk, has a plate.  ALLERGIES:  CODEINE.   FAMILY  HISTORY:  The patient's father died at the age of 36 of MI.  He also was diabetic.  Multiple family members with diabetes in the family including brothers as well as sisters.  Five children total.  The patient's mother in her 20s now, she is alive.  She has some troubles with her heart and she is house-bound; however, she is still alive.  She also has a history of hypertension and skin cancer.  The patient's brother had lymph node cancer in the neck.    SOCIAL HISTORY:  The patient is married twice.  He has 2 daughters.  He raised a few more other children as well.  He does not smoke, he never did and never drank any alcohol.    REVIEW OF SYSTEMS:   CONSTITUTIONAL: Positive for feeling sweaty for the past few days, fatigue and weak.  Some mild cataracts which do not need to be removed, for which he uses bifocal glasses. He was tested for obstructive sleep apnea, he does not have it.  He has significant nasal drip.  Also very significant cough, wheezes, as well as hemoptysis, and shortness of breath.  Admits to orthopnea, arrhythmias and feeling presyncopal and very dizzy earlier today as well as nauseated.  Denies any fevers.  He did not notice any; however, he was noted to have high fevers here in the Emergency Room.  Denies any significant pains, weight loss or gain.  EYES:  Denies any blurry vision, double vision, glaucoma. EARS, NOSE, THROAT:  Denies any tinnitus, allergies, epistaxis, sinus pain, dentures, difficulty swallowing. RESPIRATORY:  Denies  any asthma, COPD or painful respirations.  CARDIOVASCULAR:  Denies any chest pains.  Admits of lower extremity edema.  Denies any palpitations, however admits of having some intermittent arrhythmias intermittently.  GASTROINTESTINAL:  Denies any vomiting, diarrhea, rectal bleeding, change in bowel habits.  GENITOURINARY:  Denies dysuria, hematuria, frequency, incontinence.  ENDOCRINE:  Denies any polydipsia, nocturia, thyroid problems, heat or cold  intolerance or thirst. HEMATOLOGIC:  Denies any anemia, easy bruising, bleeding or swollen glands. SKIN:  Denies any acne, rash, lesions or change in moles.  MUSCULOSKELETAL:  Denies arthritis, cramps, swelling, or gout.  NEUROLOGIC:  Denies numbness, epilepsy or tremors. PSYCHIATRIC:  Denies anxiety, insomnia or depression.   PHYSICAL EXAMINATION: VITAL SIGNS:  On arrival to the hospital, temperature was 102.9, pulse 134, respiration rate was 20, blood pressure 125/93, saturation was 93% on room air.  GENERAL:  This is a well-developed, well-nourished, obese Caucasian male in moderate to severe respiratory distress, sitting on the stretcher and breathing heavily.  His respiration rate is high.  HEENT:  His pupils equal, reactive to light.  Extraocular movements intact.  No icterus or conjunctivitis. He has normal hearing.  No pharyngeal erythema.  Mucosa is moist. NECK:  No masses.  Supple, nontender.  Thyroid not enlarged.  No adenopathy.  No JVD or carotid bruits bilaterally.  Full range of motion.  LUNGS:  Rales and wheezes as well as intermittent rhonchi, poor air entrance bilaterally.  The patient does have labored inspirations as well as increased effort to breathe, in moderate respiratory distress.  CARDIOVASCULAR:  S1, S2 appreciated.  Rhythm is regular.  PMI not lateralized.  Chest is nontender to palpation.  Pedal pulses 1+, and 1 to 2+ lower extremity edema.  No calf tenderness or cyanosis were noted.  ABDOMEN:  Soft, nontender.  Bowel sounds are present.  No hepatosplenomegaly or masses were noted.  RECTAL:  Deferred.  MUSCLE STRENGTH:  Able to move all extremities.  No cyanosis, degenerative joint disease or kyphosis.  Gait is not tested.  SKIN:  Denies any rashes, lesions, erythema, nodularity or induration.  It was warm and dry to palpation.  LYMPHATIC:  No adenopathy in the cervical region.  NEUROLOGIC:  Cranial nerves grossly intact.  Sensory is intact.  No dysarthria or aphasia.   The patient is alert, oriented to time, person and place, cooperative.  Memory is good.  Denies any significant confusion, agitation or depression were noted.    LABORATORY DATA:  EKG done in the Emergency Room, 03/29/2013 revealed sinus tachy at 120 beats per minute, low voltage QRS, borderline EKG.  No acute ST-T changes were noted.  Glucose of 263, BUN and creatinine were 20 and 1.36, otherwise BMP was unremarkable.  The patient's liver enzymes were normal.  Cardiac enzymes, first set less than 0.02.  Troponin less than 0.02.  White blood cell count is elevated to 15.0, hemoglobin was 13.2, platelet count was 214.  Absolute neutrophil count is high at 12.7.  Chest x-ray, PA and lateral, 03/29/2013, revealed increased pulmonary interstitial markings in the setting of mild cardiac silhouette enlargement and previous coronary artery bypass grafting, is worrisome for low-grade CHF.  There is no alveolar pneumonia.  Followup films following therapy would be of value to reassess pulmonary interstitium according to radiology.   ASSESSMENT AND PLAN: 1.  Systemic inflammatory response syndrome with high fever, leukocytosis as well as tachycardia.  Admit the patient to the medical floor.  Get blood cultures, sputum cultures as well as get urinalysis with  urine cultures.  Start antibiotic therapy.  Follow culture results.  2.  Pneumonia.  As above, we will get sputum cultures.  Start the patient on Rocephin as well as Zithromax.  3.  Renal insufficiency.  We will follow with Lasix therapy, since Lasix is initiated IV now.  4.  Congestive heart failure with lower extremity swelling.  Continue Lasix for now.  Hold ACE inhibitor at this time due to renal insufficiency.  Get echocardiogram as well as cardiology consultation.  We will check cardiac enzymes x3.  The patient has no chest pains.  We will continue the patient on heparin subcutaneously as well as his outpatient medications.  5.  Diabetes mellitus.  We will  continue home medications except metformin.  We will continue sliding scale insulin for now.  Get hemoglobin A1c.  6.  Leukocytosis.  We will follow with antibiotic therapy.   TIME SPENT:  50 minutes.    ____________________________ Theodoro Grist, MD rv:ea D: 03/29/2013 20:35:22 ET T: 03/30/2013 00:54:38 ET JOB#: [id number]  cc: Theodoro Grist, MD, <Dictator> Dr. Jimmey Ralph MD ELECTRONICALLY SIGNED 04/09/2013 21:19

## 2014-04-26 NOTE — H&P (Signed)
PATIENT NAME:  KHALIN, ROYCE MR#:  [id number] DATE OF BIRTH:  01/22/1945  DATE OF ADMISSION:  03/29/2013  Addendum  MEDICATION LIST:  AndroGel pump 1.25 mg actuation topically daily, aspirin 81 mg p.o. daily, carvedilol 12.5 mg p.o. twice daily, omeprazole 10 mg p.o. daily, Zetia/simvastatin 10/20  mg once daily, furosemide 40 mg p.o. daily, glyburide micronized 1.5 mg daily, metformin 1 gram twice daily, Mylanta orally as needed, ramipril 5 mg p.o. daily, solifenacin, which is Vesicare, 10 mg p.o. daily and vardenafil 20 mg p.o. daily as needed.    ____________________________ Theodoro Grist, MD rv:dmm D: 03/29/2013 20:39:41 ET T: 03/29/2013 21:27:32 ET JOB#: [id number]  cc: Theodoro Grist, MD, <Dictator> Juell Radney MD ELECTRONICALLY SIGNED 04/09/2013 21:18

## 2014-04-26 NOTE — Consult Note (Signed)
Brief Consult Note: Diagnosis: fever, chills and sob.   Patient was seen by consultant.   Recommend further assessment or treatment.   Comments: 69 yo male with history of cad s/p cabg in 2002 at [facility] who was admitted with progressive shortness of breath, fever and chills. CXR showed possible pulmonary edema with no airspace disease. He was placed on emperic antibiotics and iv lasix and has imprvoed to his baseline this am. Would agree with emperic antibiotics and reduceing lasix to po. Echo pending. Has ruled out for mi. Will conitnue home meds otherwise and follow. Further recs after echo.  Electronic Signatures: Teodoro Spray (MD)  (Signed 28-Mar-15 08:50)  Authored: Brief Consult Note   Last Updated: 28-Mar-15 08:50 by Teodoro Spray (MD)

## 2014-04-27 ENCOUNTER — Other Ambulatory Visit: Payer: Self-pay | Admitting: Cardiovascular Disease

## 2014-04-27 MED ORDER — RAMIPRIL 10 MG PO CAPS: ORAL_CAPSULE | ORAL | Status: DC

## 2014-04-27 NOTE — Telephone Encounter (Signed)
Would increase ramipril up to 10 mg twice a day Up from 5 mg daily Prescription has been sent in to mail order pharmacy Continue to monitor blood pressure

## 2014-04-28 NOTE — Telephone Encounter (Signed)
Spoke w/ [name].  Advised him of Dr. Donivan Scull recommendation.  He is agreeable, though he does ask that we remove the mail order rx from his list, as it costs more.

## 2014-05-07 ENCOUNTER — Ambulatory Visit (INDEPENDENT_AMBULATORY_CARE_PROVIDER_SITE_OTHER): Payer: Federal, State, Local not specified - PPO | Admitting: Cardiovascular Disease

## 2014-05-07 ENCOUNTER — Encounter: Payer: Self-pay | Admitting: Cardiovascular Disease

## 2014-05-07 VITALS — BP 140/70 | HR 78 | Ht 66.5 in | Wt 259.8 lb

## 2014-05-07 DIAGNOSIS — D333 Benign neoplasm of cranial nerves: Secondary | ICD-10-CM

## 2014-05-07 DIAGNOSIS — I25810 Atherosclerosis of coronary artery bypass graft(s) without angina pectoris: Secondary | ICD-10-CM

## 2014-05-07 DIAGNOSIS — E785 Hyperlipidemia, unspecified: Secondary | ICD-10-CM

## 2014-05-07 DIAGNOSIS — E119 Type 2 diabetes mellitus without complications: Secondary | ICD-10-CM

## 2014-05-07 DIAGNOSIS — I10 Essential (primary) hypertension: Secondary | ICD-10-CM

## 2014-05-07 DIAGNOSIS — I25709 Atherosclerosis of coronary artery bypass graft(s), unspecified, with unspecified angina pectoris: Secondary | ICD-10-CM | POA: Diagnosis not present

## 2014-05-07 MED ORDER — AMLODIPINE BESYLATE 10 MG PO TABS: 10.0000 mg | ORAL_TABLET | Freq: Every day | ORAL | Status: DC

## 2014-05-07 NOTE — Assessment & Plan Note (Signed)
Stable, currently with no angina

## 2014-05-07 NOTE — Assessment & Plan Note (Signed)
We have encouraged continued exercise, careful diet management in an effort to lose weight. 

## 2014-05-07 NOTE — Assessment & Plan Note (Signed)
Currently with no symptoms of angina. No further workup at this time. Continue current medication regimen. 

## 2014-05-07 NOTE — Patient Instructions (Addendum)
You are doing well.  Please start amlodipine/norvasc 5 mg daily (1/2 pill) Continue to monitor your blood pressure If needed, you can increase the amlodipine up to a full pill daily  If needed for blood pressure,  We could increase the ramapril up to 10 mg twice a day  Please call us if you have new issues that need to be addressed before your next appt.  Your physician wants you to follow-up in: 6 months.  You will receive a reminder letter in the mail two months in advance. If you don't receive a letter, please call our office to schedule the follow-up appointment.

## 2014-05-07 NOTE — Assessment & Plan Note (Signed)
We have recommended that he start amlodipine 5 mg daily If blood pressure continues to run high, could increase up to 10 mg daily. Would be to decrease or hold the medication for worsening leg edema Additionally, ramipril dose could be increased up to 10 mg twice a day for hypertension

## 2014-05-07 NOTE — Assessment & Plan Note (Signed)
Cholesterol close to goal Recommended weight loss, stay on vytorin.

## 2014-05-07 NOTE — Progress Notes (Signed)
Patient ID: [name], male    DOB: 1945-06-14, 69 y.o.   MRN: [id number]  HPI Comments: 69 year old gentleman with a history of coronary artery disease, bypass surgery x5 with a LIMA to the LAD, vein graft to the D1, vein graft to the OM1 and OM 2, vein graft to the RCA with repeat catheterization in June 2009 showing 95% stenosis of the ostial LAD with total occlusion of the proximal LAD, 70% proximal circumflex disease with occlusion of the marginal vessel from the AV groove, diffuse RCA disease with 80% proximal disease followed by 95% mid RCA disease, patent vein grafts. He has obesity, diabetes, hypertension, hyperlipidemia.   He has an acoustic neuroma. He has received 2 rounds of radiation at [facility], scheduled for followup MRI and meeting with a surgeon He presents in followup of his coronary artery disease  In follow-up today, he reports that he was having treatment for his acoustic neuroma at [facility], Was sent to [facility] for radiation treatment. He reports that the machine broke. Radiation was likely not delivered. Because there is no definitive proof of treatment, he has not been referred to alternate center for treatment with radiation as they do not want to do radiation twice. He has follow-up at [facility] periodically to monitor the size of his neuroma. 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 He is not active at baseline, no regular exercise program.  Blood pressure has been running high. We've previously recommended he increase ramipril up to 10 mg twice a day. He is unable taking 5 mg twice a day. . Review of lab work with him December 2015 shows total cholesterol 158, LDL 72  EKG shows normal sinus rhythm with rate of 83 beats per minute with no significant ST or T wave changes   Other past medical history baseline mild shortness of breath with exertion. Weight continues to be a major issue. Some minimal leg  edema, no significant change. Chronic knee problems, previous cortisone shot   hospitalized March 2015 for SIRS, requiring antibiotics. Cardiology was consulted (not our group?) And echocardiogram performed which was essentially normal. He has since recovered but is still fatigued. Is not doing any regular exercise  Total cholesterol 174, HDL 49, glucose 192, hemoglobin A1c 7.6    Allergies  Allergen Reactions  . Codeine     Dizzy    Current Outpatient Prescriptions on File Prior to Visit  Medication Sig Dispense Refill  . carvedilol (COREG) 25 MG tablet Take 0.5 tablets (12.5 mg total) by mouth 2 (two) times daily with a meal. 30 tablet 6  . esomeprazole (NEXIUM) 20 MG packet Take 20 mg by mouth daily. 90 each 3  . ezetimibe-simvastatin (VYTORIN) 10-40 MG per tablet Take 1 tablet by mouth at bedtime. 90 tablet 3  . hydrocortisone (ANUCORT-HC) 25 MG suppository Place 25 mg rectally as needed.    . Lancets (ACCU-CHEK SOFT TOUCH) lancets Use as instructed 100 each 12  . meclizine (ANTIVERT) 25 MG tablet Take 1 tablet (25 mg total) by mouth 3 (three) times daily as needed. 30 tablet 3  . Multiple Vitamins-Minerals (MULTIVITAL) tablet Take 1 tablet by mouth daily.      . pioglitazone (ACTOS) 45 MG tablet Take 45 mg by mouth daily.     . solifenacin (VESICARE) 10 MG tablet Take 1 tablet (10 mg total) by mouth daily. 90 tablet 3  . Testosterone (ANDROGEL) 20.25 MG/1.25GM (1.62%) GEL Place onto  the skin daily. Two pumps daily.    . vardenafil (LEVITRA) 20 MG tablet Take 20 mg by mouth as needed.     No current facility-administered medications on file prior to visit.    Past Medical History  Diagnosis Date  . HLD (hyperlipidemia)   . CAD (coronary artery disease)     multivessel diffuse disease  . HTN (hypertension)   . Hearing loss in right ear   . Hoarseness   . AN (acoustic neuroma) 2012    by MRi, Juengel  . History of pancreatitis 1996    idiopathic  . Diabetes mellitus      secondary to pancreatitis    Past Surgical History  Procedure Laterality Date  . Coronary artery bypass graft  NOV 2007    x5  . Carpal tunnel release    . Back surgery    . Cyst removal of right hand    . Ruptured disk surgery      neck   . Coronary angioplasty with stent placement    . Basal cell carcinoma excision  2013    removed from right side of face    Social History  reports that he has never smoked. He has never used smokeless tobacco. He reports that he does not drink alcohol or use illicit drugs.  Family History family history includes Cancer in his other; Coronary artery disease in his brother and other; Diabetes in his other; Heart attack in his father; Heart disease in his mother; Heart failure in his mother; Hyperlipidemia in his mother; Hypertension in his mother.   Review of Systems  Constitutional: Negative.   HENT:       Hearing loss  Respiratory: Negative.   Cardiovascular: Negative.   Gastrointestinal: Negative.   Endocrine: Negative.   Musculoskeletal: Negative.   Skin: Negative.   Allergic/Immunologic: Negative.   Neurological: Positive for dizziness and light-headedness.       Tinnitus  Hematological: Negative.   Psychiatric/Behavioral: Negative.   All other systems reviewed and are negative.   BP 140/70 mmHg  Pulse 78  Ht 5' 6.5" (1.689 m)  Wt 259 lb 12 oz (117.822 kg)  BMI 41.30 kg/m2  Physical Exam  Constitutional: He is oriented to person, place, and time. He appears well-developed and well-nourished.  obese  HENT:  Head: Normocephalic.  Nose: Nose normal.  Mouth/Throat: Oropharynx is clear and moist.  Eyes: Conjunctivae are normal. Pupils are equal, round, and reactive to light.  Neck: Normal range of motion. Neck supple. No JVD present.  Cardiovascular: Normal rate, regular rhythm, S1 normal, S2 normal, normal heart sounds and intact distal pulses.  Exam reveals no gallop and no friction rub.   No murmur heard. Pulmonary/Chest:  Effort normal and breath sounds normal. No respiratory distress. He has no wheezes. He has no rales. He exhibits no tenderness.  Abdominal: Soft. Bowel sounds are normal. He exhibits no distension. There is no tenderness.  Musculoskeletal: Normal range of motion. He exhibits no edema or tenderness.  Lymphadenopathy:    He has no cervical adenopathy.  Neurological: He is alert and oriented to person, place, and time. Coordination normal.  Skin: Skin is warm and dry. No rash noted. No erythema.  Psychiatric: He has a normal mood and affect. His behavior is normal. Judgment and thought content normal.      Assessment and Plan   Nursing note and vitals reviewed.

## 2014-05-07 NOTE — Assessment & Plan Note (Signed)
He reports frustration concerning his treatment. Some problems with the radiation/treatment regimen at [facility]. Underlying tinnitus, vertigo, dizziness

## 2014-06-06 ENCOUNTER — Telehealth: Payer: Self-pay

## 2014-06-06 NOTE — Telephone Encounter (Signed)
Pt states his Ramipril needs to be 10mg , twice a day, he currently takes 1 mg twice a day. Please call

## 2014-06-25 LAB — HM HEPATITIS C SCREENING LAB: HM Hepatitis Screen: NEGATIVE

## 2014-06-30 ENCOUNTER — Other Ambulatory Visit: Payer: Self-pay

## 2014-10-17 DIAGNOSIS — I6523 Occlusion and stenosis of bilateral carotid arteries: Secondary | ICD-10-CM | POA: Insufficient documentation

## 2014-11-04 DIAGNOSIS — I340 Nonrheumatic mitral (valve) insufficiency: Secondary | ICD-10-CM | POA: Insufficient documentation

## 2014-11-04 DIAGNOSIS — I071 Rheumatic tricuspid insufficiency: Secondary | ICD-10-CM | POA: Insufficient documentation

## 2014-11-20 ENCOUNTER — Telehealth: Payer: Self-pay | Admitting: Cardiovascular Disease

## 2014-11-20 NOTE — Telephone Encounter (Signed)
Attempted to schedule fu from recall list.  Patient not interested in scheduling.  He has scitched providers as the last time he needed a refill he had difficulties with our office.  Per Patient Dr. Rockey Situ is nice but he had to find another provider office to get his meds.   Deleting Recall.  Apologized for situation and advised to call office if needed in future.

## 2015-06-26 DIAGNOSIS — D519 Vitamin B12 deficiency anemia, unspecified: Secondary | ICD-10-CM | POA: Insufficient documentation

## 2015-11-25 IMAGING — CR DG CHEST 2V
1 series · 2 of 2 positions shown · non-contrast
Comparison: None.

CLINICAL DATA: One month history of cough

EXAM:
CHEST  2 VIEW

[Series 1: w chest pa · 0.14mm/px · 2 of 2 slices shown]
[im 1/2]
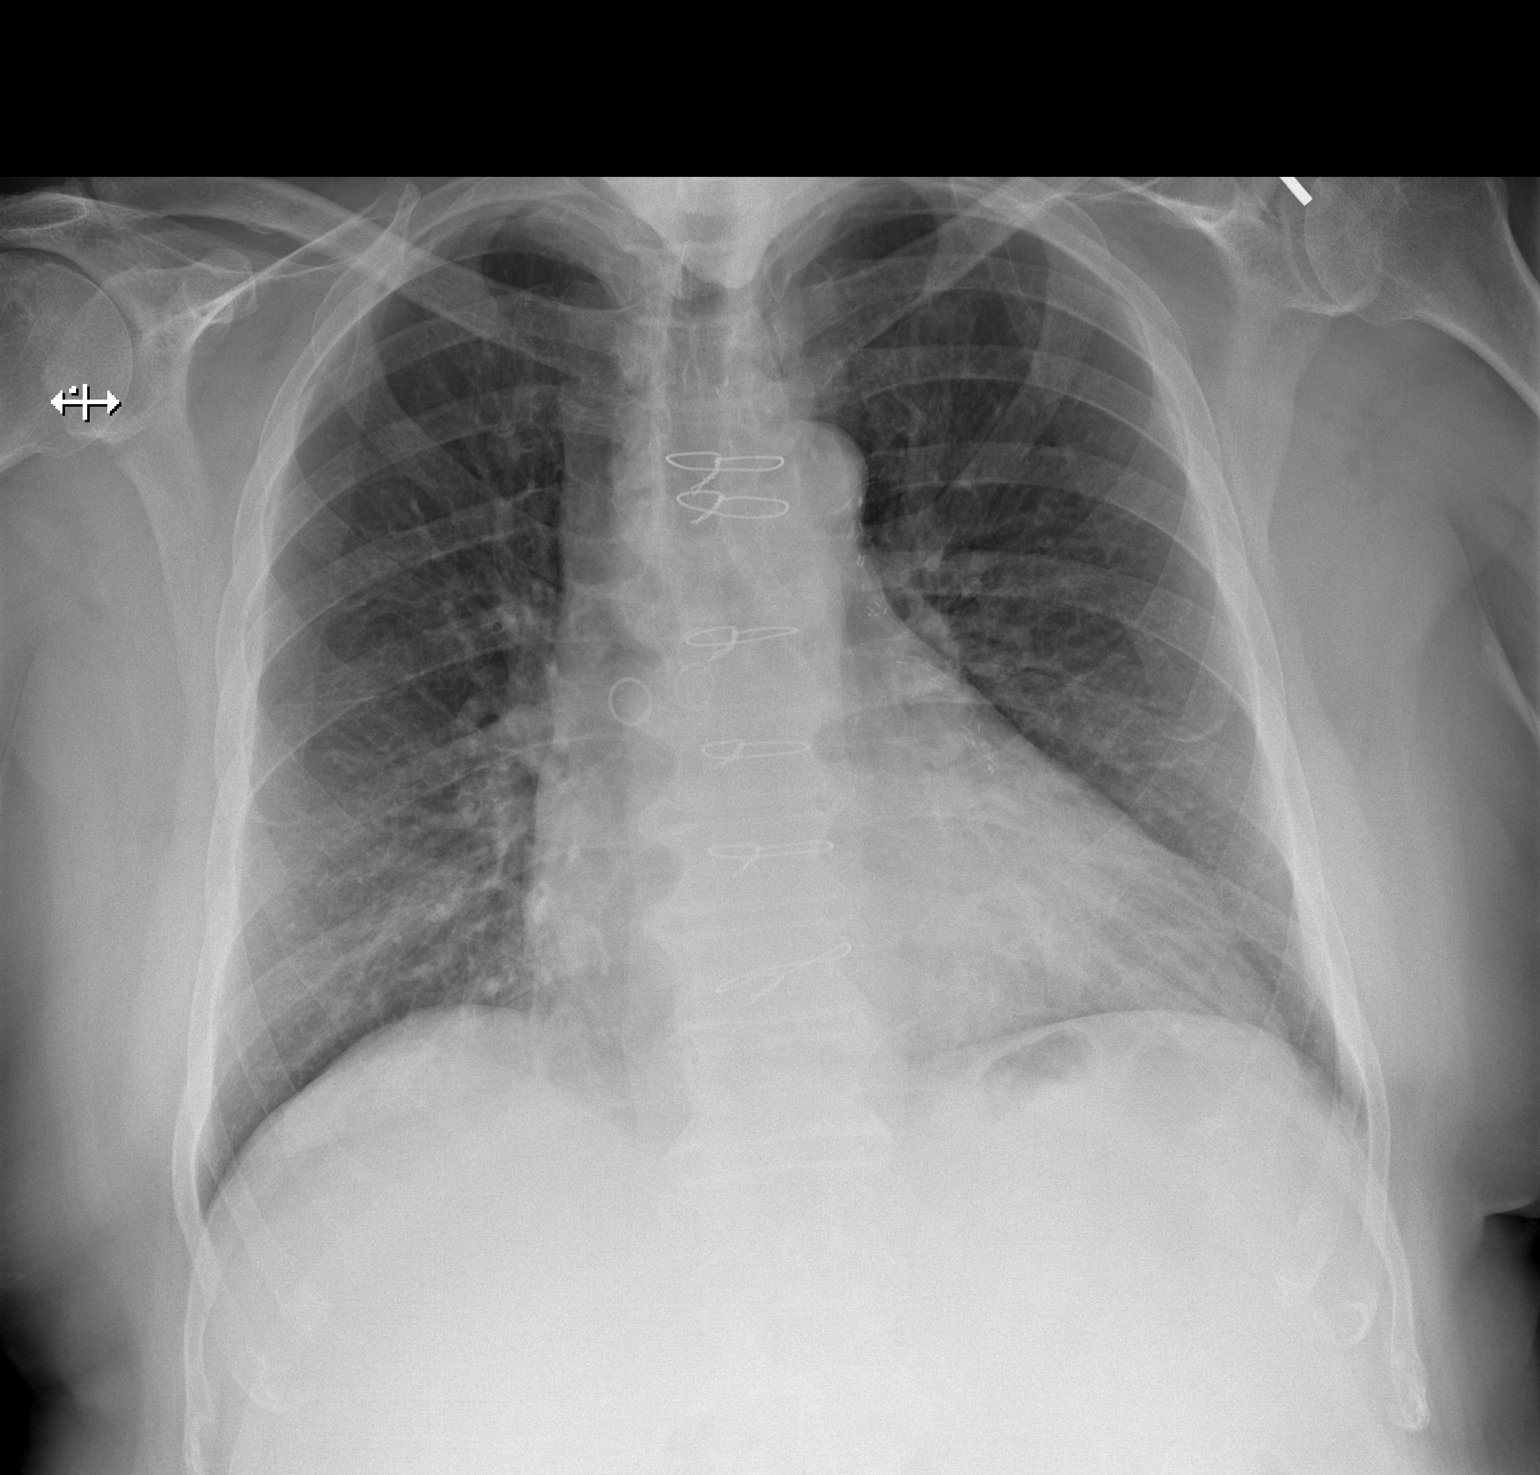
[im 2/2]
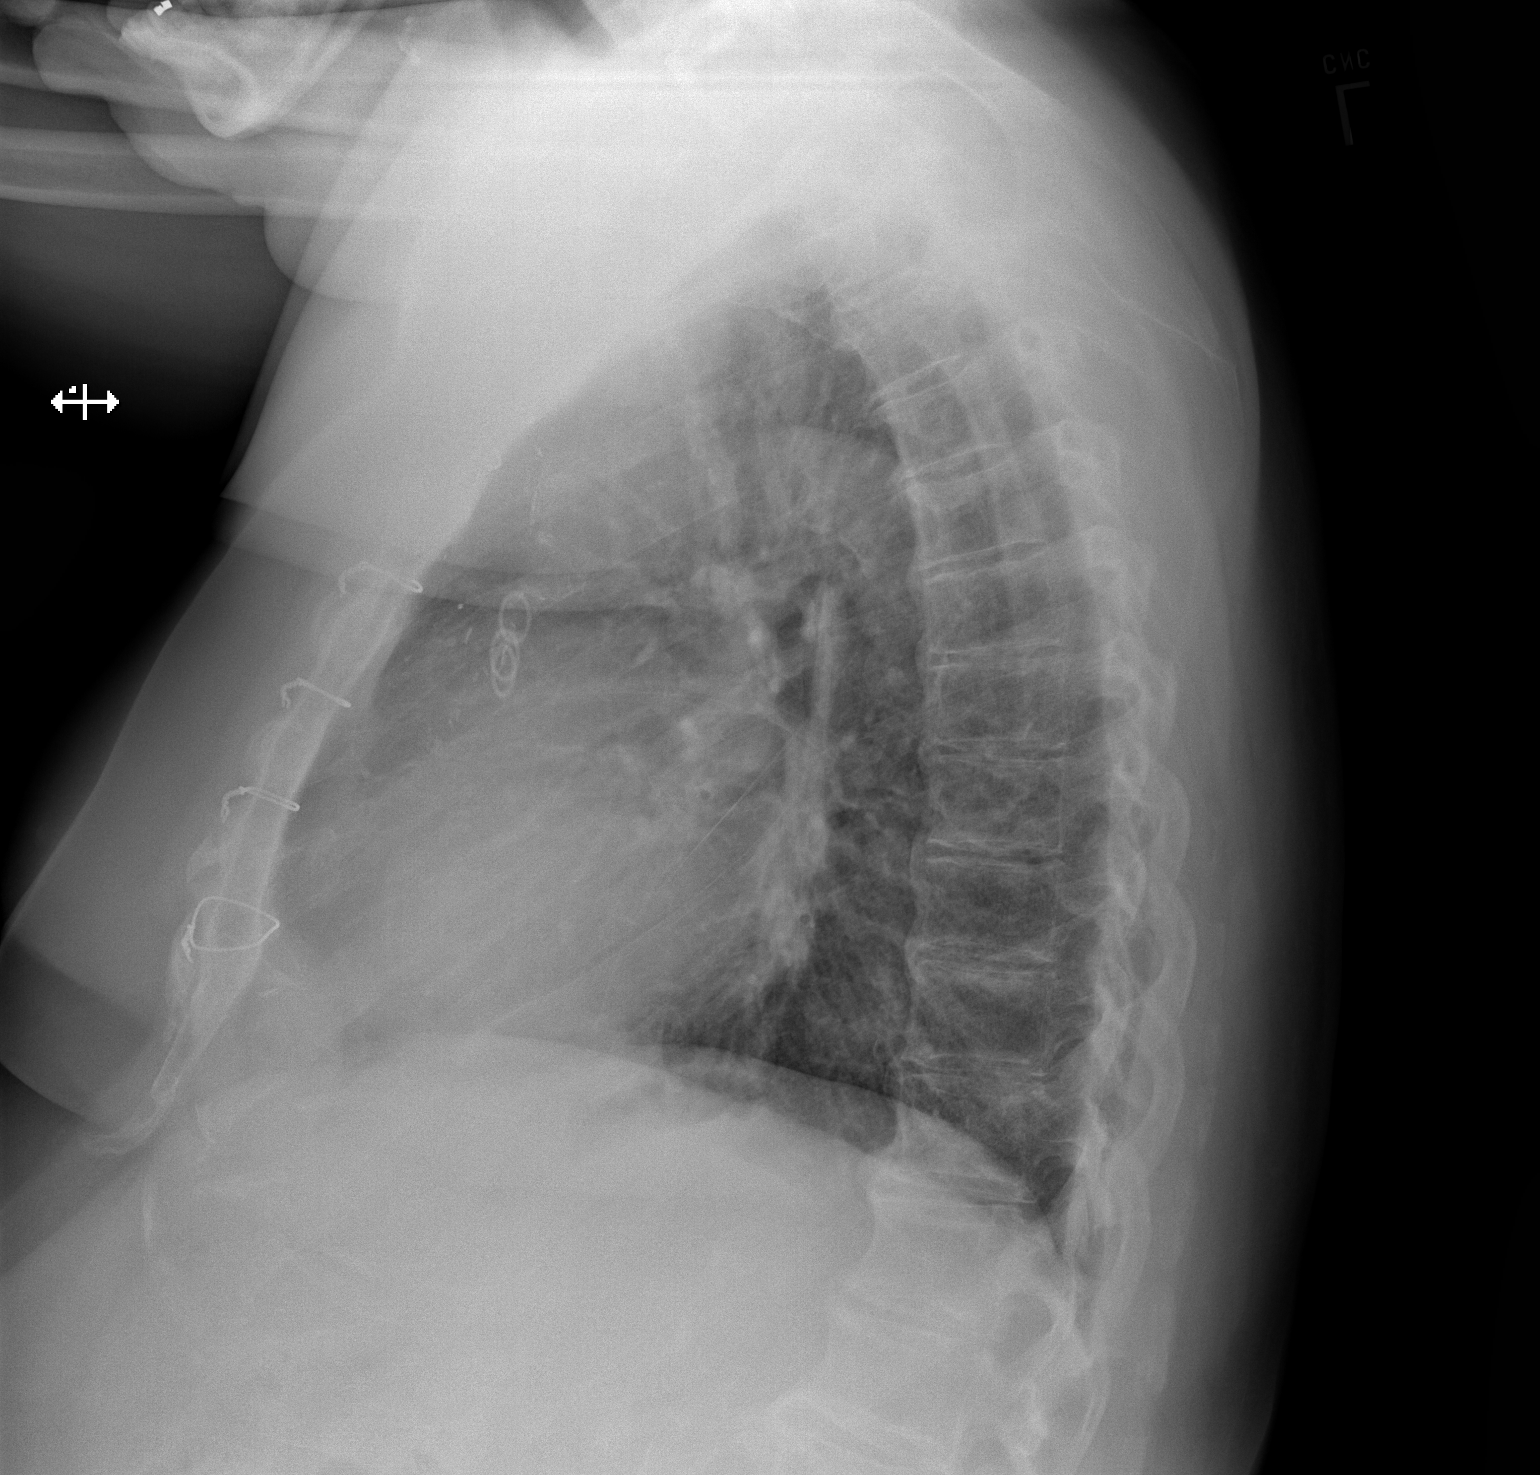

[2 of 2 positions shown; findings below may reference images not displayed]

FINDINGS: The lungs are well-expanded. There is no alveolar infiltrate or
pleural effusion. The interstitial markings are mildly increased
bilaterally. The cardiopericardial silhouette is enlarged. The
patient has undergone previous CABG. The pulmonary vascularity is
mildly prominent centrally. The observed portions of the bony thorax
exhibit no acute abnormalities.
IMPRESSION: Increased pulmonary interstitial markings in the setting of mild
cardiac silhouette enlargement and previous CABG is worrisome for
low-grade CHF. There is no alveolar pneumonia. Followup films
following therapy would be of value to reassess the pulmonary
interstitium.

## 2015-11-26 IMAGING — US US EXTREM LOW VENOUS BILAT
1 series · 14 of 24 positions shown · non-contrast
Comparison: None.

CLINICAL DATA: Bilateral lower extremity edema. History of skin
cancer.

EXAM:
BILATERAL LOWER EXTREMITY VENOUS DOPPLER ULTRASOUND
TECHNIQUE: Gray-scale sonography with graded compression, as well as color
Doppler and duplex ultrasound, were performed to evaluate the deep
venous system from the level of the common femoral vein through the
popliteal and proximal calf veins. Spectral Doppler was utilized to
evaluate flow at rest and with distal augmentation maneuvers.

[Series 1: us extrem low venous bilat · 0.09mm/px · 14 of 64 slices shown]
[im 1/64]
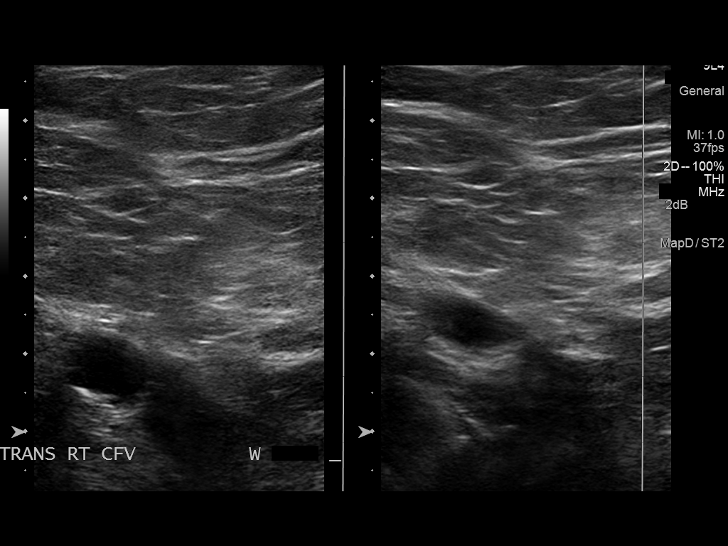
[im 6/64]
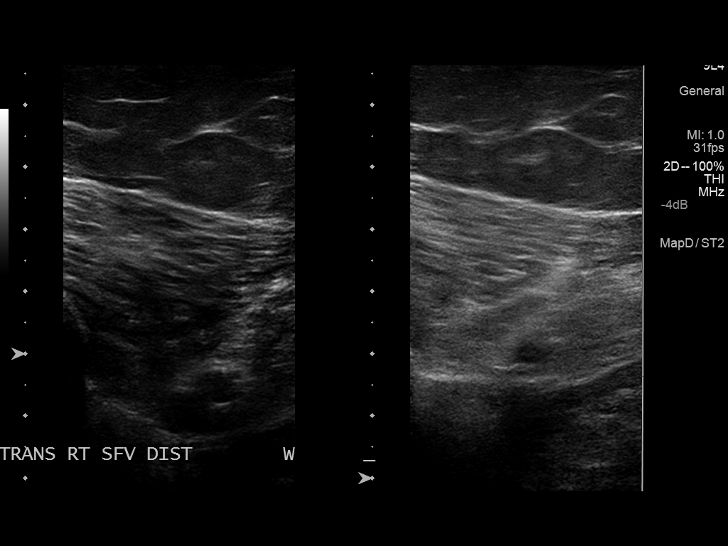
[im 11/64]
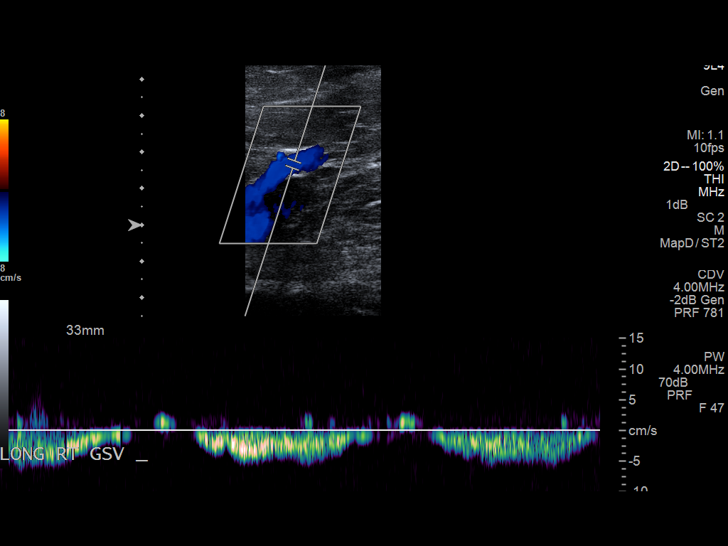
[im 17/64]
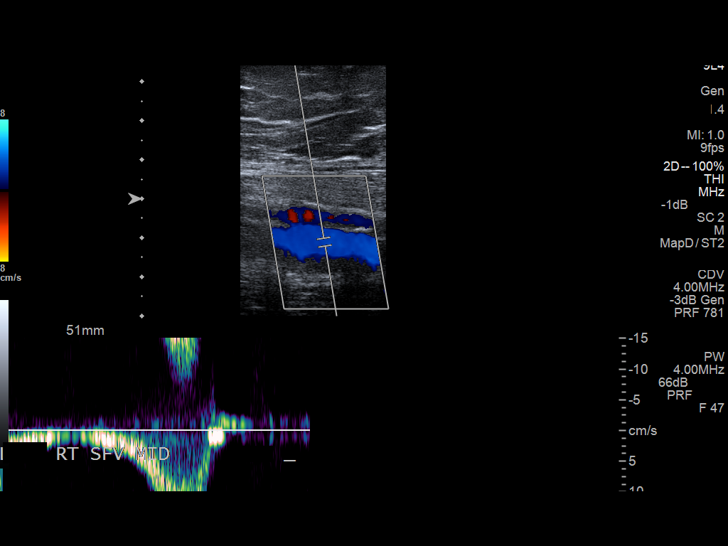
[im 20/64]
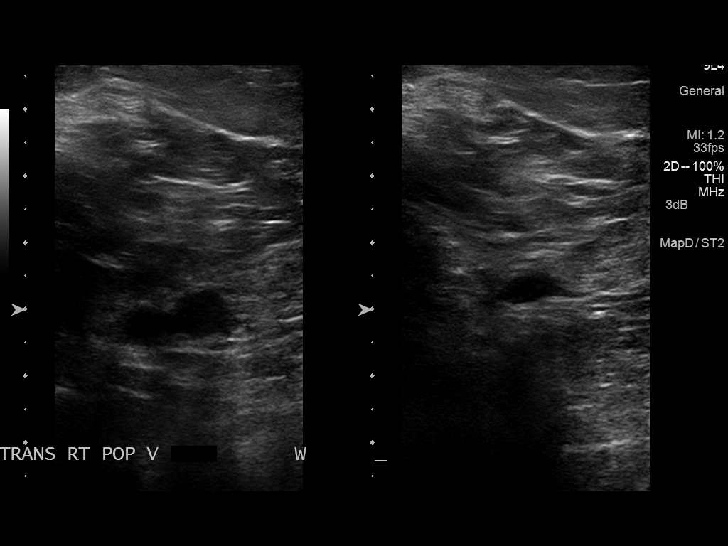
[im 25/64]
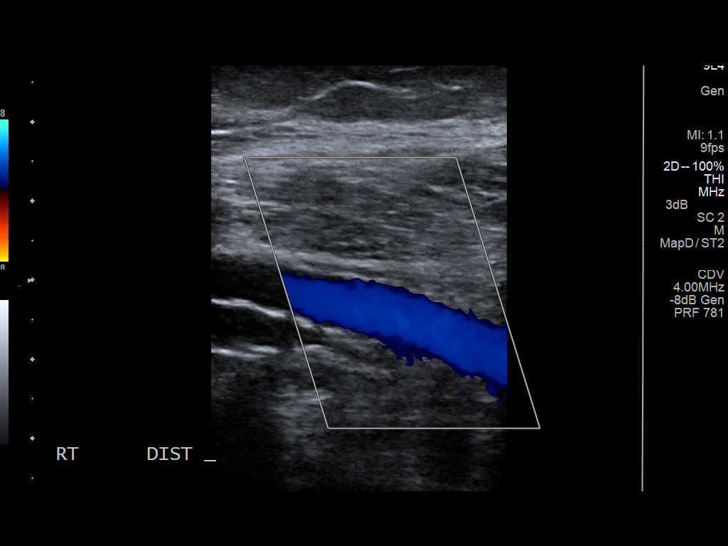
[im 31/64]
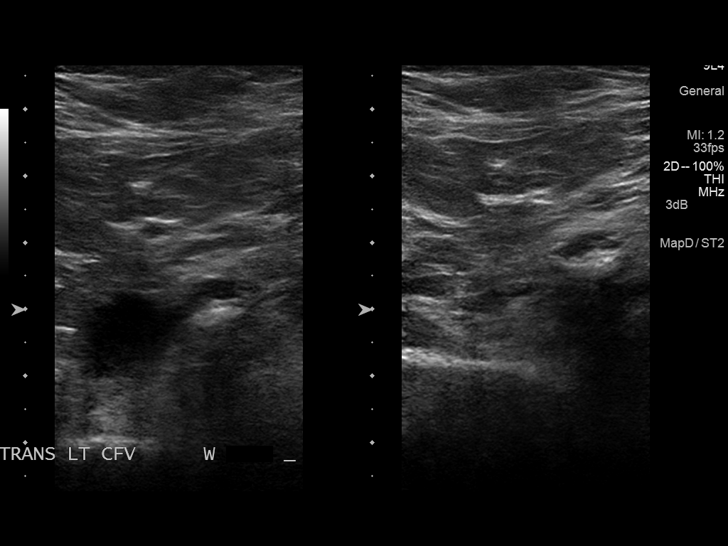
[im 33/64]
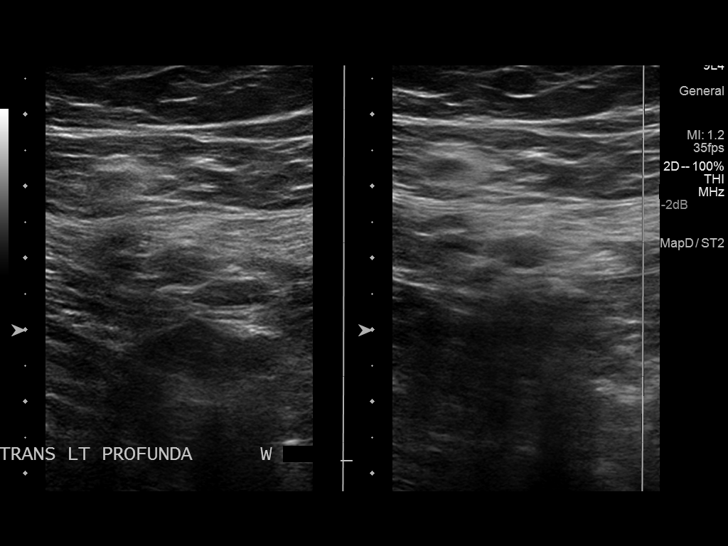
[im 39/64]
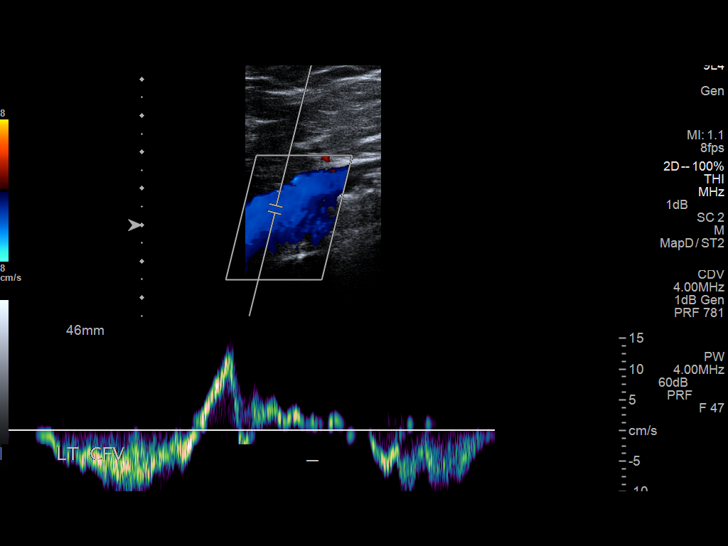
[im 44/64]
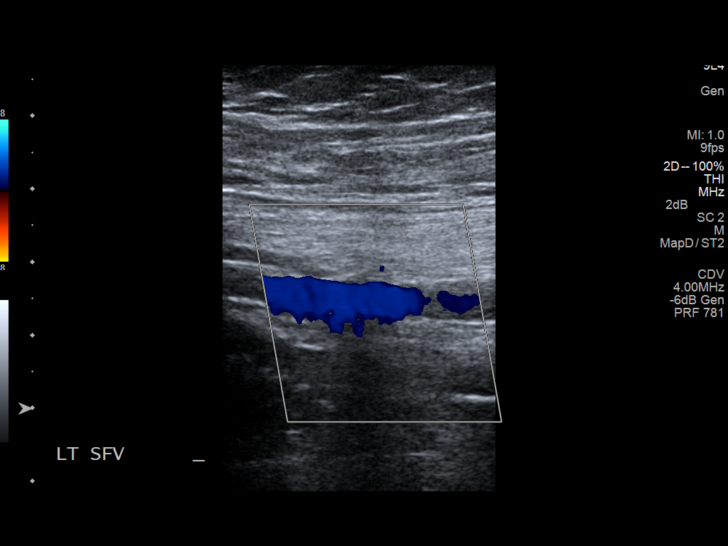
[im 50/64]
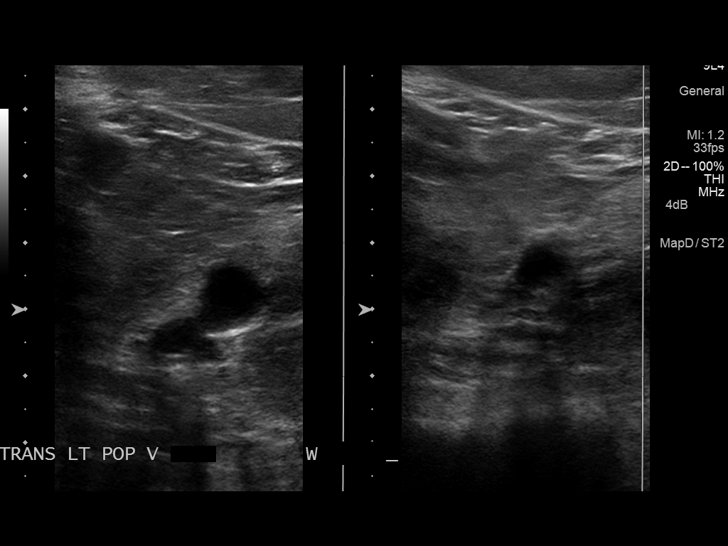
[im 53/64]
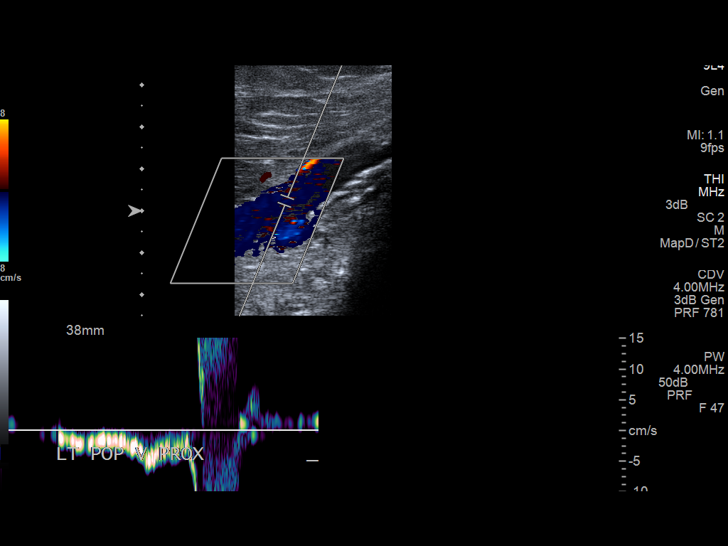
[im 58/64]
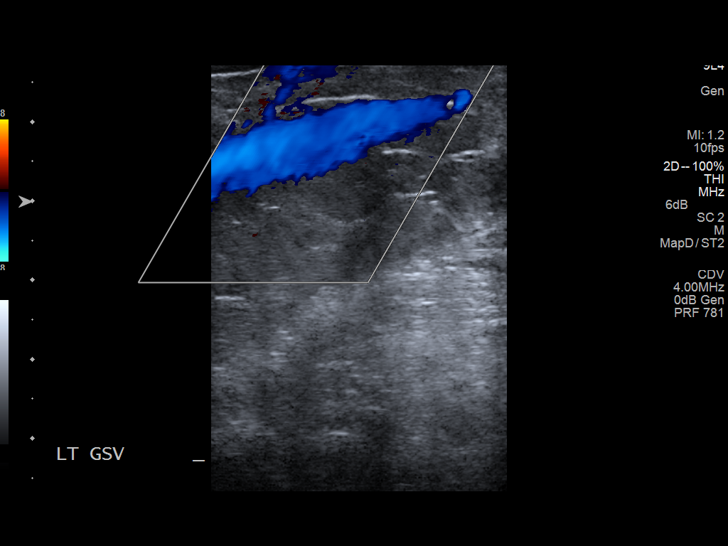
[im 64/64]
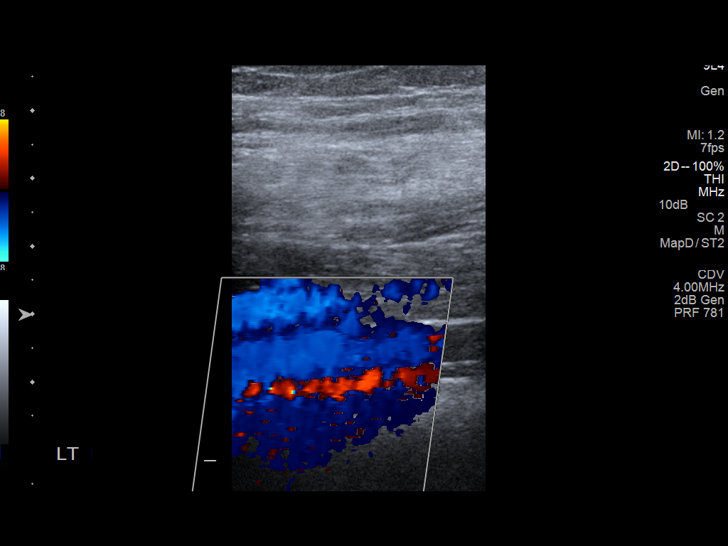

[14 of 24 positions shown; findings below may reference images not displayed]

FINDINGS: Thrombus within deep veins:  None visualized.

Compressibility of deep veins:  Normal.

Duplex waveform respiratory phasicity:  Normal.

Duplex waveform response to augmentation:  Normal.

Venous reflux:  None visualized.

Other findings: No evidence of superficial thrombophlebitis or
abnormal fluid collection.
IMPRESSION: No evidence of DVT in either lower extremity.

## 2018-11-08 DIAGNOSIS — R600 Localized edema: Secondary | ICD-10-CM | POA: Insufficient documentation

## 2019-07-15 LAB — HEMOGLOBIN A1C: Hemoglobin A1C: 7.7

## 2019-07-19 ENCOUNTER — Other Ambulatory Visit: Payer: Self-pay | Admitting: Nephrology

## 2019-07-19 DIAGNOSIS — N179 Acute kidney failure, unspecified: Secondary | ICD-10-CM

## 2019-07-22 ENCOUNTER — Other Ambulatory Visit: Payer: Self-pay

## 2019-07-22 ENCOUNTER — Ambulatory Visit
Admission: RE | Admit: 2019-07-22 | Discharge: 2019-07-22 | Disposition: A | Discharge status: Home / Self Care | Payer: Federal, State, Local not specified - PPO | Source: Ambulatory Visit | Attending: Nephrology | Admitting: Nephrology

## 2019-07-22 DIAGNOSIS — N179 Acute kidney failure, unspecified: Secondary | ICD-10-CM

## 2019-09-13 ENCOUNTER — Other Ambulatory Visit: Payer: Self-pay | Admitting: Family Medicine

## 2019-09-13 ENCOUNTER — Encounter: Payer: Self-pay | Admitting: Family Medicine

## 2019-09-13 ENCOUNTER — Ambulatory Visit (INDEPENDENT_AMBULATORY_CARE_PROVIDER_SITE_OTHER): Payer: Federal, State, Local not specified - PPO | Admitting: Family Medicine

## 2019-09-13 ENCOUNTER — Other Ambulatory Visit: Payer: Self-pay

## 2019-09-13 VITALS — BP 101/46 | HR 79 | Temp 97.5°F | Resp 16 | Ht 66.0 in | Wt 252.0 lb

## 2019-09-13 DIAGNOSIS — I129 Hypertensive chronic kidney disease with stage 1 through stage 4 chronic kidney disease, or unspecified chronic kidney disease: Secondary | ICD-10-CM

## 2019-09-13 DIAGNOSIS — Z23 Encounter for immunization: Secondary | ICD-10-CM

## 2019-09-13 DIAGNOSIS — E1169 Type 2 diabetes mellitus with other specified complication: Secondary | ICD-10-CM

## 2019-09-13 DIAGNOSIS — E785 Hyperlipidemia, unspecified: Secondary | ICD-10-CM

## 2019-09-13 DIAGNOSIS — Z0000 Encounter for general adult medical examination without abnormal findings: Secondary | ICD-10-CM

## 2019-09-13 DIAGNOSIS — K219 Gastro-esophageal reflux disease without esophagitis: Secondary | ICD-10-CM

## 2019-09-13 DIAGNOSIS — E1121 Type 2 diabetes mellitus with diabetic nephropathy: Secondary | ICD-10-CM | POA: Diagnosis not present

## 2019-09-13 DIAGNOSIS — N183 Chronic kidney disease, stage 3 unspecified: Secondary | ICD-10-CM

## 2019-09-13 DIAGNOSIS — M1A0790 Idiopathic chronic gout, unspecified ankle and foot, without tophus (tophi): Secondary | ICD-10-CM

## 2019-09-13 DIAGNOSIS — I25810 Atherosclerosis of coronary artery bypass graft(s) without angina pectoris: Secondary | ICD-10-CM

## 2019-09-13 DIAGNOSIS — Z8719 Personal history of other diseases of the digestive system: Secondary | ICD-10-CM | POA: Diagnosis not present

## 2019-09-13 DIAGNOSIS — Z794 Long term (current) use of insulin: Secondary | ICD-10-CM

## 2019-09-13 DIAGNOSIS — N400 Enlarged prostate without lower urinary tract symptoms: Secondary | ICD-10-CM

## 2019-09-13 DIAGNOSIS — Z7689 Persons encountering health services in other specified circumstances: Secondary | ICD-10-CM

## 2019-09-13 DIAGNOSIS — D333 Benign neoplasm of cranial nerves: Secondary | ICD-10-CM

## 2019-09-13 DIAGNOSIS — M1A9XX0 Chronic gout, unspecified, without tophus (tophi): Secondary | ICD-10-CM | POA: Insufficient documentation

## 2019-09-13 DIAGNOSIS — Z6841 Body Mass Index (BMI) 40.0 and over, adult: Secondary | ICD-10-CM

## 2019-09-13 MED ORDER — ALLOPURINOL 100 MG PO TABS: 100.0000 mg | ORAL_TABLET | Freq: Every day | ORAL | 3 refills | Status: DC

## 2019-09-13 MED ORDER — ATORVASTATIN CALCIUM 20 MG PO TABS: 20.0000 mg | ORAL_TABLET | Freq: Every day | ORAL | 3 refills | Status: DC

## 2019-09-13 MED ORDER — ESOMEPRAZOLE MAGNESIUM 20 MG PO CPDR: 20.0000 mg | DELAYED_RELEASE_CAPSULE | Freq: Every day | ORAL | 3 refills | Status: DC

## 2019-09-13 NOTE — Assessment & Plan Note (Signed)
Chronic GERD Controlled on low dose PPI Trial to order rx Esomeprazole 20mg  daily

## 2019-09-13 NOTE — Assessment & Plan Note (Signed)
Past history Avoid GLP1 use

## 2019-09-13 NOTE — Assessment & Plan Note (Signed)
Controlled cholesterol on statin  lifestyle Last lipid panel 2021 CAD, PAD  Plan: 1. Continue current meds - Atorvastatin 20mg  daily, re order 90 day 2. On ASA 325 3 Encourage improved lifestyle - low carb/cholesterol, reduce portion size, continue improving regular exercise 4. Follow-up 4 months lipids

## 2019-09-13 NOTE — Assessment & Plan Note (Addendum)
Followed by [facility] Cardiology Dr Nehemiah Massed S/p CABG x 5 in 2001 Has had CAD of bypass graft Last stress test / ECHO 2020 On med management ASA, Coreg, ARB, Atorvastatin

## 2019-09-13 NOTE — Assessment & Plan Note (Signed)
Abnormal weight, BMI >40 Encourage lifestyle diet exercise

## 2019-09-13 NOTE — Assessment & Plan Note (Signed)
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

## 2019-09-13 NOTE — Assessment & Plan Note (Signed)
Chronic problem Recurrent episodic flares Mostly controlled on Allopurinol 100mg  daily Has colchicine PRN only Caution with CKD  Check Uric Acid in 4 months

## 2019-09-13 NOTE — Assessment & Plan Note (Signed)
Well-controlled HTN Complication with CKDIII, CAD, edema   Plan:  1. Continue current BP regimen Carvedilol 25mg  BID, Telmisartan-HCTZ 80-25mg  daily 2. Encourage improved lifestyle - low sodium diet, regular exercise 3. Continue monitor BP outside office, bring readings to next visit, if persistently >140/90 or new symptoms notify office sooner

## 2019-09-13 NOTE — Progress Notes (Signed)
Subjective:    Patient ID: [name], male    DOB: 02-24-1945, 74 y.o.   MRN: [id number]  [name] is a 74 y.o. male presenting on 09/13/2019 for Establish Care (meds refill--A1c 7.7 07/15/2019) and Diabetes  Previous PCP Dr [facility]). Here to establish care.  HPI    Specialists Nephrology - Dr Murlean Iba ([place]) Endocrinology - Dr Mee Hives [facility]) Cardiology - Dr Serafina Royals [facility] Cardiology)  CHRONIC DM, Type 2: Chronic problem. Previously had some elevated A1c CBG >300-400, now with Endocrinology and has improved, he is on insulin. A1c back to 7 range. Last A1c is on chart, from 07/2019 History of pancreatitis in past, he was not determined to be good candidate for GLP1 therapy, also has history of reduced / fluctuating renal function, not on SGLT2. Meds: Toujeo insulin 70 units daily, Glyburide 5mg  x 2 tab BID, Metformin IR 500mg  BID, Humalog 18 unit w/ meal plus SSI dosing 18-21 units avg, skip humalog if skip meal. Reports  good compliance. Tolerating well w/o side-effects Currently on ARB Has [facility]) 10/18/19 Denies hypoglycemia, polyuria, visual changes, numbness or tingling.  HTN CAD / S/p CABG x5 in 2001  [facility] Cardiology Dr Nehemiah Massed Last pharmacological stress, done 04/2018 showed no ischemia, ECHO had normal LVEF On Carvedilol 25mg  BID, Telmisartan-HCTZ 80-25mg  daily Previously on Furosemide. Now he takes Torsemide 20mg  daily, per Nephrology Admits edema LE  Gout, chronic Episodic flares, foot/ankle Wears compression socks helps gout and swelling Taking Allopurinol 100mg  daily, needs refill He has colchicine PRN only.  GERD Chronic problem On OTC Nexium brand 20mg  daily, off med has breakthrough symptoms.  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 Additionally has reduced  hearing in Left ear.  History of Pancreatitis (hospital at [facility])   Health Maintenance:  Due for Flu Shot, will receive today   Last colonoscopy 03/2009. Reported normal. No documentation on chart. Will discuss at upcoming annual.  Depression screen [facility] 2/9 09/13/2019 01/17/2012  Decreased Interest 0 1  Down, Depressed, Hopeless 0 1  PHQ - 2 Score 0 2  Altered sleeping - 0  Tired, decreased energy - 0  Change in appetite - 0  Feeling bad or failure about yourself  - 0  Trouble concentrating - 1  Moving slowly or fidgety/restless - 0  Suicidal thoughts - 0  PHQ-9 Score - 3   No flowsheet data found.    Past Medical History:  Diagnosis Date  . AN (acoustic neuroma) ([place]) 2012   by MRi, [place]  . CAD (coronary artery disease)    multivessel diffuse disease  . Chronic kidney disease   . Hearing loss in right ear   . History of pancreatitis 1996   idiopathic  . HLD (hyperlipidemia)   . Hoarseness   . HTN (hypertension)    Past Surgical History:  Procedure Laterality Date  . BACK SURGERY    . BASAL CELL CARCINOMA EXCISION  2013   removed from right side of face  . CARPAL TUNNEL RELEASE    . CORONARY ANGIOPLASTY WITH STENT PLACEMENT    . CORONARY ARTERY BYPASS GRAFT  NOV 2007   x5  . cyst removal of right hand    . ruptured disk surgery     neck    Social History   Socioeconomic History  . Marital status: Married    Spouse name:  Not on file  . Number of children: Not on file  . Years of education: Not on file  . Highest education level: Not on file  Occupational History  . Not on file  Tobacco Use  . Smoking status: Never Smoker  . Smokeless tobacco: Never Used  . Tobacco comment: tobacco use- no   Substance and Sexual Activity  . Alcohol use: No  . Drug use: No  . Sexual activity: Not on file  Other Topics Concern  . Not on file  Social History Narrative   Does not regularly exercise. Works part time at [employer] 2-3 hours at night.    Social  Determinants of Health   Financial Resource Strain:   . Difficulty of Paying Living Expenses: Not on file  Food Insecurity:   . Worried About Charity fundraiser in the Last Year: Not on file  . Ran Out of Food in the Last Year: Not on file  Transportation Needs:   . Lack of Transportation (Medical): Not on file  . Lack of Transportation (Non-Medical): Not on file  Physical Activity:   . Days of Exercise per Week: Not on file  . Minutes of Exercise per Session: Not on file  Stress:   . Feeling of Stress : Not on file  Social Connections:   . Frequency of Communication with Friends and Family: Not on file  . Frequency of Social Gatherings with Friends and Family: Not on file  . Attends Religious Services: Not on file  . Active Member of Clubs or Organizations: Not on file  . Attends Archivist Meetings: Not on file  . Marital Status: Not on file  Intimate Partner Violence:   . Fear of Current or Ex-Partner: Not on file  . Emotionally Abused: Not on file  . Physically Abused: Not on file  . Sexually Abused: Not on file   Family History  Problem Relation Age of Onset  . Heart attack Father   . Heart disease Mother   . Hyperlipidemia Mother   . Hypertension Mother   . Heart failure Mother   . Coronary artery disease Brother   . Crohn's disease Brother   . Coronary artery disease Other   . Diabetes Other   . Cancer Other   . Colon cancer Sister 83   Current Outpatient Medications on File Prior to Visit  Medication Sig  . aspirin 325 MG tablet Take 650 mg by mouth daily.  . BD PEN NEEDLE NANO 2ND GEN 32G X 4 MM MISC   . carvedilol (COREG) 25 MG tablet Take 1 tablet (25 mg total) by mouth 2 (two) times daily with a meal.  . colchicine 0.6 MG tablet Take 2 tablets (1.2mg ) by mouth at first sign of gout flare followed by 1 tablet (0.6mg ) after 1 hour. (Max 1.8mg  within 1 hour)  . glyBURIDE (DIABETA) 5 MG tablet Take 5 mg by mouth 2 (two) times daily with a meal.  .  hydrocortisone (ANUCORT-HC) 25 MG suppository Place 25 mg rectally as needed.  Marland Kitchen ibuprofen (ADVIL,MOTRIN) 200 MG tablet Take 200 mg by mouth every 6 (six) hours as needed.  . insulin glargine, 1 Unit Dial, (TOUJEO SOLOSTAR) 300 UNIT/ML Solostar Pen Inject into the skin.  Marland Kitchen insulin lispro (HUMALOG KWIKPEN) 100 UNIT/ML KwikPen See admin instructions.  . Insulin Pen Needle (BD PEN NEEDLE NANO 2ND GEN) 32G X 4 MM MISC 4 shots per day  . Lancets (ACCU-CHEK SOFT TOUCH) lancets Use as instructed  .  meclizine (ANTIVERT) 25 MG tablet Take 1 tablet (25 mg total) by mouth 3 (three) times daily as needed.  . metFORMIN (GLUCOPHAGE) 500 MG tablet Take 500 mg by mouth in the morning and at bedtime.  . Multiple Vitamin (MULTI-VITAMIN) tablet Take 1 tablet by mouth daily.  . tamsulosin (FLOMAX) 0.4 MG CAPS capsule Take 0.4 mg by mouth daily.  Marland Kitchen telmisartan-hydrochlorothiazide (MICARDIS HCT) 80-25 MG tablet Take by mouth.  . Testosterone (ANDROGEL) 20.25 MG/1.25GM (1.62%) GEL Place onto the skin daily. Two pumps daily.  Marland Kitchen torsemide (DEMADEX) 20 MG tablet Take 20 mg by mouth daily.   No current facility-administered medications on file prior to visit.    Review of Systems Per HPI unless specifically indicated above     Objective:    BP (!) 101/46   Pulse 79   Temp (!) 97.5 F (36.4 C) (Temporal)   Resp 16   Ht 5\' 6"  (1.676 m)   Wt 252 lb (114.3 kg)   SpO2 99%   BMI 40.67 kg/m   Wt Readings from Last 3 Encounters:  09/13/19 252 lb (114.3 kg)  05/07/14 259 lb 12 oz (117.8 kg)  10/30/13 255 lb 8 oz (115.9 kg)    Physical Exam Vitals and nursing note reviewed.  Constitutional:      General: He is not in acute distress.    Appearance: He is well-developed. He is obese. He is not diaphoretic.     Comments: Well-appearing, comfortable, cooperative  HENT:     Head: Normocephalic and atraumatic.  Eyes:     General:        Right eye: No discharge.        Left eye: No discharge.      Conjunctiva/sclera: Conjunctivae normal.  Cardiovascular:     Rate and Rhythm: Normal rate.  Pulmonary:     Effort: Pulmonary effort is normal.  Musculoskeletal:     Right lower leg: Edema (+2 pitting edema lower leg, has compression socks) present.     Left lower leg: Edema (+2 pitting edema lower leg, has compression socks) present.  Skin:    General: Skin is warm and dry.     Findings: No erythema or rash.  Neurological:     Mental Status: He is alert and oriented to person, place, and time.  Psychiatric:        Behavior: Behavior normal.     Comments: Well groomed, good eye contact, normal speech and thoughts      Diabetic Foot Exam - Simple   Simple Foot Form Diabetic Foot exam was performed with the following findings: Yes 09/13/2019 11:02 AM  Visual Inspection See comments: Yes Sensation Testing Intact to touch and monofilament testing bilaterally: Yes Pulse Check Posterior Tibialis and Dorsalis pulse intact bilaterally: Yes Comments Bilateral mild edema, pitting. No callus or ulceration          Assessment & Plan:   Problem List Items Addressed This Visit    Type 2 diabetes mellitus with diabetic nephropathy ([place]) - Primary    Well-controlled DM with A1c 7.7 (07/2019, improved control on insulin regimen per Endocrinology) Complications - CKD III, Hyperlipidemia, GERD, obesity, CAD -  increases risk of future cardiovascular complications   Plan:  1. Continue current therapy -  Toujeo insulin 70 units daily, Glyburide 5mg  x 2 tab BID, Metformin IR 500mg  BID, Humalog 18 unit w/ meal plus SSI dosing 18-21 units avg, skip humalog if skip meal.  2. Encourage improved lifestyle - low carb, low  sugar diet, reduce portion size, continue improving regular exercise 3. Check CBG , bring log to next visit for review 4. Continue ASA,  ARB, Statin 5. DM Foot exam done today / Advised to schedule DM ophtho exam, send record - [place] 10/2019      Relevant Medications    insulin glargine, 1 Unit Dial, (TOUJEO SOLOSTAR) 300 UNIT/ML Solostar Pen   insulin lispro (HUMALOG KWIKPEN) 100 UNIT/ML KwikPen   metFORMIN (GLUCOPHAGE) 500 MG tablet   telmisartan-hydrochlorothiazide (MICARDIS HCT) 80-25 MG tablet   atorvastatin (LIPITOR) 20 MG tablet   Morbid obesity with BMI of 40.0-44.9, adult (HCC)    Abnormal weight, BMI >40 Encourage lifestyle diet exercise      Relevant Medications   insulin glargine, 1 Unit Dial, (TOUJEO SOLOSTAR) 300 UNIT/ML Solostar Pen   insulin lispro (HUMALOG KWIKPEN) 100 UNIT/ML KwikPen   metFORMIN (GLUCOPHAGE) 500 MG tablet   Hyperlipidemia associated with type 2 diabetes mellitus (HCC)    Controlled cholesterol on statin  lifestyle Last lipid panel 2021 CAD, PAD  Plan: 1. Continue current meds - Atorvastatin 20mg  daily, re order 90 day 2. On ASA 325 3 Encourage improved lifestyle - low carb/cholesterol, reduce portion size, continue improving regular exercise 4. Follow-up 4 months lipids       Relevant Medications   insulin glargine, 1 Unit Dial, (TOUJEO SOLOSTAR) 300 UNIT/ML Solostar Pen   insulin lispro (HUMALOG KWIKPEN) 100 UNIT/ML KwikPen   metFORMIN (GLUCOPHAGE) 500 MG tablet   telmisartan-hydrochlorothiazide (MICARDIS HCT) 80-25 MG tablet   atorvastatin (LIPITOR) 20 MG tablet   History of pancreatitis    Past history Avoid GLP1 use      Gout, chronic    Chronic problem Recurrent episodic flares Mostly controlled on Allopurinol 100mg  daily Has colchicine PRN only Caution with CKD  Check Uric Acid in 4 months      Relevant Medications   allopurinol (ZYLOPRIM) 100 MG tablet   Esophageal reflux    Chronic GERD Controlled on low dose PPI Trial to order rx Esomeprazole 20mg  daily      Relevant Medications   esomeprazole (NEXIUM) 20 MG capsule   CAD, ARTERY BYPASS GRAFT    Followed by [facility] Cardiology Dr Nehemiah Massed S/p CABG x 5 in 2001 Has had CAD of bypass graft Last stress test / ECHO 2020 On med management  ASA, Coreg, ARB, Atorvastatin      Relevant Medications   telmisartan-hydrochlorothiazide (MICARDIS HCT) 80-25 MG tablet   torsemide (DEMADEX) 20 MG tablet   atorvastatin (LIPITOR) 20 MG tablet   carvedilol (COREG) 25 MG tablet   Benign hypertension with CKD (chronic kidney disease) stage III    Well-controlled HTN Complication with CKDIII, CAD, edema   Plan:  1. Continue current BP regimen Carvedilol 25mg  BID, Telmisartan-HCTZ 80-25mg  daily 2. Encourage improved lifestyle - low sodium diet, regular exercise 3. Continue monitor BP outside office, bring readings to next visit, if persistently >140/90 or new symptoms notify office sooner      Relevant Medications   telmisartan-hydrochlorothiazide (MICARDIS HCT) 80-25 MG tablet   torsemide (DEMADEX) 20 MG tablet   atorvastatin (LIPITOR) 20 MG tablet   carvedilol (COREG) 25 MG tablet   Acoustic neuroma [facility])    Previously managed by [facility] Surgically treated in 2015 Chronic loss of hearing R ear. Has reduced hearing L ear       Other Visit Diagnoses    Encounter to establish care with new doctor       Needs flu shot  Relevant Orders   Flu Vaccine QUAD High Dose(Fluad) (Completed)      Review chart outside prior PCP and specialists.  Meds ordered this encounter  Medications  . allopurinol (ZYLOPRIM) 100 MG tablet    Sig: Take 1 tablet (100 mg total) by mouth daily.    Dispense:  90 tablet    Refill:  3    Patient has new PCP Dr Parks Ranger. Please discontinue previous rx on file for 30 day supply with refills. Patient requests 90 day supply.  Marland Kitchen atorvastatin (LIPITOR) 20 MG tablet    Sig: Take 1 tablet (20 mg total) by mouth at bedtime.    Dispense:  90 tablet    Refill:  3    Patient has new PCP Dr Parks Ranger. Please discontinue previous rx on file for 30 day supply with refills. Patient requests 90 day supply.  Marland Kitchen esomeprazole (NEXIUM) 20 MG capsule    Sig: Take 1 capsule (20 mg total) by mouth daily  before breakfast.    Dispense:  90 capsule    Refill:  3     Follow up plan: Return in about 4 months (around 01/13/2020) for 4 month fasting lab only then 1 week later Annual Physical.  Future labs ordered for 01/08/20 (not adding CBC, A1c since monitored by specialists)  Nobie Putnam, [facility] 09/13/2019, 10:39 AM

## 2019-09-13 NOTE — Assessment & Plan Note (Signed)
Well-controlled DM with A1c 7.7 (07/2019, improved control on insulin regimen per Endocrinology) Complications - CKD III, Hyperlipidemia, GERD, obesity, CAD -  increases risk of future cardiovascular complications   Plan:  1. Continue current therapy -  Toujeo insulin 70 units daily, Glyburide 5mg  x 2 tab BID, Metformin IR 500mg  BID, Humalog 18 unit w/ meal plus SSI dosing 18-21 units avg, skip humalog if skip meal.  2. Encourage improved lifestyle - low carb, low sugar diet, reduce portion size, continue improving regular exercise 3. Check CBG , bring log to next visit for review 4. Continue ASA,  ARB, Statin 5. DM Foot exam done today / Advised to schedule DM ophtho exam, send record - [place] 10/2019

## 2019-09-13 NOTE — Patient Instructions (Addendum)
Thank you for coming to the office today.  Refilled Allopurinol and Atorvastatin - both 90 days - call your [facility] to double check that they removed the previous doctor's orders and they can send you the 90 day.  High Dose Flu shot today  Tried to order Nexium generic esomeprazole to mail order 90 day  DUE for FASTING BLOOD WORK (no food or drink after midnight before the lab appointment, only water or coffee without cream/sugar on the morning of)  SCHEDULE "Lab Only" visit in the morning at the clinic for lab draw in 4 MONTHS   - Make sure Lab Only appointment is at about 1 week before your next appointment, so that results will be available  For Lab Results, once available within 2-3 days of blood draw, you can can log in to MyChart online to view your results and a brief explanation. Also, we can discuss results at next follow-up visit.    Please schedule a Follow-up Appointment to: Return in about 4 months (around 01/13/2020) for 4 month fasting lab only then 1 week later Annual Physical.  If you have any other questions or concerns, please feel free to call the office or send a message through [place]. You may also schedule an earlier appointment if necessary.  Additionally, you may be receiving a survey about your experience at our office within a few days to 1 week by e-mail or mail. We value your feedback.  Martin Putnam, DO [place]

## 2019-09-23 ENCOUNTER — Other Ambulatory Visit: Payer: Self-pay

## 2019-09-23 ENCOUNTER — Ambulatory Visit: Payer: Federal, State, Local not specified - PPO | Admitting: Dermatology

## 2019-09-23 DIAGNOSIS — L814 Other melanin hyperpigmentation: Secondary | ICD-10-CM

## 2019-09-23 DIAGNOSIS — D229 Melanocytic nevi, unspecified: Secondary | ICD-10-CM

## 2019-09-23 DIAGNOSIS — Z872 Personal history of diseases of the skin and subcutaneous tissue: Secondary | ICD-10-CM

## 2019-09-23 DIAGNOSIS — Z1283 Encounter for screening for malignant neoplasm of skin: Secondary | ICD-10-CM | POA: Diagnosis not present

## 2019-09-23 DIAGNOSIS — L821 Other seborrheic keratosis: Secondary | ICD-10-CM

## 2019-09-23 DIAGNOSIS — L820 Inflamed seborrheic keratosis: Secondary | ICD-10-CM | POA: Diagnosis not present

## 2019-09-23 DIAGNOSIS — D1800 Hemangioma unspecified site: Secondary | ICD-10-CM

## 2019-09-23 DIAGNOSIS — L219 Seborrheic dermatitis, unspecified: Secondary | ICD-10-CM | POA: Diagnosis not present

## 2019-09-23 DIAGNOSIS — L578 Other skin changes due to chronic exposure to nonionizing radiation: Secondary | ICD-10-CM

## 2019-09-23 MED ORDER — KETOCONAZOLE 2 % EX CREA: TOPICAL_CREAM | CUTANEOUS | 6 refills | Status: DC

## 2019-09-23 NOTE — Progress Notes (Signed)
   Follow-Up Visit   Subjective  [name] is a 74 y.o. male who presents for the following: Annual Exam (Yearly TBSe, hx of Aks, ) and Skin Problem (check a growth on R lower leg ). Rash on the nose and chin pink, scaly areas. The patient presents for Total-Body Skin Exam (TBSE) for skin cancer screening and mole check.  The following portions of the chart were reviewed this encounter and updated as appropriate:  Tobacco  Allergies  Meds  Problems  Med Hx  Surg Hx  Fam Hx     Review of Systems:  No other skin or systemic complaints except as noted in HPI or Assessment and Plan.  Objective  Well appearing patient in no apparent distress; mood and affect are within normal limits.  A full examination was performed including scalp, head, eyes, ears, nose, lips, neck, chest, axillae, abdomen, back, buttocks, bilateral upper extremities, bilateral lower extremities, hands, feet, fingers, toes, fingernails, and toenails. All findings within normal limits unless otherwise noted below.  Objective  Right leg pretibial, L side, L side burn, R temple (5): Erythematous keratotic or waxy stuck-on papule or plaque.   Objective  chin: Pink patches with greasy scale.    Assessment & Plan  Inflamed seborrheic keratosis (5) Right leg pretibial, L side, L side burn, R temple  Destruction of lesion - Right leg pretibial, L side, L side burn, R temple Complexity: simple   Destruction method: cryotherapy   Informed consent: discussed and consent obtained   Timeout:  patient name, date of birth, surgical site, and procedure verified Lesion destroyed using liquid nitrogen: Yes   Region frozen until ice ball extended beyond lesion: Yes   Outcome: patient tolerated procedure well with no complications   Post-procedure details: wound care instructions given    Seborrheic dermatitis Face;chin Start Ketoconazole 2% cream apply to around nose and chin qhs M/W/F Ordered  Medications: ketoconazole (NIZORAL) 2 % cream   Lentigines - Scattered tan macules - Discussed due to sun exposure - Benign, observe - Call for any changes  Seborrheic Keratoses - Stuck-on, waxy, tan-brown papules and plaques  - Discussed benign etiology and prognosis. - Observe - Call for any changes  Melanocytic Nevi - Tan-brown and/or pink-flesh-colored symmetric macules and papules - Benign appearing on exam today - Observation - Call clinic for new or changing moles - Recommend daily use of broad spectrum spf 30+ sunscreen to sun-exposed areas.   Hemangiomas - Red papules - Discussed benign nature - Observe - Call for any changes  Actinic Damage - diffuse scaly erythematous macules with underlying dyspigmentation - Recommend daily broad spectrum sunscreen SPF 30+ to sun-exposed areas, reapply every 2 hours as needed.  - Call for new or changing lesions.  Skin cancer screening performed today.  Return in about 1 year (around 09/22/2020), or TBSE.  IMarye Round, CMA, am acting as scribe for [name], MD .  Documentation: I have reviewed the above documentation for accuracy and completeness, and I agree with the above.  [name], MD

## 2019-09-25 ENCOUNTER — Encounter: Payer: Self-pay | Admitting: Dermatology

## 2019-10-01 ENCOUNTER — Other Ambulatory Visit: Payer: Self-pay

## 2019-10-01 ENCOUNTER — Ambulatory Visit: Payer: Federal, State, Local not specified - PPO | Admitting: Podiatry

## 2019-10-01 DIAGNOSIS — E0843 Diabetes mellitus due to underlying condition with diabetic autonomic (poly)neuropathy: Secondary | ICD-10-CM | POA: Diagnosis not present

## 2019-10-01 DIAGNOSIS — S91201A Unspecified open wound of right great toe with damage to nail, initial encounter: Secondary | ICD-10-CM | POA: Diagnosis not present

## 2019-10-01 DIAGNOSIS — L603 Nail dystrophy: Secondary | ICD-10-CM

## 2019-10-01 NOTE — Progress Notes (Signed)
   SUBJECTIVE Patient with a history of diabetes mellitus presents to office today complaining of elongated, thickened nails that cause pain while ambulating in shoes. He is unable to trim his own nails. Patient also complains of a loose and partially detached nail to the right hallux nail plate. He denies trauma or injury to the area. He states that his wife noticed the nail plate was loose. Patient is here for further evaluation and treatment.   Past Medical History:  Diagnosis Date  . Actinic keratosis   . 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 CAD (coronary artery disease)    multivessel diffuse disease  . Chronic kidney disease   . Hearing loss in right ear   . History of pancreatitis 1996   idiopathic  . HLD (hyperlipidemia)   . Hoarseness   . HTN (hypertension)     OBJECTIVE General Patient is awake, alert, and oriented x 3 and in no acute distress. Derm Skin is dry and supple bilateral. Negative open lesions or macerations. Remaining integument unremarkable. Nails are tender, long, thickened and dystrophic with subungual debris, consistent with onychomycosis, 1-5 bilateral. No signs of infection noted. Partially detached loosely adhered nail plate noted to the right hallux nail plate. Vasc  DP and PT pedal pulses palpable bilaterally. Temperature gradient within normal limits.  Neuro Epicritic and protective threshold sensation diminished bilaterally.  Musculoskeletal Exam No symptomatic pedal deformities noted bilateral. Muscular strength within normal limits.  ASSESSMENT 1. Diabetes Mellitus w/ peripheral neuropathy 2. Onychomycosis of nail due to dermatophyte bilateral 3. Loosely adhered partially detached right hallux nail plate  PLAN OF CARE 1. Patient evaluated today. 2. Instructed to maintain good pedal hygiene and foot care. Stressed importance of controlling blood sugar.  3. Mechanical debridement of nails 1-5 bilaterally performed using a nail  nipper. Filed with dremel without incident.  4. Explained to the patient that it would be in his best health to have the right hallux nail plate completely removed. The patient agrees. Total temporary nail avulsion was performed to the right hallux nail plate. Prior to procedure the toe was blocked in a hallux block fashion using 3 mL of 2% lidocaine plain. Toe was prepped in aseptic manner and avulsion was performed. Light dressing applied.  5. Post care instructions were provided.  6. Return to clinic in 3 mos. for routine foot care    Edrick Kins, DPM [facility]  Dr. Edrick Kins, [address]                                        [place], [place] [address]                Office [phone]  Fax [phone]

## 2019-10-21 LAB — HM DIABETES EYE EXAM

## 2019-11-18 LAB — HEMOGLOBIN A1C: Hemoglobin A1C: 7.2

## 2019-12-31 ENCOUNTER — Telehealth: Payer: Self-pay

## 2019-12-31 NOTE — Telephone Encounter (Signed)
Copied from CRM [id number]. Topic: General - Inquiry >> Dec 30, 2019 11:12 AM [name] wrote: Reason for CRM: Patient needs a prior approval on esomeprazole as soon as possible. He stated that the pharmacy told him he only had 15 days to get it approved and he doesn't want to run out of the medication. He can be reached at [phone]. Please advise

## 2019-12-31 NOTE — Telephone Encounter (Signed)
Not receive any PA request from the pharmacy advised to buy from OTC and he states he has apt coming in 01/13/2020 will re prescribe by Dr Kirtland Bouchard so can initiate his PA request.

## 2020-01-08 ENCOUNTER — Other Ambulatory Visit: Payer: Federal, State, Local not specified - PPO

## 2020-01-08 ENCOUNTER — Other Ambulatory Visit: Payer: Self-pay

## 2020-01-08 DIAGNOSIS — Z6841 Body Mass Index (BMI) 40.0 and over, adult: Secondary | ICD-10-CM

## 2020-01-08 DIAGNOSIS — Z0000 Encounter for general adult medical examination without abnormal findings: Secondary | ICD-10-CM

## 2020-01-08 DIAGNOSIS — E1121 Type 2 diabetes mellitus with diabetic nephropathy: Secondary | ICD-10-CM

## 2020-01-08 DIAGNOSIS — M1A0790 Idiopathic chronic gout, unspecified ankle and foot, without tophus (tophi): Secondary | ICD-10-CM

## 2020-01-08 DIAGNOSIS — E1169 Type 2 diabetes mellitus with other specified complication: Secondary | ICD-10-CM

## 2020-01-08 DIAGNOSIS — Z794 Long term (current) use of insulin: Secondary | ICD-10-CM

## 2020-01-08 DIAGNOSIS — E785 Hyperlipidemia, unspecified: Secondary | ICD-10-CM

## 2020-01-08 DIAGNOSIS — N400 Enlarged prostate without lower urinary tract symptoms: Secondary | ICD-10-CM

## 2020-01-09 LAB — COMPLETE METABOLIC PANEL WITH GFR
AG Ratio: 1.8 (calc) (ref 1.0–2.5)
ALT: 19 U/L (ref 9–46)
AST: 16 U/L (ref 10–35)
Albumin: 4.1 g/dL (ref 3.6–5.1)
Alkaline phosphatase (APISO): 97 U/L (ref 35–144)
BUN/Creatinine Ratio: 20 (calc) (ref 6–22)
BUN: 46 mg/dL — ABNORMAL HIGH (ref 7–25)
CO2: 27 mmol/L (ref 20–32)
Calcium: 9.3 mg/dL (ref 8.6–10.3)
Chloride: 101 mmol/L (ref 98–110)
Creat: 2.33 mg/dL — ABNORMAL HIGH (ref 0.70–1.18)
GFR, Est African American: 31 mL/min/{1.73_m2} — ABNORMAL LOW (ref >=60)
GFR, Est Non African American: 26 mL/min/{1.73_m2} — ABNORMAL LOW (ref >=60)
Globulin: 2.3 g/dL (calc) (ref 1.9–3.7)
Glucose, Bld: 188 mg/dL — ABNORMAL HIGH (ref 65–99)
Potassium: 4.5 mmol/L (ref 3.5–5.3)
Sodium: 141 mmol/L (ref 135–146)
Total Bilirubin: 0.3 mg/dL (ref 0.2–1.2)
Total Protein: 6.4 g/dL (ref 6.1–8.1)

## 2020-01-09 LAB — LIPID PANEL
Cholesterol: 137 mg/dL (ref <200)
HDL: 31 mg/dL — ABNORMAL LOW (ref >=40)
Non-HDL Cholesterol (Calc): 106 mg/dL (calc) (ref <130)
Total CHOL/HDL Ratio: 4.4 (calc) (ref <5.0)
Triglycerides: 428 mg/dL — ABNORMAL HIGH (ref <150)

## 2020-01-09 LAB — URIC ACID: Uric Acid, Serum: 7.8 mg/dL (ref 4.0–8.0)

## 2020-01-09 LAB — PSA: PSA: 0.89 ng/mL (ref <=4.0)

## 2020-01-09 LAB — TSH: TSH: 1.35 mIU/L (ref 0.40–4.50)

## 2020-01-13 ENCOUNTER — Other Ambulatory Visit: Payer: Self-pay

## 2020-01-13 ENCOUNTER — Ambulatory Visit (INDEPENDENT_AMBULATORY_CARE_PROVIDER_SITE_OTHER): Payer: Federal, State, Local not specified - PPO | Admitting: Family Medicine

## 2020-01-13 ENCOUNTER — Encounter: Payer: Self-pay | Admitting: Family Medicine

## 2020-01-13 VITALS — BP 118/54 | HR 89 | Temp 97.1°F | Resp 16 | Ht 67.0 in | Wt 254.0 lb

## 2020-01-13 DIAGNOSIS — E1169 Type 2 diabetes mellitus with other specified complication: Secondary | ICD-10-CM

## 2020-01-13 DIAGNOSIS — Z794 Long term (current) use of insulin: Secondary | ICD-10-CM

## 2020-01-13 DIAGNOSIS — N400 Enlarged prostate without lower urinary tract symptoms: Secondary | ICD-10-CM

## 2020-01-13 DIAGNOSIS — Z0000 Encounter for general adult medical examination without abnormal findings: Secondary | ICD-10-CM

## 2020-01-13 DIAGNOSIS — E1121 Type 2 diabetes mellitus with diabetic nephropathy: Secondary | ICD-10-CM | POA: Diagnosis not present

## 2020-01-13 DIAGNOSIS — M1A0790 Idiopathic chronic gout, unspecified ankle and foot, without tophus (tophi): Secondary | ICD-10-CM

## 2020-01-13 DIAGNOSIS — N183 Chronic kidney disease, stage 3 unspecified: Secondary | ICD-10-CM

## 2020-01-13 DIAGNOSIS — E785 Hyperlipidemia, unspecified: Secondary | ICD-10-CM

## 2020-01-13 DIAGNOSIS — Z6841 Body Mass Index (BMI) 40.0 and over, adult: Secondary | ICD-10-CM

## 2020-01-13 DIAGNOSIS — I129 Hypertensive chronic kidney disease with stage 1 through stage 4 chronic kidney disease, or unspecified chronic kidney disease: Secondary | ICD-10-CM

## 2020-01-13 DIAGNOSIS — K219 Gastro-esophageal reflux disease without esophagitis: Secondary | ICD-10-CM

## 2020-01-13 MED ORDER — CARVEDILOL 25 MG PO TABS: 25.0000 mg | ORAL_TABLET | Freq: Two times a day (BID) | ORAL | 3 refills | Status: DC

## 2020-01-13 MED ORDER — ESOMEPRAZOLE MAGNESIUM 20 MG PO CPDR: 20.0000 mg | DELAYED_RELEASE_CAPSULE | Freq: Every day | ORAL | 3 refills | Status: DC

## 2020-01-13 MED ORDER — GLYBURIDE 5 MG PO TABS: 10.0000 mg | ORAL_TABLET | Freq: Two times a day (BID) | ORAL | 3 refills | Status: DC

## 2020-01-13 NOTE — Patient Instructions (Addendum)
Thank you for coming to the office today.  Elevated Creatinine 2.3, please review with Dr Candiss Norse  Elevated Triglycerides - may try Fish Oil omega 3, can take 1,000 mg twice a day with meal.  Refilled the Glyburide, Carvedilol, Esomeprazole  Keep up with your current specialists.   Please schedule a Follow-up Appointment to: Return in about 6 months (around 07/12/2020) for 6 month DM A1c, CKD.  If you have any other questions or concerns, please feel free to call the office or send a message through [place]. You may also schedule an earlier appointment if necessary.  Additionally, you may be receiving a survey about your experience at our office within a few days to 1 week by e-mail or mail. We value your feedback.  Nobie Putnam, DO [place]

## 2020-01-13 NOTE — Assessment & Plan Note (Signed)
Well-controlled DM with A1c 7.2 (11/2019), improved control on insulin regimen per Endocrinology) Complications - CKD III, Hyperlipidemia, GERD, obesity, CAD -  increases risk of future cardiovascular complications   Plan:  1. Continue current therapy -  Toujeo insulin 70 units daily, Glyburide 5mg  x 2 tab BID, Metformin IR 500mg  BID, Humalog 18 unit w/ meal plus SSI dosing 18-21 units avg, skip humalog if skip meal.  2. Encourage improved lifestyle - low carb, low sugar diet, reduce portion size, continue improving regular exercise 3. Check CBG , bring log to next visit for review 4. Continue ASA,  ARB, Statin

## 2020-01-13 NOTE — Assessment & Plan Note (Signed)
Well-controlled HTN Complication with CKDIII, CAD, edema   Plan:  1. Continue current BP regimen Carvedilol 25mg BID, Telmisartan-HCTZ 80-25mg daily 2. Encourage improved lifestyle - low sodium diet, regular exercise 3. Continue monitor BP outside office, bring readings to next visit, if persistently >140/90 or new symptoms notify office sooner 

## 2020-01-13 NOTE — Assessment & Plan Note (Signed)
Controlled cholesterol on statin  lifestyle Last lipid panel 01/2019 CAD, PAD  Plan: 1. Continue current meds - Atorvastatin 20mg  daily 2. On ASA 325 3 Encourage improved lifestyle - low carb/cholesterol, reduce portion size, continue improving regular exercise

## 2020-01-13 NOTE — Assessment & Plan Note (Signed)
Chronic problem Recurrent episodic flares - none recently. Uric acid 7.8 Mostly controlled on Allopurinol 100mg  daily Has colchicine PRN only Caution with CKD

## 2020-01-13 NOTE — Progress Notes (Signed)
Subjective:    Patient ID: [name], male    DOB: 1945-03-03, 75 y.o.   MRN: [id number]  [name] is a 75 y.o. male presenting on 01/13/2020 for Annual Exam   HPI   Here for Annual Physical   Specialists Nephrology - Dr Murlean Iba ([place]) Endocrinology - Dr Mee Hives [facility] Endocrinology) Cardiology - Dr Serafina Royals [facility] Cardiology)  CHRONIC DM, Type 2: Chronic problem. Previously had some elevated A1c CBG >300-400, now with Endocrinology and has improved, he is on insulin. A1c back to 7 range. Last A1c from Endocrine 7.2 (11/2019) History of pancreatitis in past, he was not determined to be good candidate for GLP1 therapy, also has history of reduced / fluctuating renal function, not on SGLT2. Meds: Toujeo insulin 70 units daily, Glyburide 5mg  x 2 tab BID, Metformin IR 500mg  BID, Humalog 18 unit w/ meal plus SSI dosing 18-21 units avg, skip humalog if skip meal. Needs refills Reports  good compliance. Tolerating well w/o side-effects Currently on ARB [facility]) 10/18/19 Denies hypoglycemia, polyuria, visual changes, numbness or tingling.  HTN CAD / S/p CABG x5 in 2001  [facility] Cardiology Dr Nehemiah Massed Last pharmacological stress, done 04/2018 showed no ischemia, ECHO had normal LVEF On Carvedilol 25mg  BID, Telmisartan-HCTZ 80-25mg  daily Needs refill Carvedilol Previously on Furosemide. Now he takes Torsemide 20mg  daily, per Nephrology Admits edema LE  Gout, chronic Episodic flares, foot/ankle Wears compression socks helps gout and swelling Last result Uric Acid 7.8 (01/2020) No gout flares recently Taking Allopurinol 100mg  daily, needs refill He has colchicine PRN only.  GERD Chronic problem On OTC Nexium brand 20mg  daily, off med has breakthrough symptoms. Needs new authorization.  Acoustic Neuroma, Right S/p surgical removal. 2015. Complication He has no hearing  R ear. Additionally has reduced hearing in Left ear.  History of Pancreatitis ([facility] at [facility])   Health Maintenance:  Due for Flu Shot, will receive today   Last colonoscopy 03/2009. Now at age 89 he declines further colonoscopy or cologuard. He prefers to not know.  PSA 0.89 (negative, normal 01/2020)  Depression screen [facility] 2/9 01/13/2020 09/13/2019 01/17/2012  Decreased Interest 0 0 1  Down, Depressed, Hopeless 0 0 1  PHQ - 2 Score 0 0 2  Altered sleeping - - 0  Tired, decreased energy - - 0  Change in appetite - - 0  Feeling bad or failure about yourself  - - 0  Trouble concentrating - - 1  Moving slowly or fidgety/restless - - 0  Suicidal thoughts - - 0  PHQ-9 Score - - 3    Past Medical History:  Diagnosis Date  . Actinic keratosis   . 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 CAD (coronary artery disease)    multivessel diffuse disease  . Chronic kidney disease   . Hearing loss in right ear   . History of pancreatitis 1996   idiopathic  . HLD (hyperlipidemia)   . Hoarseness   . HTN (hypertension)    Past Surgical History:  Procedure Laterality Date  . BACK SURGERY    . BASAL CELL CARCINOMA EXCISION  2013   removed from right side of face  . CARPAL TUNNEL RELEASE    . CORONARY ANGIOPLASTY WITH STENT PLACEMENT    . CORONARY ARTERY BYPASS GRAFT  NOV 2007   x5  . cyst removal of right hand    . ruptured  disk surgery     neck    Social History   Socioeconomic History  . Marital status: Married    Spouse name: Not on file  . Number of children: Not on file  . Years of education: Not on file  . Highest education level: Not on file  Occupational History  . Not on file  Tobacco Use  . Smoking status: Never Smoker  . Smokeless tobacco: Never Used  . Tobacco comment: tobacco use- no   Substance and Sexual Activity  . Alcohol use: No  . Drug use: No  . Sexual activity: Not on file  Other Topics Concern  . Not on file  Social History  Narrative   Does not regularly exercise. Works part time at [employer] 2-3 hours at night.    Social Determinants of Health   Financial Resource Strain: Not on file  Food Insecurity: Not on file  Transportation Needs: Not on file  Physical Activity: Not on file  Stress: Not on file  Social Connections: Not on file  Intimate Partner Violence: Not on file   Family History  Problem Relation Age of Onset  . Heart attack Father   . Heart disease Mother   . Hyperlipidemia Mother   . Hypertension Mother   . Heart failure Mother   . Coronary artery disease Brother   . Crohn's disease Brother   . Melanoma Brother   . Coronary artery disease Other   . Diabetes Other   . Cancer Other   . Colon cancer Sister 24   Current Outpatient Medications on File Prior to Visit  Medication Sig  . allopurinol (ZYLOPRIM) 100 MG tablet Take 1 tablet (100 mg total) by mouth daily.  Marland Kitchen aspirin 325 MG tablet Take 650 mg by mouth daily.  Marland Kitchen atorvastatin (LIPITOR) 20 MG tablet Take 1 tablet (20 mg total) by mouth at bedtime.  . colchicine 0.6 MG tablet Take 2 tablets (1.2mg ) by mouth at first sign of gout flare followed by 1 tablet (0.6mg ) after 1 hour. (Max 1.8mg  within 1 hour)  . hydrocortisone (ANUSOL-HC) 25 MG suppository Place 25 mg rectally as needed.  Marland Kitchen ibuprofen (ADVIL,MOTRIN) 200 MG tablet Take 200 mg by mouth every 6 (six) hours as needed.  . insulin glargine, 1 Unit Dial, (TOUJEO SOLOSTAR) 300 UNIT/ML Solostar Pen Inject into the skin.  Marland Kitchen insulin lispro (HUMALOG) 100 UNIT/ML KwikPen See admin instructions.  . Insulin Pen Needle (BD PEN NEEDLE NANO 2ND GEN) 32G X 4 MM MISC 4 shots per day  . ketoconazole (NIZORAL) 2 % cream Apply around nose and chin qhs M/W/F  . Lancets (ACCU-CHEK SOFT TOUCH) lancets Use as instructed  . meclizine (ANTIVERT) 25 MG tablet Take 1 tablet (25 mg total) by mouth 3 (three) times daily as needed.  . metFORMIN (GLUCOPHAGE) 500 MG tablet Take 500 mg by mouth in the  morning and at bedtime.  . Multiple Vitamin (MULTI-VITAMIN) tablet Take 1 tablet by mouth daily.  . tamsulosin (FLOMAX) 0.4 MG CAPS capsule Take 0.4 mg by mouth daily.  Marland Kitchen telmisartan-hydrochlorothiazide (MICARDIS HCT) 80-25 MG tablet Take by mouth.  . torsemide (DEMADEX) 20 MG tablet Take 20 mg by mouth daily.  . BD PEN NEEDLE NANO 2ND GEN 32G X 4 MM MISC   . Testosterone (ANDROGEL) 20.25 MG/1.25GM (1.62%) GEL Place onto the skin daily. Two pumps daily.   No current facility-administered medications on file prior to visit.    Review of Systems  Constitutional: Negative for activity change,  appetite change, chills, diaphoresis, fatigue and fever.  HENT: Negative for congestion and hearing loss.   Eyes: Negative for visual disturbance.  Respiratory: Negative for apnea, cough, chest tightness, shortness of breath and wheezing.   Cardiovascular: Negative for chest pain, palpitations and leg swelling.  Gastrointestinal: Negative for abdominal pain, constipation, diarrhea, nausea and vomiting.  Endocrine: Negative for cold intolerance.  Genitourinary: Negative for difficulty urinating, dysuria, frequency and hematuria.  Musculoskeletal: Negative for arthralgias and neck pain.  Skin: Negative for rash.  Allergic/Immunologic: Negative for environmental allergies.  Neurological: Negative for dizziness, weakness, light-headedness, numbness and headaches.  Hematological: Negative for adenopathy.  Psychiatric/Behavioral: Negative for behavioral problems, dysphoric mood and sleep disturbance.   Per HPI unless specifically indicated above      Objective:    BP (!) 118/54   Pulse 89   Temp (!) 97.1 F (36.2 C) (Temporal)   Resp 16   Ht 5\' 7"  (1.702 m)   Wt 254 lb (115.2 kg)   SpO2 100%   BMI 39.78 kg/m   Wt Readings from Last 3 Encounters:  01/13/20 254 lb (115.2 kg)  09/13/19 252 lb (114.3 kg)  05/07/14 259 lb 12 oz (117.8 kg)    Physical Exam Vitals and nursing note reviewed.   Constitutional:      General: He is not in acute distress.    Appearance: He is well-developed and well-nourished. He is obese. He is not diaphoretic.     Comments: Well-appearing, comfortable, cooperative  HENT:     Head: Normocephalic and atraumatic.     Mouth/Throat:     Mouth: Oropharynx is clear and moist.  Eyes:     General:        Right eye: No discharge.        Left eye: No discharge.     Extraocular Movements: EOM normal.     Conjunctiva/sclera: Conjunctivae normal.     Pupils: Pupils are equal, round, and reactive to light.  Neck:     Thyroid: No thyromegaly.  Cardiovascular:     Rate and Rhythm: Normal rate and regular rhythm.     Pulses: Intact distal pulses.     Heart sounds: Normal heart sounds. No murmur heard.   Pulmonary:     Effort: Pulmonary effort is normal. No respiratory distress.     Breath sounds: Normal breath sounds. No wheezing or rales.  Abdominal:     General: Bowel sounds are normal. There is no distension.     Palpations: Abdomen is soft. There is no mass.     Tenderness: There is no abdominal tenderness.  Musculoskeletal:        General: No tenderness or edema. Normal range of motion.     Cervical back: Normal range of motion and neck supple.     Right lower leg: No edema (trace).     Left lower leg: No edema ( trace).     Comments: Upper / Lower Extremities: - Normal muscle tone, strength bilateral upper extremities 5/5, lower extremities 5/5  Lymphadenopathy:     Cervical: No cervical adenopathy.  Skin:    General: Skin is warm and dry.     Findings: No erythema or rash.  Neurological:     Mental Status: He is alert and oriented to person, place, and time.     Comments: Distal sensation intact to light touch all extremities  Psychiatric:        Mood and Affect: Mood and affect normal.  Behavior: Behavior normal.     Comments: Well groomed, good eye contact, normal speech and thoughts    Results for orders placed or performed  in visit on 01/13/20  Hemoglobin A1c  Result Value Ref Range   Hemoglobin A1C 7.2       Assessment & Plan:   Problem List Items Addressed This Visit    Type 2 diabetes mellitus with diabetic nephropathy ([place])    Well-controlled DM with A1c 7.2 (11/2019), improved control on insulin regimen per Endocrinology) Complications - CKD III, Hyperlipidemia, GERD, obesity, CAD -  increases risk of future cardiovascular complications   Plan:  1. Continue current therapy -  Toujeo insulin 70 units daily, Glyburide 5mg  x 2 tab BID, Metformin IR 500mg  BID, Humalog 18 unit w/ meal plus SSI dosing 18-21 units avg, skip humalog if skip meal.  2. Encourage improved lifestyle - low carb, low sugar diet, reduce portion size, continue improving regular exercise 3. Check CBG , bring log to next visit for review 4. Continue ASA,  ARB, Statin      Relevant Medications   glyBURIDE (DIABETA) 5 MG tablet   Morbid obesity with BMI of 40.0-44.9, adult (HCC)    Abnormal weight, BMI >39, weight down Encourage lifestyle diet exercise      Relevant Medications   glyBURIDE (DIABETA) 5 MG tablet   Hyperlipidemia associated with type 2 diabetes mellitus (HCC)    Controlled cholesterol on statin  lifestyle Last lipid panel 01/2019 CAD, PAD  Plan: 1. Continue current meds - Atorvastatin 20mg  daily 2. On ASA 325 3 Encourage improved lifestyle - low carb/cholesterol, reduce portion size, continue improving regular exercise      Relevant Medications   glyBURIDE (DIABETA) 5 MG tablet   Gout, chronic    Chronic problem Recurrent episodic flares - none recently. Uric acid 7.8 Mostly controlled on Allopurinol 100mg  daily Has colchicine PRN only Caution with CKD      Esophageal reflux    Chronic GERD Controlled on low dose PPI Reorder rx Esomeprazole 20mg  daily  - will need PA likely      Relevant Medications   esomeprazole (NEXIUM) 20 MG capsule   Benign prostatic hyperplasia without lower urinary tract  symptoms   Benign hypertension with CKD (chronic kidney disease) stage III (HCC)    Well-controlled HTN Complication with CKDIII, CAD, edema    Plan:  1. Continue current BP regimen Carvedilol 25mg  BID, Telmisartan-HCTZ 80-25mg  daily 2. Encourage improved lifestyle - low sodium diet, regular exercise 3. Continue monitor BP outside office, bring readings to next visit, if persistently >140/90 or new symptoms notify office sooner      Relevant Medications   carvedilol (COREG) 25 MG tablet    Other Visit Diagnoses    Annual physical exam    -  Primary      Updated Health Maintenance information - He declines further colon CA screening at age 4+ Reviewed recent lab results with patient Encouraged improvement to lifestyle with diet and exercise - Goal of weight loss   Meds ordered this encounter  Medications  . esomeprazole (NEXIUM) 20 MG capsule    Sig: Take 1 capsule (20 mg total) by mouth daily before breakfast.    Dispense:  90 capsule    Refill:  3  . glyBURIDE (DIABETA) 5 MG tablet    Sig: Take 2 tablets (10 mg total) by mouth 2 (two) times daily with a meal.    Dispense:  360 tablet    Refill:  3  . carvedilol (COREG) 25 MG tablet    Sig: Take 1 tablet (25 mg total) by mouth 2 (two) times daily with a meal.    Dispense:  180 tablet    Refill:  3      Follow up plan: Return in about 6 months (around 07/12/2020) for 6 month DM A1c, CKD.  Nobie Putnam, DO [facility] 01/13/2020, 9:15 AM

## 2020-01-13 NOTE — Assessment & Plan Note (Signed)
Chronic GERD Controlled on low dose PPI Reorder rx Esomeprazole 20mg  daily  - will need PA likely

## 2020-01-13 NOTE — Assessment & Plan Note (Signed)
Abnormal weight, BMI >39, weight down Encourage lifestyle diet exercise

## 2020-03-16 LAB — HEMOGLOBIN A1C: Hemoglobin A1C: 7

## 2020-03-24 ENCOUNTER — Ambulatory Visit: Admit: 2020-03-24 | Payer: Federal, State, Local not specified - PPO | Admitting: Ophthalmology

## 2020-03-24 SURGERY — PHACOEMULSIFICATION, CATARACT, WITH IOL INSERTION
Anesthesia: Topical | Laterality: Left

## 2020-07-13 ENCOUNTER — Encounter: Payer: Self-pay | Admitting: Family Medicine

## 2020-07-13 ENCOUNTER — Other Ambulatory Visit: Payer: Self-pay

## 2020-07-13 ENCOUNTER — Other Ambulatory Visit: Payer: Self-pay | Admitting: Family Medicine

## 2020-07-13 ENCOUNTER — Ambulatory Visit: Payer: Federal, State, Local not specified - PPO | Admitting: Family Medicine

## 2020-07-13 VITALS — BP 132/54 | HR 78 | Ht 66.5 in | Wt 251.4 lb

## 2020-07-13 DIAGNOSIS — E1121 Type 2 diabetes mellitus with diabetic nephropathy: Secondary | ICD-10-CM | POA: Diagnosis not present

## 2020-07-13 DIAGNOSIS — I129 Hypertensive chronic kidney disease with stage 1 through stage 4 chronic kidney disease, or unspecified chronic kidney disease: Secondary | ICD-10-CM

## 2020-07-13 DIAGNOSIS — E785 Hyperlipidemia, unspecified: Secondary | ICD-10-CM

## 2020-07-13 DIAGNOSIS — B372 Candidiasis of skin and nail: Secondary | ICD-10-CM | POA: Diagnosis not present

## 2020-07-13 DIAGNOSIS — E1169 Type 2 diabetes mellitus with other specified complication: Secondary | ICD-10-CM

## 2020-07-13 DIAGNOSIS — N183 Chronic kidney disease, stage 3 unspecified: Secondary | ICD-10-CM

## 2020-07-13 DIAGNOSIS — M1A0790 Idiopathic chronic gout, unspecified ankle and foot, without tophus (tophi): Secondary | ICD-10-CM

## 2020-07-13 DIAGNOSIS — Z0000 Encounter for general adult medical examination without abnormal findings: Secondary | ICD-10-CM

## 2020-07-13 DIAGNOSIS — N400 Enlarged prostate without lower urinary tract symptoms: Secondary | ICD-10-CM

## 2020-07-13 DIAGNOSIS — L853 Xerosis cutis: Secondary | ICD-10-CM

## 2020-07-13 DIAGNOSIS — Z794 Long term (current) use of insulin: Secondary | ICD-10-CM

## 2020-07-13 DIAGNOSIS — I25810 Atherosclerosis of coronary artery bypass graft(s) without angina pectoris: Secondary | ICD-10-CM

## 2020-07-13 MED ORDER — FLUCONAZOLE 150 MG PO TABS
150.0000 mg | ORAL_TABLET | ORAL | 2 refills | Status: DC
Start: 2020-07-13 — End: 2020-09-23

## 2020-07-13 MED ORDER — TAMSULOSIN HCL 0.4 MG PO CAPS: 0.4000 mg | ORAL_CAPSULE | Freq: Every day | ORAL | 3 refills | Status: DC

## 2020-07-13 MED ORDER — HYDROCORTISONE VALERATE 0.2 % EX CREA: 1.0000 "application " | TOPICAL_CREAM | Freq: Two times a day (BID) | CUTANEOUS | 2 refills | Status: AC

## 2020-07-13 NOTE — Assessment & Plan Note (Signed)
Followed by [facility] Cardiology Dr Nehemiah Massed S/p CABG x 5 in 2001 Has had CAD of bypass graft Last stress test / ECHO 2020 On med management ASA, Coreg, ARB, Atorvastatin

## 2020-07-13 NOTE — Assessment & Plan Note (Signed)
Controlled BPH Refill Tamsulosin

## 2020-07-13 NOTE — Assessment & Plan Note (Signed)
Well-controlled DM with A1c 7.0 (03/2020) improved control on insulin regimen per Endocrinology Complications - CKD III, Hyperlipidemia, GERD, obesity, CAD -  increases risk of future cardiovascular complications   Off Metformin due to CKD  Plan:  1. Continue current therapy -  Toujeo, Lispro, Glyburide 2. Encourage improved lifestyle - low carb, low sugar diet, reduce portion size, continue improving regular exercise 3. Check CBG , bring log to next visit for review 4. Continue ASA,  ARB, Statin

## 2020-07-13 NOTE — Assessment & Plan Note (Signed)
Controlled cholesterol on statin  lifestyle Last lipid panel 01/2019 CAD, PAD  Plan: 1. Continue current meds - Atorvastatin 20mg  daily 2. On ASA 325 3 Encourage improved lifestyle - low carb/cholesterol, reduce portion size, continue improving regular exercise

## 2020-07-13 NOTE — Assessment & Plan Note (Signed)
Well-controlled HTN Complication with CKDIII, CAD, edema Followed by Nephrology    Plan:  1. Continue current BP regimen Carvedilol 25mg  BID, Telmisartan-HCTZ 80-25mg  daily 2. Encourage improved lifestyle - low sodium diet, regular exercise 3. Continue monitor BP outside office, bring readings to next visit, if persistently >140/90 or new symptoms notify office sooner

## 2020-07-13 NOTE — Patient Instructions (Addendum)
Thank you for coming to the office today.  Take Anti fungal pill Diflucan (Fluconazole) once weekly for 4 weeks Topical cream for face  DUE for FASTING BLOOD WORK (no food or drink after midnight before the lab appointment, only water or coffee without cream/sugar on the morning of)  SCHEDULE "Lab Only" visit in the morning at the clinic for lab draw in  6 MONTHS   - Make sure Lab Only appointment is at about 1 week before your next appointment, so that results will be available  For Lab Results, once available within 2-3 days of blood draw, you can can log in to MyChart online to view your results and a brief explanation. Also, we can discuss results at next follow-up visit.   Please schedule a Follow-up Appointment to: Return in about 6 months (around 01/13/2021) for 6 month fasting lab only then 1 week later Annual Physical.  If you have any other questions or concerns, please feel free to call the office or send a message through [place]. You may also schedule an earlier appointment if necessary.  Additionally, you may be receiving a survey about your experience at our office within a few days to 1 week by e-mail or mail. We value your feedback.  Nobie Putnam, DO [place]

## 2020-07-13 NOTE — Progress Notes (Signed)
Subjective:    Patient ID: [name], male    DOB: 08-17-1945, 75 y.o.   MRN: [id number]  [name] is a 75 y.o. male presenting on 07/13/2020 for Diabetes   HPI  Specialists Nephrology - Dr Murlean Iba ([place]) Endocrinology - Dr Mee Hives [facility] Endocrinology) Cardiology - Dr Serafina Royals [facility] Cardiology)   CHRONIC DM, Type 2: Chronic problem.  Followed by [facility] Endocrinology He was taken off Metformin 4 months ago by Nephrology. Improved blood sugars A1c 7.0 last, and CBGs < 200 History of pancreatitis in past, he was not determined to be good candidate for GLP1 therapy, also has history of reduced / fluctuating renal function, not on SGLT2. Meds: Toujeo insulin basal, Lispro, Glyburide. Reports  good compliance. Tolerating well w/o side-effects Currently on ARB Has [facility]) 10/18/19 Denies hypoglycemia, polyuria, visual changes, numbness or tingling.   HTN CKD III CAD / S/p CABG x5 in 2001 [facility] Cardiology Dr Nehemiah Massed Last pharmacological stress, done 04/2018 showed no ischemia, ECHO had normal LVEF On Carvedilol 25mg  BID, Telmisartan-HCTZ 80-25mg  daily Torsemide per Nephro/Cards   GERD Chronic problem On esomeprazole 20mg  daily   Candidal Intertrigo Reports skin rash in groin present for long time over past few years episodic flares worse in summer with heat and sweating triggers rash. Also has some dry skin dermatitis   Health Maintenance:    Last colonoscopy 03/2009. Now at age 36 he declines further colonoscopy or cologuard. He prefers to not know.   PSA 0.89 (negative, normal 01/2020)   Depression screen [facility] 2/9 01/13/2020 09/13/2019 01/17/2012  Decreased Interest 0 0 1  Down, Depressed, Hopeless 0 0 1  PHQ - 2 Score 0 0 2  Altered sleeping - - 0  Tired, decreased energy - - 0  Change in appetite - - 0  Feeling bad or failure about yourself  - - 0  Trouble  concentrating - - 1  Moving slowly or fidgety/restless - - 0  Suicidal thoughts - - 0  PHQ-9 Score - - 3    Social History   Tobacco Use   Smoking status: Never   Smokeless tobacco: Never   Tobacco comments:    tobacco use- no   Substance Use Topics   Alcohol use: No   Drug use: No    Review of Systems Per HPI unless specifically indicated above     Objective:    BP (!) 132/54   Pulse 78   Ht 5' 6.5" (1.689 m)   Wt 251 lb 6.4 oz (114 kg)   SpO2 100%   BMI 39.97 kg/m   Wt Readings from Last 3 Encounters:  07/13/20 251 lb 6.4 oz (114 kg)  01/13/20 254 lb (115.2 kg)  09/13/19 252 lb (114.3 kg)    Physical Exam Vitals and nursing note reviewed.  Constitutional:      General: He is not in acute distress.    Appearance: Normal appearance. He is well-developed. He is not diaphoretic.     Comments: Well-appearing, comfortable, cooperative  HENT:     Head: Normocephalic and atraumatic.  Eyes:     General:        Right eye: No discharge.        Left eye: No discharge.     Conjunctiva/sclera: Conjunctivae normal.  Cardiovascular:     Rate and Rhythm: Normal rate.  Pulmonary:     Effort: Pulmonary effort is normal.  Skin:  General: Skin is warm and dry.     Findings: No erythema or rash.  Neurological:     Mental Status: He is alert and oriented to person, place, and time.  Psychiatric:        Mood and Affect: Mood normal.        Behavior: Behavior normal.        Thought Content: Thought content normal.     Comments: Well groomed, good eye contact, normal speech and thoughts    Recent Labs    07/15/19 0000 11/18/19 0000 03/16/20 0000  HGBA1C 7.7 7.2 7.0    Results for orders placed or performed in visit on 07/13/20  Hemoglobin A1c  Result Value Ref Range   Hemoglobin A1C 7.0       Assessment & Plan:   Problem List Items Addressed This Visit     Type 2 diabetes mellitus with diabetic nephropathy ([place]) - Primary    Well-controlled DM with A1c  7.0 (03/2020) improved control on insulin regimen per Endocrinology Complications - CKD III, Hyperlipidemia, GERD, obesity, CAD -  increases risk of future cardiovascular complications   Off Metformin due to CKD  Plan:  1. Continue current therapy -  Toujeo, Lispro, Glyburide 2. Encourage improved lifestyle - low carb, low sugar diet, reduce portion size, continue improving regular exercise 3. Check CBG , bring log to next visit for review 4. Continue ASA,  ARB, Statin       Hyperlipidemia associated with type 2 diabetes mellitus ([place])    Controlled cholesterol on statin  lifestyle Last lipid panel 01/2019 CAD, PAD  Plan: 1. Continue current meds - Atorvastatin 20mg  daily 2. On ASA 325 3 Encourage improved lifestyle - low carb/cholesterol, reduce portion size, continue improving regular exercise       CAD, ARTERY BYPASS GRAFT    Followed by [facility] Cardiology Dr Nehemiah Massed S/p CABG x 5 in 2001 Has had CAD of bypass graft Last stress test / ECHO 2020 On med management ASA, Coreg, ARB, Atorvastatin       Benign prostatic hyperplasia without lower urinary tract symptoms    Controlled BPH Refill Tamsulosin       Relevant Medications   tamsulosin (FLOMAX) 0.4 MG CAPS capsule   Benign hypertension with CKD (chronic kidney disease) stage III (HCC)    Well-controlled HTN Complication with CKDIII, CAD, edema Followed by Nephrology    Plan:  1. Continue current BP regimen Carvedilol 25mg  BID, Telmisartan-HCTZ 80-25mg  daily 2. Encourage improved lifestyle - low sodium diet, regular exercise 3. Continue monitor BP outside office, bring readings to next visit, if persistently >140/90 or new symptoms notify office sooner       Other Visit Diagnoses     Candidal intertrigo       Relevant Medications   fluconazole (DIFLUCAN) 150 MG tablet   Dry skin dermatitis       Relevant Medications   hydrocortisone valerate cream (WESTCORT) 0.2 %       Candidal intertrigo,  recurrent Failed topical therapy Trial on oral Diflucan 150mg  weekly x 4 weeks w/ refill Keep area dry  Dry Skin Dermatitis Skin folds, face  Use rx lower potency hydrocortisone PRN  Meds ordered this encounter  Medications   tamsulosin (FLOMAX) 0.4 MG CAPS capsule    Sig: Take 1 capsule (0.4 mg total) by mouth daily after breakfast.    Dispense:  90 capsule    Refill:  3   fluconazole (DIFLUCAN) 150 MG tablet    Sig:  Take 1 tablet (150 mg total) by mouth once a week. For 4 weeks.    Dispense:  4 tablet    Refill:  2   hydrocortisone valerate cream (WESTCORT) 0.2 %    Sig: Apply 1 application topically 2 (two) times daily.    Dispense:  45 g    Refill:  2      Follow up plan: Return in about 6 months (around 01/13/2021) for 6 month fasting lab only then 1 week later Annual Physical.  Future labs ordered for 01/2021  Nobie Putnam, [facility] 07/13/2020, 8:12 AM

## 2020-08-29 ENCOUNTER — Other Ambulatory Visit: Payer: Self-pay | Admitting: Family Medicine

## 2020-08-29 DIAGNOSIS — M1A0790 Idiopathic chronic gout, unspecified ankle and foot, without tophus (tophi): Secondary | ICD-10-CM

## 2020-08-29 NOTE — Telephone Encounter (Signed)
Requested Prescriptions  Pending Prescriptions Disp Refills  . allopurinol (ZYLOPRIM) 100 MG tablet [Pharmacy Med Name: ALLOPURINOL  TAB '100MG'$ ] 90 tablet 3    Sig: TAKE 1 TABLET DAILY     Endocrinology:  Gout Agents Failed - 08/29/2020 11:50 AM      Failed - Cr in normal range and within 360 days    Creat  Date Value Ref Range Status  01/08/2020 2.33 (H) 0.70 - 1.18 mg/dL Final    Comment:    For patients >76 years of age, the reference limit for Creatinine is approximately 13% higher for people identified as African-American. .    Creatinine,U  Date Value Ref Range Status  01/17/2012 179.2 mg/dL Final         Passed - Uric Acid in normal range and within 360 days    Uric Acid, Serum  Date Value Ref Range Status  01/08/2020 7.8 4.0 - 8.0 mg/dL Final    Comment:    Therapeutic target for gout patients: <6.0 mg/dL .          Passed - Valid encounter within last 12 months    Recent Outpatient Visits          1 month ago Type 2 diabetes mellitus with diabetic nephropathy, with long-term current use of insulin [facility])   [place], DO   7 months ago Annual physical exam   [facility] Olin Hauser, DO   11 months ago Type 2 diabetes mellitus with diabetic nephropathy, with long-term current use of insulin [facility])   [facility], Devonne Doughty, DO

## 2020-09-23 ENCOUNTER — Ambulatory Visit: Payer: Federal, State, Local not specified - PPO | Admitting: Dermatology

## 2020-09-23 ENCOUNTER — Other Ambulatory Visit: Payer: Self-pay

## 2020-09-23 DIAGNOSIS — L570 Actinic keratosis: Secondary | ICD-10-CM

## 2020-09-23 DIAGNOSIS — R21 Rash and other nonspecific skin eruption: Secondary | ICD-10-CM

## 2020-09-23 DIAGNOSIS — L304 Erythema intertrigo: Secondary | ICD-10-CM

## 2020-09-23 DIAGNOSIS — L820 Inflamed seborrheic keratosis: Secondary | ICD-10-CM | POA: Diagnosis not present

## 2020-09-23 DIAGNOSIS — L821 Other seborrheic keratosis: Secondary | ICD-10-CM

## 2020-09-23 DIAGNOSIS — L814 Other melanin hyperpigmentation: Secondary | ICD-10-CM

## 2020-09-23 DIAGNOSIS — D229 Melanocytic nevi, unspecified: Secondary | ICD-10-CM

## 2020-09-23 DIAGNOSIS — D1800 Hemangioma unspecified site: Secondary | ICD-10-CM

## 2020-09-23 DIAGNOSIS — L578 Other skin changes due to chronic exposure to nonionizing radiation: Secondary | ICD-10-CM

## 2020-09-23 DIAGNOSIS — Z1283 Encounter for screening for malignant neoplasm of skin: Secondary | ICD-10-CM

## 2020-09-23 DIAGNOSIS — Z85828 Personal history of other malignant neoplasm of skin: Secondary | ICD-10-CM

## 2020-09-23 MED ORDER — FLUCONAZOLE 200 MG PO TABS: 200.0000 mg | ORAL_TABLET | ORAL | 1 refills | Status: DC

## 2020-09-23 NOTE — Patient Instructions (Addendum)
If you have any questions or concerns for your doctor, please call our main line at [phone] and press option 4 to reach your doctor's medical assistant. If no one answers, please leave a voicemail as directed and we will return your call as soon as possible. Messages left after 4 pm will be answered the following business day.   You may also send us a message via MyChart. We typically respond to MyChart messages within 1-2 business days.  For prescription refills, please ask your pharmacy to contact our office. Our fax number is [phone].  If you have an urgent issue when the clinic is closed that cannot wait until the next business day, you can page your doctor at the number below.    Please note that while we do our best to be available for urgent issues outside of office hours, we are not available 24/7.   If you have an urgent issue and are unable to reach us, you may choose to seek medical care at your doctor's office, retail clinic, urgent care center, or emergency room.  If you have a medical emergency, please immediately call 911 or go to the emergency department.  Pager Numbers  - Dr. Kowalski: [phone]  - Dr. Moye: [phone]  - Dr. Stewart: [phone]  In the event of inclement weather, please call our main line at [phone] for an update on the status of any delays or closures.  Dermatology Medication Tips: Please keep the boxes that topical medications come in in order to help keep track of the instructions about where and how to use these. Pharmacies typically print the medication instructions only on the boxes and not directly on the medication tubes.   If your medication is too expensive, please contact our office at [phone] option 4 or send us a message through MyChart.   We are unable to tell what your co-pay for medications will be in advance as this is different depending on your insurance coverage. However, we may be able to find a substitute  medication at lower cost or fill out paperwork to get insurance to cover a needed medication.   If a prior authorization is required to get your medication covered by your insurance company, please allow us 1-2 business days to complete this process.  Drug prices often vary depending on where the prescription is filled and some pharmacies may offer cheaper prices.  The website www.goodrx.com contains coupons for medications through different pharmacies. The prices here do not account for what the cost may be with help from insurance (it may be cheaper with your insurance), but the website can give you the price if you did not use any insurance.  - You can print the associated coupon and take it with your prescription to the pharmacy.  - You may also stop by our office during regular business hours and pick up a GoodRx coupon card.  - If you need your prescription sent electronically to a different pharmacy, notify our office through [facility] MyChart or by phone at [phone] option 4.  Instructions for [facility] Medications  One or more of your medications was sent to the [facility] mail order compounding pharmacy. You will receive an email from them and can purchase the medicine through that link. It will then be mailed to your home at the address you confirmed. If for any reason you do not receive an email from them, please check your spam folder. If you still do not find the email,   please let us know. [facility] phone number is [phone].   

## 2020-09-23 NOTE — Progress Notes (Signed)
Follow-Up Visit   Subjective  [name] is a 75 y.o. male who presents for the following: Total body skin exam (Hx of BCC R side of face, hx of AKs) and Rash (Groin, ~98m, itchy, has taken Diflucan 150mg  1 po q week for 4 weeks in past). The patient presents for Total-Body Skin Exam (TBSE) for skin cancer screening and mole check.   The following portions of the chart were reviewed this encounter and updated as appropriate:   Tobacco  Allergies  Meds  Problems  Med Hx  Surg Hx  Fam Hx     Review of Systems:  No other skin or systemic complaints except as noted in HPI or Assessment and Plan.  Objective  Well appearing patient in no apparent distress; mood and affect are within normal limits.  A full examination was performed including scalp, head, eyes, ears, nose, lips, neck, chest, axillae, abdomen, back, buttocks, bilateral upper extremities, bilateral lower extremities, hands, feet, fingers, toes, fingernails, and toenails. All findings within normal limits unless otherwise noted below.  R side of face Well healed scar with no evidence of recurrence.   L deltoid Erythema L deltoid  groin Erythema maceration groin and under abdominal fold  face x 1 Pink scaly macules   face x 4, Total = 4 (4) Erythematous keratotic or waxy stuck-on papule or plaque.    Assessment & Plan   Lentigines - Scattered tan macules - Due to sun exposure - Benign-appearing, observe - Recommend daily broad spectrum sunscreen SPF 30+ to sun-exposed areas, reapply every 2 hours as needed. - Call for any changes  Seborrheic Keratoses - Stuck-on, waxy, tan-brown papules and/or plaques  - Benign-appearing - Discussed benign etiology and prognosis. - Observe - Call for any changes  Melanocytic Nevi - Tan-brown and/or pink-flesh-colored symmetric macules and papules - Benign appearing on exam today - Observation - Call clinic for new or changing moles - Recommend daily use of  broad spectrum spf 30+ sunscreen to sun-exposed areas.   Hemangiomas - Red papules - Discussed benign nature - Observe - Call for any changes  Actinic Damage - Chronic condition, secondary to cumulative UV/sun exposure - diffuse scaly erythematous macules with underlying dyspigmentation - Recommend daily broad spectrum sunscreen SPF 30+ to sun-exposed areas, reapply every 2 hours as needed.  - Staying in the shade or wearing long sleeves, sun glasses (UVA+UVB protection) and wide brim hats (4-inch brim around the entire circumference of the hat) are also recommended for sun protection.  - Call for new or changing lesions.  Skin cancer screening performed today.  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 Observe for recurrence. Call clinic for new or changing lesions.  Recommend regular skin exams, daily broad-spectrum spf 30+ sunscreen use, and photoprotection.    Rash - inflammation from vaccine L deltoid 2ndary to shingles shot  Intertrigo groin  Intertrigo is a chronic recurrent rash that occurs in skin fold areas that may be associated with friction; heat; moisture; yeast; fungus; and bacteria.  It is exacerbated by increased movement / activity; sweating; and higher atmospheric temperature.  Start Diflucan 200mg  1 po 2x/wk Monday and Thursday #8, 1rf Start [facility] Iodoquinol 1%, Hydrocortisone 2.5%, Niacinamide 2% Cream twice a day to affected areas for up to two weeks.  The patient was advised this is not covered by insurance since it is made by a compounding pharmacy. They will receive an email to check out and the medication  will be mailed to their home.    fluconazole (DIFLUCAN) 200 MG tablet - groin Take 1 tablet (200 mg total) by mouth 2 (two) times a week. Take 1 po on Monday and Thursday, do not take colchicine on days taking fluconazole  AK (actinic keratosis) face x 1  Destruction of lesion - face x 1 Complexity: simple   Destruction  method: cryotherapy   Informed consent: discussed and consent obtained   Timeout:  patient name, date of birth, surgical site, and procedure verified Lesion destroyed using liquid nitrogen: Yes   Region frozen until ice ball extended beyond lesion: Yes   Outcome: patient tolerated procedure well with no complications   Post-procedure details: wound care instructions given    Inflamed seborrheic keratosis face x 4, Total = 4  Destruction of lesion - face x 4, Total = 4 Complexity: simple   Destruction method: cryotherapy   Informed consent: discussed and consent obtained   Timeout:  patient name, date of birth, surgical site, and procedure verified Lesion destroyed using liquid nitrogen: Yes   Region frozen until ice ball extended beyond lesion: Yes   Outcome: patient tolerated procedure well with no complications   Post-procedure details: wound care instructions given    Return in about 2 months (around 11/23/2020) for Intertrigo f/u.  I, [name], RMA, am acting as scribe for Martin Ser, MD . Documentation: I have reviewed the above documentation for accuracy and completeness, and I agree with the above.  Martin Ser, MD

## 2020-09-25 ENCOUNTER — Encounter: Payer: Self-pay | Admitting: Dermatology

## 2020-12-02 ENCOUNTER — Other Ambulatory Visit: Payer: Self-pay

## 2020-12-02 ENCOUNTER — Ambulatory Visit: Payer: Federal, State, Local not specified - PPO | Admitting: Dermatology

## 2020-12-02 DIAGNOSIS — L820 Inflamed seborrheic keratosis: Secondary | ICD-10-CM | POA: Diagnosis not present

## 2020-12-02 DIAGNOSIS — D692 Other nonthrombocytopenic purpura: Secondary | ICD-10-CM | POA: Diagnosis not present

## 2020-12-02 DIAGNOSIS — L304 Erythema intertrigo: Secondary | ICD-10-CM

## 2020-12-02 DIAGNOSIS — L578 Other skin changes due to chronic exposure to nonionizing radiation: Secondary | ICD-10-CM | POA: Diagnosis not present

## 2020-12-02 DIAGNOSIS — L570 Actinic keratosis: Secondary | ICD-10-CM | POA: Diagnosis not present

## 2020-12-02 DIAGNOSIS — L814 Other melanin hyperpigmentation: Secondary | ICD-10-CM

## 2020-12-02 DIAGNOSIS — L219 Seborrheic dermatitis, unspecified: Secondary | ICD-10-CM

## 2020-12-02 MED ORDER — KETOCONAZOLE 2 % EX CREA: TOPICAL_CREAM | CUTANEOUS | 6 refills | Status: DC

## 2020-12-02 NOTE — Progress Notes (Signed)
Follow-Up Visit   Subjective  [name] is a 75 y.o. male who presents for the following: intertrigo (In the groin - much improved with [facility] mix and Diflucan 200mg  po 2x/wk. ). Patient has irregular lesions on the face and arms that he would like checked today.  The patient has spots, moles and lesions to be evaluated, some may be new or changing and the patient has concerns that these could be cancer.  The following portions of the chart were reviewed this encounter and updated as appropriate:   Tobacco  Allergies  Meds  Problems  Med Hx  Surg Hx  Fam Hx     Review of Systems:  No other skin or systemic complaints except as noted in HPI or Assessment and Plan.  Objective  Well appearing patient in no apparent distress; mood and affect are within normal limits.  A focused examination was performed including the face. Relevant physical exam findings are noted in the Assessment and Plan.  Face Pink patches with greasy scale.   Face x 3 (3) Erythematous thin papules/macules with gritty scale.   Face x 2, R arm x 1 (3) Erythematous keratotic or waxy stuck-on papule or plaque.    Assessment & Plan  Erythema intertrigo -  Improved but not to goal. Groin Intertrigo is a chronic recurrent rash that occurs in skin fold areas that may be associated with friction; heat; moisture; yeast; fungus; and bacteria.  It is exacerbated by increased movement / activity; sweating; and higher atmospheric temperature.  Continue [facility] intertrigo mix QD PRN.   Seborrheic dermatitis Improved, but not to goal. Face Seborrheic Dermatitis  -  is a chronic persistent rash characterized by pinkness and scaling most commonly of the mid face but also can occur on the scalp (dandruff), ears; mid chest, mid back and groin.  It tends to be exacerbated by stress and cooler weather.  People who have neurologic disease may experience new onset or exacerbation of existing  seborrheic dermatitis.  The condition is not curable but treatable and can be controlled.  Continue Ketoconazole 2% cream QOD.   Related Medications ketoconazole (NIZORAL) 2 % cream Apply to the face qhs M/W/F  AK (actinic keratosis) (3) Face x 3 Destruction of lesion - Face x 3 Complexity: simple   Destruction method: cryotherapy   Informed consent: discussed and consent obtained   Timeout:  patient name, date of birth, surgical site, and procedure verified Lesion destroyed using liquid nitrogen: Yes   Region frozen until ice ball extended beyond lesion: Yes   Outcome: patient tolerated procedure well with no complications   Post-procedure details: wound care instructions given    Inflamed seborrheic keratosis Face x 2, R arm x 1 Destruction of lesion - Face x 2, R arm x 1 Complexity: simple   Destruction method: cryotherapy   Informed consent: discussed and consent obtained   Timeout:  patient name, date of birth, surgical site, and procedure verified Lesion destroyed using liquid nitrogen: Yes   Region frozen until ice ball extended beyond lesion: Yes   Outcome: patient tolerated procedure well with no complications   Post-procedure details: wound care instructions given    Lentigines - Scattered tan macules - Due to sun exposure - Benign-appering, observe - Recommend daily broad spectrum sunscreen SPF 30+ to sun-exposed areas, reapply every 2 hours as needed. - Call for any changes  Purpura - Chronic; persistent and recurrent.  Treatable, but not curable. - Violaceous macules and  patches - Benign - Related to trauma, age, sun damage and/or use of blood thinners, chronic use of topical and/or oral steroids - Observe - Can use OTC arnica containing moisturizer such as Dermend Bruise Formula if desired - Call for worsening or other concerns  Actinic Damage - chronic, secondary to cumulative UV radiation exposure/sun exposure over time - diffuse scaly erythematous  macules with underlying dyspigmentation - Recommend daily broad spectrum sunscreen SPF 30+ to sun-exposed areas, reapply every 2 hours as needed.  - Recommend staying in the shade or wearing long sleeves, sun glasses (UVA+UVB protection) and wide brim hats (4-inch brim around the entire circumference of the hat). - Call for new or changing lesions.  Return in about 6 months (around 06/01/2021) for TBSE.  Luther Redo, CMA, am acting as scribe for [name], MD . Documentation: I have reviewed the above documentation for accuracy and completeness, and I agree with the above.  [name], MD

## 2020-12-02 NOTE — Patient Instructions (Addendum)
Instructions for [facility] Medications  One or more of your medications was sent to the [facility] mail order compounding pharmacy. You will receive an email from them and can purchase the medicine through that link. It will then be mailed to your home at the address you confirmed. If for any reason you do not receive an email from them, please check your spam folder. If you still do not find the email, please let us know. [facility] phone number is [phone].     If You Need Anything After Your Visit  If you have any questions or concerns for your doctor, please call our main line at [phone] and press option 4 to reach your doctor's medical assistant. If no one answers, please leave a voicemail as directed and we will return your call as soon as possible. Messages left after 4 pm will be answered the following business day.   You may also send [place] a message via [place]. We typically respond to MyChart messages within 1-2 business days.  For prescription refills, please ask your pharmacy to contact our office. Our fax number is [phone].  If you have an urgent issue when the clinic is closed that cannot wait until the next business day, you can page your doctor at the number below.    Please note that while we do our best to be available for urgent issues outside of office hours, we are not available 24/7.   If you have an urgent issue and are unable to reach [place], you may choose to seek medical care at your doctor's office, retail clinic, urgent care center, or emergency room.  If you have a medical emergency, please immediately call 911 or go to the emergency department.  Pager Numbers  - Dr. Nehemiah Massed: [phone]  - Dr. Laurence Ferrari: [phone]  - Dr. Nicole Kindred: [phone]  In the event of inclement weather, please call our main line at [phone] for an update on the status of any delays or closures.  Dermatology Medication Tips: Please keep the boxes that  topical medications come in in order to help keep track of the instructions about where and how to use these. Pharmacies typically print the medication instructions only on the boxes and not directly on the medication tubes.   If your medication is too expensive, please contact our office at [phone] option 4 or send [place] a message through [place].   We are unable to tell what your co-pay for medications will be in advance as this is different depending on your insurance coverage. However, we may be able to find a substitute medication at lower cost or fill out paperwork to get insurance to cover a needed medication.   If a prior authorization is required to get your medication covered by your insurance company, please allow [place] 1-2 business days to complete this process.  Drug prices often vary depending on where the prescription is filled and some pharmacies may offer cheaper prices.  The website www.goodrx.com contains coupons for medications through different pharmacies. The prices here do not account for what the cost may be with help from insurance (it may be cheaper with your insurance), but the website can give you the price if you did not use any insurance.  - You can print the associated coupon and take it with your prescription to the pharmacy.  - You may also stop by our office during regular business hours and pick up a GoodRx coupon card.  - If you need your  prescription sent electronically to a different pharmacy, notify our office through [facility] or by phone at [phone] option 4.     Si Usted Necesita Algo Despus de Su Visita  Tambin puede enviarnos un mensaje a travs de Pharmacist, community. Por lo general respondemos a los mensajes de MyChart en el transcurso de 1 a 2 das hbiles.  Para renovar recetas, por favor pida a su farmacia que se ponga en contacto con nuestra oficina. Harland Dingwall de fax es [place] [phone].  Si tiene un asunto urgente cuando la clnica  est cerrada y que no puede esperar hasta el siguiente da hbil, puede llamar/localizar a su doctor(a) al nmero que aparece a continuacin.   Por favor, tenga en cuenta que aunque hacemos todo lo posible para estar disponibles para asuntos urgentes fuera del horario de [place], no estamos disponibles las 24 horas del da, los 7 das de la [place].   Si tiene un problema urgente y no puede comunicarse con nosotros, puede optar por buscar atencin mdica  en el consultorio de su doctor(a), en una clnica privada, en un centro de atencin urgente o en una sala de emergencias.  Si tiene Engineering geologist, por favor llame inmediatamente al 911 o vaya a la sala de emergencias.  Nmeros de bper  - Dr. Nehemiah Massed: [phone]  - Dra. Moye: [phone]  - Dra. Nicole Kindred: [phone]  En caso de inclemencias del [place], por favor llame a Johnsie Kindred principal al [phone] para una actualizacin sobre el [place] de cualquier retraso o cierre.  Consejos para la medicacin en dermatologa: Por favor, guarde las cajas en las que vienen los medicamentos de uso tpico para ayudarle a seguir las instrucciones sobre dnde y cmo usarlos. Las farmacias generalmente imprimen las instrucciones del medicamento slo en las cajas y no directamente en los tubos del [place].   Si su medicamento es muy caro, por favor, pngase en contacto con Zigmund Daniel llamando al [phone] y presione la opcin 4 o envenos un mensaje a travs de Pharmacist, community.   No podemos decirle cul ser su copago por los medicamentos por adelantado ya que esto es diferente dependiendo de la cobertura de su seguro. Sin embargo, es posible que podamos encontrar un medicamento sustituto a Electrical engineer un formulario para que el seguro cubra el medicamento que se considera necesario.   Si se requiere una autorizacin previa para que su compaa de seguros [place] su medicamento, por favor permtanos de 1 a 2 das hbiles para  completar este proceso.  Los precios de los medicamentos varan con frecuencia dependiendo del Environmental consultant de dnde se surte la receta y alguna farmacias pueden ofrecer precios ms baratos.  El sitio web www.goodrx.com tiene cupones para medicamentos de Airline pilot. Los precios aqu no tienen en cuenta lo que podra costar con la ayuda del seguro (puede ser ms barato con su seguro), pero el sitio web puede darle el precio si no utiliz Research scientist (physical sciences).  - Puede imprimir el cupn correspondiente y llevarlo con su receta a la farmacia.  - Tambin puede pasar por nuestra oficina durante el horario de atencin regular y Charity fundraiser una tarjeta de cupones de GoodRx.  - Si necesita que su receta se enve electrnicamente a una farmacia diferente, informe a nuestra oficina a travs de MyChart de [place] o por telfono llamando al [phone] y presione la opcin 4.

## 2020-12-08 ENCOUNTER — Encounter: Payer: Self-pay | Admitting: Dermatology

## 2020-12-28 ENCOUNTER — Other Ambulatory Visit: Payer: Self-pay | Admitting: Family Medicine

## 2020-12-28 DIAGNOSIS — E1169 Type 2 diabetes mellitus with other specified complication: Secondary | ICD-10-CM

## 2020-12-29 NOTE — Telephone Encounter (Signed)
Requested Prescriptions  Pending Prescriptions Disp Refills   atorvastatin (LIPITOR) 20 MG tablet [Pharmacy Med Name: ATORVASTATIN TAB 20MG ] 90 tablet 0    Sig: TAKE 1 TABLET AT BEDTIME     Cardiovascular:  Antilipid - Statins Failed - 12/28/2020  2:14 PM      Failed - LDL in normal range and within 360 days    LDL Cholesterol (Calc)  Date Value Ref Range Status  01/08/2020  mg/dL (calc) Final    Comment:    . LDL cholesterol not calculated. Triglyceride levels greater than 400 mg/dL invalidate calculated LDL results. . Reference range: <100 . Desirable range <100 mg/dL for primary prevention;   <70 mg/dL for patients with CHD or diabetic patients  with > or = 2 CHD risk factors. Marland Kitchen LDL-C is now calculated using the Martin-Hopkins  calculation, which is a validated novel method providing  better accuracy than the Friedewald equation in the  estimation of LDL-C.  Cresenciano Genre et al. Annamaria Helling. 6767;209(47): 2061-2068  (http://education.QuestDiagnostics.com/faq/FAQ164)    Direct LDL  Date Value Ref Range Status  01/17/2012 114.4 mg/dL Final    Comment:    Optimal:  <100 mg/dLNear or Above Optimal:  100-129 mg/dLBorderline High:  130-159 mg/dLHigh:  160-189 mg/dLVery High:  >190 mg/dL         Failed - HDL in normal range and within 360 days    HDL  Date Value Ref Range Status  01/08/2020 31 (L) > OR = 40 mg/dL Final         Failed - Triglycerides in normal range and within 360 days    Triglycerides  Date Value Ref Range Status  01/08/2020 428 (H) <150 mg/dL Final    Comment:    . If a non-fasting specimen was collected, consider repeat triglyceride testing on a fasting specimen if clinically indicated.  Yates Decamp et al. J. of Clin. Lipidol. 0962;8:366-294. Marland Kitchen          Passed - Total Cholesterol in normal range and within 360 days    Cholesterol  Date Value Ref Range Status  01/08/2020 137 <200 mg/dL Final         Passed - Patient is not pregnant      Passed - Valid  encounter within last 12 months    Recent Outpatient Visits          5 months ago Type 2 diabetes mellitus with diabetic nephropathy, with long-term current use of insulin [facility])   [facility] Olin Hauser, DO   11 months ago Annual physical exam   [facility] Olin Hauser, DO   1 year ago Type 2 diabetes mellitus with diabetic nephropathy, with long-term current use of insulin ([place])   [facility] [place], Devonne Doughty, DO      Future Appointments            In 3 weeks Parks [place], Devonne Doughty, DO [facility], [place]   In 5 months Ralene Bathe, MD [place]

## 2021-01-08 ENCOUNTER — Other Ambulatory Visit: Payer: Self-pay | Admitting: Family Medicine

## 2021-01-08 DIAGNOSIS — E1121 Type 2 diabetes mellitus with diabetic nephropathy: Secondary | ICD-10-CM

## 2021-01-08 DIAGNOSIS — I129 Hypertensive chronic kidney disease with stage 1 through stage 4 chronic kidney disease, or unspecified chronic kidney disease: Secondary | ICD-10-CM

## 2021-01-08 DIAGNOSIS — N183 Chronic kidney disease, stage 3 unspecified: Secondary | ICD-10-CM

## 2021-01-08 DIAGNOSIS — Z794 Long term (current) use of insulin: Secondary | ICD-10-CM

## 2021-01-08 NOTE — Telephone Encounter (Signed)
Requested Prescriptions  Pending Prescriptions Disp Refills   glyBURIDE (DIABETA) 5 MG tablet [Pharmacy Med Name: GLYBURIDE(D) TAB 5MG ] 360 tablet 3    Sig: TAKE 2 TABLETS TWICE A DAY WITH MEALS     Endocrinology:  Diabetes - Sulfonylureas Failed - 01/08/2021  4:29 PM      Failed - HBA1C is between 0 and 7.9 and within 180 days    Hemoglobin A1C  Date Value Ref Range Status  03/16/2020 7.0  Final         Passed - Valid encounter within last 6 months    Recent Outpatient Visits          5 months ago Type 2 diabetes mellitus with diabetic nephropathy, with long-term current use of insulin ([place])   [facility], Devonne Doughty, DO   12 months ago Annual physical exam   [facility] Olin Hauser, DO   1 year ago Type 2 diabetes mellitus with diabetic nephropathy, with long-term current use of insulin ([place])   [place], DO      Future Appointments            In 1 week Parks [place], Devonne Doughty, DO [facility], [place]   In 4 months Ralene Bathe, MD [place]            carvedilol (COREG) 25 MG tablet [Pharmacy Med Name: CARVEDILOL TAB 25MG ] 180 tablet 3    Sig: TAKE 1 TABLET TWICE DAILY  WITH MEALS     Cardiovascular:  Beta Blockers Passed - 01/08/2021  4:29 PM      Passed - Last BP in normal range    BP Readings from Last 1 Encounters:  07/13/20 (!) 132/54         Passed - Last Heart Rate in normal range    Pulse Readings from Last 1 Encounters:  07/13/20 78         Passed - Valid encounter within last 6 months    Recent Outpatient Visits          5 months ago Type 2 diabetes mellitus with diabetic nephropathy, with long-term current use of insulin ([place])   [place], DO   12 months ago Annual physical exam   [facility], Devonne Doughty, DO   1 year ago Type 2 diabetes mellitus with  diabetic nephropathy, with long-term current use of insulin ([place])   [facility] [place], Devonne Doughty, DO      Future Appointments            In 1 week Parks [place], Devonne Doughty, DO [facility], [place]   In 4 months Ralene Bathe, MD [place]

## 2021-01-08 NOTE — Telephone Encounter (Signed)
Requested medication (s) are due for refill today: yes  Requested medication (s) are on the active medication list: yes  Last refill:  01/13/20 #360/3  Future visit scheduled: yes  Notes to clinic:  Unable to refill per protocol due to failed labs, no updated results.      Requested Prescriptions  Pending Prescriptions Disp Refills   glyBURIDE (DIABETA) 5 MG tablet [Pharmacy Med Name: GLYBURIDE(D) TAB 5MG ] 360 tablet 3    Sig: TAKE 2 TABLETS TWICE A DAY WITH MEALS     Endocrinology:  Diabetes - Sulfonylureas Failed - 01/08/2021  4:29 PM      Failed - HBA1C is between 0 and 7.9 and within 180 days    Hemoglobin A1C  Date Value Ref Range Status  03/16/2020 7.0  Final          Passed - Valid encounter within last 6 months    Recent Outpatient Visits           5 months ago Type 2 diabetes mellitus with diabetic nephropathy, with long-term current use of insulin ([place])   [facility], Devonne Doughty, DO   12 months ago Annual physical exam   [facility] Olin Hauser, DO   1 year ago Type 2 diabetes mellitus with diabetic nephropathy, with long-term current use of insulin ([place])   [place], DO       Future Appointments             In 1 week [place], Devonne Doughty, DO [facility], [place]   In 4 months Ralene Bathe, MD [place]            Signed Prescriptions Disp Refills   carvedilol (COREG) 25 MG tablet 180 tablet 0    Sig: TAKE 1 TABLET TWICE DAILY  WITH MEALS     Cardiovascular:  Beta Blockers Passed - 01/08/2021  4:29 PM      Passed - Last BP in normal range    BP Readings from Last 1 Encounters:  07/13/20 (!) 132/54          Passed - Last Heart Rate in normal range    Pulse Readings from Last 1 Encounters:  07/13/20 78          Passed - Valid encounter within last 6 months    Recent Outpatient Visits           5 months ago Type  2 diabetes mellitus with diabetic nephropathy, with long-term current use of insulin ([place])   [place], DO   12 months ago Annual physical exam   [facility] [place], Devonne Doughty, DO   1 year ago Type 2 diabetes mellitus with diabetic nephropathy, with long-term current use of insulin ([place])   [facility] Parks [place], Devonne Doughty, DO       Future Appointments             In 1 week Parks Ranger, Devonne Doughty, DO [facility], [place]   In 4 months Ralene Bathe, MD [place]

## 2021-01-11 ENCOUNTER — Other Ambulatory Visit: Payer: Self-pay

## 2021-01-11 DIAGNOSIS — N183 Chronic kidney disease, stage 3 unspecified: Secondary | ICD-10-CM

## 2021-01-11 DIAGNOSIS — N400 Enlarged prostate without lower urinary tract symptoms: Secondary | ICD-10-CM

## 2021-01-11 DIAGNOSIS — E1121 Type 2 diabetes mellitus with diabetic nephropathy: Secondary | ICD-10-CM

## 2021-01-11 DIAGNOSIS — E785 Hyperlipidemia, unspecified: Secondary | ICD-10-CM

## 2021-01-11 DIAGNOSIS — Z794 Long term (current) use of insulin: Secondary | ICD-10-CM

## 2021-01-11 DIAGNOSIS — M1A0790 Idiopathic chronic gout, unspecified ankle and foot, without tophus (tophi): Secondary | ICD-10-CM

## 2021-01-11 DIAGNOSIS — I129 Hypertensive chronic kidney disease with stage 1 through stage 4 chronic kidney disease, or unspecified chronic kidney disease: Secondary | ICD-10-CM

## 2021-01-11 DIAGNOSIS — Z0000 Encounter for general adult medical examination without abnormal findings: Secondary | ICD-10-CM

## 2021-01-11 DIAGNOSIS — E1169 Type 2 diabetes mellitus with other specified complication: Secondary | ICD-10-CM

## 2021-01-12 ENCOUNTER — Other Ambulatory Visit: Payer: Federal, State, Local not specified - PPO

## 2021-01-12 LAB — MICROALBUMIN, URINE: Microalb, Ur: 98

## 2021-01-13 LAB — LIPID PANEL
Cholesterol: 155 mg/dL (ref <200)
HDL: 36 mg/dL — ABNORMAL LOW (ref >=40)
LDL Cholesterol (Calc): 75 mg/dL (calc)
Non-HDL Cholesterol (Calc): 119 mg/dL (calc) (ref <130)
Total CHOL/HDL Ratio: 4.3 (calc) (ref <5.0)
Triglycerides: 364 mg/dL — ABNORMAL HIGH (ref <150)

## 2021-01-13 LAB — CBC WITH DIFFERENTIAL/PLATELET
Absolute Monocytes: 772 cells/uL (ref 200–950)
Basophils Absolute: 28 cells/uL (ref 0–200)
Basophils Relative: 0.3 %
Eosinophils Absolute: 326 cells/uL (ref 15–500)
Eosinophils Relative: 3.5 %
HCT: 34 % — ABNORMAL LOW (ref 38.5–50.0)
Hemoglobin: 11.6 g/dL — ABNORMAL LOW (ref 13.2–17.1)
Lymphs Abs: 2446 cells/uL (ref 850–3900)
MCH: 33.4 pg — ABNORMAL HIGH (ref 27.0–33.0)
MCHC: 34.1 g/dL (ref 32.0–36.0)
MCV: 98 fL (ref 80.0–100.0)
MPV: 11.9 fL (ref 7.5–12.5)
Monocytes Relative: 8.3 %
Neutro Abs: 5729 cells/uL (ref 1500–7800)
Neutrophils Relative %: 61.6 %
Platelets: 232 10*3/uL (ref 140–400)
RBC: 3.47 10*6/uL — ABNORMAL LOW (ref 4.20–5.80)
RDW: 12.8 % (ref 11.0–15.0)
Total Lymphocyte: 26.3 %
WBC: 9.3 10*3/uL (ref 3.8–10.8)

## 2021-01-13 LAB — COMPLETE METABOLIC PANEL WITH GFR
AG Ratio: 1.6 (calc) (ref 1.0–2.5)
ALT: 17 U/L (ref 9–46)
AST: 16 U/L (ref 10–35)
Albumin: 4 g/dL (ref 3.6–5.1)
Alkaline phosphatase (APISO): 98 U/L (ref 35–144)
BUN/Creatinine Ratio: 28 (calc) — ABNORMAL HIGH (ref 6–22)
BUN: 63 mg/dL — ABNORMAL HIGH (ref 7–25)
CO2: 31 mmol/L (ref 20–32)
Calcium: 9.3 mg/dL (ref 8.6–10.3)
Chloride: 98 mmol/L (ref 98–110)
Creat: 2.23 mg/dL — ABNORMAL HIGH (ref 0.70–1.28)
Globulin: 2.5 g/dL (calc) (ref 1.9–3.7)
Glucose, Bld: 174 mg/dL — ABNORMAL HIGH (ref 65–99)
Potassium: 3.9 mmol/L (ref 3.5–5.3)
Sodium: 141 mmol/L (ref 135–146)
Total Bilirubin: 0.3 mg/dL (ref 0.2–1.2)
Total Protein: 6.5 g/dL (ref 6.1–8.1)
eGFR: 30 mL/min/{1.73_m2} — ABNORMAL LOW (ref >=60)

## 2021-01-13 LAB — PSA: PSA: 1.34 ng/mL (ref <=4.00)

## 2021-01-13 LAB — HEMOGLOBIN A1C
Hgb A1c MFr Bld: 7.3 % of total Hgb — ABNORMAL HIGH (ref <5.7)
Mean Plasma Glucose: 163 mg/dL
eAG (mmol/L): 9 mmol/L

## 2021-01-13 LAB — URIC ACID: Uric Acid, Serum: 8 mg/dL (ref 4.0–8.0)

## 2021-01-13 LAB — TSH: TSH: 0.95 mIU/L (ref 0.40–4.50)

## 2021-01-19 ENCOUNTER — Other Ambulatory Visit: Payer: Self-pay

## 2021-01-19 ENCOUNTER — Encounter: Payer: Self-pay | Admitting: Family Medicine

## 2021-01-19 ENCOUNTER — Ambulatory Visit (INDEPENDENT_AMBULATORY_CARE_PROVIDER_SITE_OTHER): Payer: Federal, State, Local not specified - PPO | Admitting: Family Medicine

## 2021-01-19 VITALS — BP 137/56 | HR 83 | Ht 66.5 in | Wt 244.4 lb

## 2021-01-19 DIAGNOSIS — I129 Hypertensive chronic kidney disease with stage 1 through stage 4 chronic kidney disease, or unspecified chronic kidney disease: Secondary | ICD-10-CM | POA: Diagnosis not present

## 2021-01-19 DIAGNOSIS — Z0000 Encounter for general adult medical examination without abnormal findings: Secondary | ICD-10-CM | POA: Diagnosis not present

## 2021-01-19 DIAGNOSIS — Z794 Long term (current) use of insulin: Secondary | ICD-10-CM

## 2021-01-19 DIAGNOSIS — K219 Gastro-esophageal reflux disease without esophagitis: Secondary | ICD-10-CM | POA: Diagnosis not present

## 2021-01-19 DIAGNOSIS — E785 Hyperlipidemia, unspecified: Secondary | ICD-10-CM

## 2021-01-19 DIAGNOSIS — E1169 Type 2 diabetes mellitus with other specified complication: Secondary | ICD-10-CM

## 2021-01-19 DIAGNOSIS — N183 Chronic kidney disease, stage 3 unspecified: Secondary | ICD-10-CM

## 2021-01-19 DIAGNOSIS — E1121 Type 2 diabetes mellitus with diabetic nephropathy: Secondary | ICD-10-CM

## 2021-01-19 DIAGNOSIS — L304 Erythema intertrigo: Secondary | ICD-10-CM

## 2021-01-19 MED ORDER — ESOMEPRAZOLE MAGNESIUM 20 MG PO CPDR: 20.0000 mg | DELAYED_RELEASE_CAPSULE | Freq: Every day | ORAL | 3 refills | Status: AC

## 2021-01-19 MED ORDER — FLUCONAZOLE 200 MG PO TABS: 200.0000 mg | ORAL_TABLET | ORAL | 1 refills | Status: DC

## 2021-01-19 NOTE — Assessment & Plan Note (Signed)
Well-controlled DM with A1c 7.3 improved control on insulin regimen per Endocrinology Complications - CKD III, Hyperlipidemia, GERD, obesity, CAD -  increases risk of future cardiovascular complications   Complication HYPOGLYCEMIA with decline in renal function  Off Metformin due to CKD  Plan:  1. Continue current therapy -  Toujeo, Lispro, Glyburide per Endocrinology - however my recommendation today is to HOLD Glyburide oral sulfonylurea right now due to risk of hypoglycemia. He should discuss directly with his Endocrinology to manage this further with insulin regimen, he can reduce dosing as indicated for hypoglycemia, we discussed risks of this and hypoglycemic strategy.  2. Encourage improved lifestyle - low carb, low sugar diet, reduce portion size, continue improving regular exercise 3. Check CBG , bring log to next visit for review 4. Continue ASA,  ARB, Statin

## 2021-01-19 NOTE — Assessment & Plan Note (Signed)
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 Continue current BP regimen Carvedilol 25mg  BID (re order), Telmisartan-HCTZ 80-25mg  (HALF tab) daily 2. Encourage improved lifestyle - low sodium diet, regular exercise 3. Continue monitor BP outside office, bring readings to next visit, if persistently >140/90 or new symptoms notify office sooner

## 2021-01-19 NOTE — Assessment & Plan Note (Signed)
Abnormal weight, BMI >38 weight down Encourage lifestyle diet exercise

## 2021-01-19 NOTE — Assessment & Plan Note (Signed)
Controlled cholesterol on statin  lifestyle Last lipid panel 01/2021 CAD, PAD  Plan: 1. Continue current meds - Atorvastatin 20mg  daily 2. On ASA 325 3 Encourage improved lifestyle - low carb/cholesterol, reduce portion size, continue improving regular exercise

## 2021-01-19 NOTE — Progress Notes (Signed)
Subjective:    Patient ID: [name], male    DOB: 07/05/1945, 76 y.o.   MRN: [id number]  [name] is a 76 y.o. male presenting on 01/19/2021 for Annual Exam   HPI  Here for Annual Physical and Lab Review.  Specialists Nephrology - Dr Murlean Iba ([place]) Endocrinology - Dr Mee Hives [facility] Endocrinology) Cardiology - Dr Serafina Royals [facility] Cardiology)   CHRONIC DM, Type 2: Chronic problem.  Followed by [facility] Endocrinology He was taken off Metformin by Nephrology. Improved sugars down to 6 range, now recent result 7.3 He admits erratic blood sugars with some low readings hypoglycemia due to reduced kidney function History of pancreatitis in past, he was not determined to be good candidate for GLP1 therapy, also has history of reduced / fluctuating renal function, not on SGLT2. Meds: Toujeo insulin basal 70 units, Lispro 20 u wc and SSI, Glyburide. - He has REDUCED his insulin dosages by about half often due to low sugars, not consistent Reports  good compliance. Tolerating well w/o side-effects Currently on ARB [facility]) 10/18/19 Admits episodic rare but severe hypoglycemia Denies polyuria, visual changes, numbness or tingling.   HTN CKD III CAD / S/p CABG x5 in 2001 [facility] Cardiology Dr Nehemiah Massed Last pharmacological stress, done 04/2018 showed no ischemia, ECHO had normal LVEF On Carvedilol 25mg  BID, Telmisartan-HCTZ 80-25mg  daily  Gradual decline since October 2022 followed closely by Dr Candiss Norse for CKD, has had 4 labs tested in past 3-4 months. Taken off Fluid medication Torsemide, now PRN, and he developed swelling. Takes it PRN in past 2 weeks. Torsemide per Nephro/Cards Also has been limited on blood pressure medications He has only consumed water since then, no more pepsi soda, tea or other drinks.  history Gout flare R foot pain and swelling years ago, prior history 8.4 uric  acid with flare Recent result with Uric Acid 8.0 01/2021, he is on Allopurinol 100mg  daily, max dose for renal   GERD Chronic problem On esomeprazole 20mg  daily   Candidal Intertrigo Reports skin rash in groin present for long time over past few years episodic flares worse in summer with heat and sweating triggers rash. Also has some dry skin dermatitis Re order [place] Maintenance  Last colonoscopy 03/2009. Now at age 79 he declines further colonoscopy or cologuard. He prefers to not know.   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   by MRi, Juengel   CAD (coronary artery disease)    multivessel diffuse disease   Chronic kidney disease    Hearing loss in right ear    History of pancreatitis 1996   idiopathic   HLD (hyperlipidemia)    Hoarseness    HTN (hypertension)    Past Surgical History:  Procedure Laterality Date   BACK SURGERY     BASAL CELL CARCINOMA EXCISION  2013   removed from right side of face   CARPAL TUNNEL RELEASE     CORONARY ANGIOPLASTY WITH STENT PLACEMENT     CORONARY ARTERY BYPASS GRAFT  NOV 2007   x5   cyst removal of right hand     ruptured disk surgery     neck    Social History   Socioeconomic History   Marital status: Married    Spouse name: Not on file   Number of children: Not on file   Years of education: Not on file   Highest education level: Not on file  Occupational History   Not on file   Tobacco Use   Smoking status: Never   Smokeless tobacco: Never   Tobacco comments:    tobacco use- no   Substance and Sexual Activity   Alcohol use: No   Drug use: No   Sexual activity: Not on file  Other Topics Concern   Not on file  Social History Narrative   Does not regularly exercise. Works part time at [employer] 2-3 hours at night.    Social Determinants of Health   Financial Resource Strain: Not on file  Food Insecurity: Not on file  Transportation Needs: Not on file  Physical Activity: Not on file  Stress: Not on file  Social Connections: Not on file  Intimate Partner Violence: Not on file   Family History  Problem Relation Age of Onset   Heart attack Father    Heart disease Mother    Hyperlipidemia Mother    Hypertension Mother    Heart failure Mother    Coronary artery disease Brother    Crohn's disease Brother    Melanoma Brother    Coronary artery disease Other    Diabetes Other    Cancer Other    Colon cancer Sister 31   Current Outpatient Medications on File Prior to Visit  Medication Sig   allopurinol (ZYLOPRIM) 100 MG tablet TAKE 1 TABLET DAILY   aspirin 325 MG tablet Take 650 mg by mouth daily.   atorvastatin (LIPITOR) 20 MG tablet TAKE 1 TABLET AT BEDTIME   BD PEN NEEDLE NANO 2ND GEN 32G X 4 MM MISC    Cholecalciferol 125 MCG (5000 UT) capsule Take by mouth.   colchicine 0.6 MG tablet Take 2 tablets (1.2mg ) by mouth at first sign of gout flare followed by 1 tablet (0.6mg ) after 1 hour. (Max 1.8mg  within 1 hour)   glyBURIDE (DIABETA) 5 MG tablet TAKE 2 TABLETS TWICE A DAY WITH MEALS   hydrocortisone (ANUSOL-HC) 25 MG suppository Place 25 mg rectally as needed.   hydrocortisone valerate cream (WESTCORT) 0.2 % Apply 1 application topically 2 (two) times daily.   ibuprofen (ADVIL,MOTRIN) 200 MG tablet Take 200 mg by mouth every 6 (six) hours as needed.   insulin glargine, 1 Unit Dial, (TOUJEO SOLOSTAR) 300 UNIT/ML Solostar Pen Inject into the skin.    insulin lispro (HUMALOG) 100 UNIT/ML KwikPen See admin instructions.   Insulin Pen Needle (BD PEN NEEDLE NANO 2ND GEN) 32G X 4 MM MISC 4 shots per day   ketoconazole (NIZORAL) 2 % cream Apply to the face qhs M/W/F   Lancets (ACCU-CHEK SOFT TOUCH) lancets Use as instructed   meclizine (ANTIVERT) 25 MG tablet Take 1 tablet (25 mg total) by mouth 3 (three) times daily as needed.   Multiple Vitamin (MULTI-VITAMIN) tablet Take 1 tablet by  mouth daily.   tamsulosin (FLOMAX) 0.4 MG CAPS capsule Take 1 capsule (0.4 mg total) by mouth daily after breakfast.   torsemide (DEMADEX) 20 MG tablet Take 20 mg by mouth daily as needed.   carvedilol (COREG) 25 MG tablet Take 0.5 tablets (12.5 mg total) by mouth 2 (two) times daily as needed.   telmisartan-hydrochlorothiazide (MICARDIS HCT) 80-25 MG tablet Take 0.5 tablets by mouth daily.   No current facility-administered medications on file prior to visit.    Review of Systems  Constitutional:  Negative for activity change, appetite change, chills, diaphoresis, fatigue and fever.  HENT:  Negative for congestion and hearing loss.   Eyes:  Negative for visual disturbance.  Respiratory:  Negative for cough, chest tightness, shortness of breath and wheezing.   Cardiovascular:  Negative for chest pain, palpitations and leg swelling.  Gastrointestinal:  Negative for abdominal pain, constipation, diarrhea, nausea and vomiting.  Genitourinary:  Negative for dysuria, frequency and hematuria.  Musculoskeletal:  Negative for arthralgias and neck pain.  Skin:  Negative for rash.  Neurological:  Negative for dizziness, weakness, light-headedness, numbness and headaches.  Hematological:  Negative for adenopathy.  Psychiatric/Behavioral:  Negative for behavioral problems, dysphoric mood and sleep disturbance.   Per HPI unless specifically indicated above     Objective:    BP (!) 137/56    Pulse 83    Ht 5' 6.5" (1.689 m)    Wt 244 lb 6.4 oz (110.9 kg)    SpO2 99%     BMI 38.86 kg/m   Wt Readings from Last 3 Encounters:  01/19/21 244 lb 6.4 oz (110.9 kg)  07/13/20 251 lb 6.4 oz (114 kg)  01/13/20 254 lb (115.2 kg)    Physical Exam Vitals and nursing note reviewed.  Constitutional:      General: He is not in acute distress.    Appearance: He is well-developed. He is not diaphoretic.     Comments: Well-appearing, comfortable, cooperative  HENT:     Head: Normocephalic and atraumatic.  Eyes:     General:        Right eye: No discharge.        Left eye: No discharge.     Conjunctiva/sclera: Conjunctivae normal.     Pupils: Pupils are equal, round, and reactive to light.  Neck:     Thyroid: No thyromegaly.  Cardiovascular:     Rate and Rhythm: Normal rate and regular rhythm.     Pulses: Normal pulses.     Heart sounds: Normal heart sounds. No murmur heard. Pulmonary:     Effort: Pulmonary effort is normal. No respiratory distress.     Breath sounds: Normal breath sounds. No wheezing or rales.  Abdominal:     General: Bowel sounds are normal. There is no distension.     Palpations: Abdomen is soft. There is no mass.     Tenderness: There is no abdominal tenderness.  Musculoskeletal:        General: No tenderness. Normal range of motion.     Cervical back: Normal range of motion and neck supple.     Right lower leg: No edema.     Left lower leg: No edema.     Comments: Upper / Lower Extremities: - Normal muscle tone, strength bilateral upper extremities 5/5, lower extremities 5/5  Lymphadenopathy:     Cervical: No cervical adenopathy.  Skin:    General: Skin is warm and dry.     Findings: No erythema or rash.  Neurological:  Mental Status: He is alert and oriented to person, place, and time.     Comments: Distal sensation intact to light touch all extremities  Psychiatric:        Mood and Affect: Mood normal.        Behavior: Behavior normal.        Thought Content: Thought content normal.     Comments: Well groomed, good eye  contact, normal speech and thoughts    Recent Labs    03/16/20 0000 01/12/21 0809  HGBA1C 7.0 7.3*   Lipid Panel     Component Value Date/Time   CHOL 155 01/12/2021 0809   TRIG 364 (H) 01/12/2021 0809   HDL 36 (L) 01/12/2021 0809   CHOLHDL 4.3 01/12/2021 0809   VLDL 69.2 (H) 01/17/2012 1000   LDLCALC 75 01/12/2021 0809   LDLDIRECT 114.4 01/17/2012 1000     Chemistry      Component Value Date/Time   NA 141 01/12/2021 0809   NA 138 04/01/2013 0453   K 3.9 01/12/2021 0809   K 3.5 04/01/2013 0453   CL 98 01/12/2021 0809   CL 106 04/01/2013 0453   CO2 31 01/12/2021 0809   CO2 26 04/01/2013 0453   BUN 63 (H) 01/12/2021 0809   BUN 30 (H) 04/01/2013 0453   CREATININE 2.23 (H) 01/12/2021 0809      Component Value Date/Time   CALCIUM 9.3 01/12/2021 0809   CALCIUM 8.1 (L) 04/01/2013 0453   ALKPHOS 74 03/30/2013 0108   AST 16 01/12/2021 0809   AST 13 (L) 03/30/2013 0108   ALT 17 01/12/2021 0809   ALT 25 03/30/2013 0108   BILITOT 0.3 01/12/2021 0809   BILITOT 0.6 03/30/2013 0108        Results for orders placed or performed in visit on 01/19/21  Microalbumin, urine  Result Value Ref Range   Microalb, Ur 98       Assessment & Plan:   Problem List Items Addressed This Visit     Type 2 diabetes mellitus with diabetic nephropathy ([place])    Well-controlled DM with A1c 7.3 improved control on insulin regimen per Endocrinology Complications - CKD III, Hyperlipidemia, GERD, obesity, CAD -  increases risk of future cardiovascular complications   Complication HYPOGLYCEMIA with decline in renal function  Off Metformin due to CKD  Plan:  1. Continue current therapy -  Toujeo, Lispro, Glyburide per Endocrinology - however my recommendation today is to HOLD Glyburide oral sulfonylurea right now due to risk of hypoglycemia. He should discuss directly with his Endocrinology to manage this further with insulin regimen, he can reduce dosing as indicated for hypoglycemia, we  discussed risks of this and hypoglycemic strategy.  2. Encourage improved lifestyle - low carb, low sugar diet, reduce portion size, continue improving regular exercise 3. Check CBG , bring log to next visit for review 4. Continue ASA,  ARB, Statin      Morbid obesity (HCC)    Abnormal weight, BMI >38 weight down Encourage lifestyle diet exercise      Hyperlipidemia associated with type 2 diabetes mellitus ([place])    Controlled cholesterol on statin  lifestyle Last lipid panel 01/2021 CAD, PAD  Plan: 1. Continue current meds - Atorvastatin 20mg  daily 2. On ASA 325 3 Encourage improved lifestyle - low carb/cholesterol, reduce portion size, continue improving regular exercise      Relevant Medications   carvedilol (COREG) 25 MG tablet   Esophageal reflux   Relevant Medications   esomeprazole (NEXIUM)  20 MG capsule   Benign hypertension with CKD (chronic kidney disease) stage III (HCC)    Well-controlled HTN Complication with 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 Continue current BP regimen Carvedilol 25mg  BID (re order), Telmisartan-HCTZ 80-25mg  (HALF tab) daily 2. Encourage improved lifestyle - low sodium diet, regular exercise 3. Continue monitor BP outside office, bring readings to next visit, if persistently >140/90 or new symptoms notify office sooner      Relevant Medications   carvedilol (COREG) 25 MG tablet   Other Visit Diagnoses     Annual physical exam    -  Primary   Intertrigo       Relevant Medications   fluconazole (DIFLUCAN) 200 MG tablet (Start on 01/21/2021)       Updated Health Maintenance information Reviewed recent lab results with patient Encouraged improvement to lifestyle with diet and exercise Goal of weight loss  GERD He will take OTC Esomeprazole 20mg  daily. 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 Take 1 po on Monday and Thursday, do not take colchicine on days taking fluconazole    Dispense:  8 tablet    Refill:  1   esomeprazole (NEXIUM) 20 MG capsule    Sig: Take 1 capsule (20 mg total) by mouth daily before breakfast.    Dispense:  90 capsule    Refill:  3     Follow up plan: Return in about 3 months (around 04/19/2021) for 3 month follow-up DM A1c, CKD updates.  Nobie Putnam, [facility] 01/19/2021, 9:22 AM

## 2021-01-19 NOTE — Patient Instructions (Addendum)
Thank you for coming to the office today.  I am very concerned about the low sugars, your kidneys are not clearing the insulin out of body.   I would recommend HOLDING or stopping Glyburide and limiting the Insulin dosing. Caution if low sugar.  Please talk with Dr Honor Junes  Your anemia is mild, it is likely caused by some mild reduced kidney function, they don't procedure EPO Erythropoetin.   Please schedule a Follow-up Appointment to: Return in about 3 months (around 04/19/2021) for 3 month follow-up DM A1c, CKD updates.  If you have any other questions or concerns, please feel free to call the office or send a message through [place]. You may also schedule an earlier appointment if necessary.  Additionally, you may be receiving a survey about your experience at our office within a few days to 1 week by e-mail or mail. We value your feedback.  Nobie Putnam, DO [place]

## 2021-03-24 ENCOUNTER — Other Ambulatory Visit: Payer: Self-pay | Admitting: Family Medicine

## 2021-03-24 DIAGNOSIS — E1169 Type 2 diabetes mellitus with other specified complication: Secondary | ICD-10-CM

## 2021-03-26 LAB — HM DIABETES EYE EXAM

## 2021-03-26 NOTE — Telephone Encounter (Signed)
Requested Prescriptions  ?Pending Prescriptions Disp Refills  ?? atorvastatin (LIPITOR) 20 MG tablet [Pharmacy Med Name: ATORVASTATIN TAB '20MG'$ ] 90 tablet 0  ?  Sig: TAKE 1 TABLET AT BEDTIME  ?  ? Cardiovascular:  Antilipid - Statins Failed - 03/24/2021  9:01 PM  ?  ?  Failed - Lipid Panel in normal range within the last 12 months  ?  Cholesterol  ?Date Value Ref Range Status  ?01/12/2021 155 <200 mg/dL Final  ? ?LDL Cholesterol (Calc)  ?Date Value Ref Range Status  ?01/12/2021 75 mg/dL (calc) Final  ?  Comment:  ?  Reference range: <100 ?Marland Kitchen ?Desirable range <100 mg/dL for primary prevention;   ?<70 mg/dL for patients with CHD or diabetic patients  ?with > or = 2 CHD risk factors. ?. ?LDL-C is now calculated using the Martin-Hopkins  ?calculation, which is a validated novel method providing  ?better accuracy than the Friedewald equation in the  ?estimation of LDL-C.  ?Cresenciano Genre et al. Annamaria Helling. 3335;456(25): 2061-2068  ?(http://education.[employer].com/faq/FAQ164) ?  ? ?Direct LDL  ?Date Value Ref Range Status  ?01/17/2012 114.4 mg/dL Final  ?  Comment:  ?  Optimal:  <100 mg/dLNear or Above Optimal:  100-129 mg/dLBorderline High:  130-159 mg/dLHigh:  160-189 mg/dLVery High:  >190 mg/dL  ? ?HDL  ?Date Value Ref Range Status  ?01/12/2021 36 (L) > OR = 40 mg/dL Final  ? ?Triglycerides  ?Date Value Ref Range Status  ?01/12/2021 364 (H) <150 mg/dL Final  ?  Comment:  ?  . ?If a non-fasting specimen was collected, consider ?repeat triglyceride testing on a fasting specimen ?if clinically indicated.  ?Garald Balding al. J. of Clin. Lipidol. 6389;3:734-287. ?. ?  ? ?  ?  ?  Passed - Patient is not pregnant  ?  ?  Passed - Valid encounter within last 12 months  ?  Recent Outpatient Visits   ?      ? 2 months ago Annual physical exam  ? [place], DO  ? 8 months ago Type 2 diabetes mellitus with diabetic nephropathy, with long-term current use of insulin ([place])  ? [place], DO  ? 1 year ago Annual physical exam  ? [place], DO  ? 1 year ago Type 2 diabetes mellitus with diabetic nephropathy, with long-term current use of insulin ([place])  ? [place], DO  ?  ?  ?Future Appointments   ?        ? In 1 month Karamalegos, Devonne Doughty, DO [facility] [place]  ? In 2 months Ralene Bathe, MD [place]  ?  ? ?  ?  ?  ? ?

## 2021-04-26 ENCOUNTER — Encounter: Payer: Self-pay | Admitting: Family Medicine

## 2021-04-26 ENCOUNTER — Ambulatory Visit: Payer: Federal, State, Local not specified - PPO | Admitting: Family Medicine

## 2021-04-26 VITALS — BP 128/62 | HR 86 | Ht 66.5 in | Wt 254.0 lb

## 2021-04-26 DIAGNOSIS — I6523 Occlusion and stenosis of bilateral carotid arteries: Secondary | ICD-10-CM

## 2021-04-26 DIAGNOSIS — E1121 Type 2 diabetes mellitus with diabetic nephropathy: Secondary | ICD-10-CM

## 2021-04-26 DIAGNOSIS — Z794 Long term (current) use of insulin: Secondary | ICD-10-CM

## 2021-04-26 DIAGNOSIS — N183 Chronic kidney disease, stage 3 unspecified: Secondary | ICD-10-CM

## 2021-04-26 DIAGNOSIS — I25810 Atherosclerosis of coronary artery bypass graft(s) without angina pectoris: Secondary | ICD-10-CM | POA: Diagnosis not present

## 2021-04-26 DIAGNOSIS — I129 Hypertensive chronic kidney disease with stage 1 through stage 4 chronic kidney disease, or unspecified chronic kidney disease: Secondary | ICD-10-CM

## 2021-04-26 NOTE — Progress Notes (Signed)
? ?Subjective:  ? ? Patient ID: [name], male    DOB: 11/07/1945, 76 y.o.   MRN: [id number] ? ?[name] is a 76 y.o. male presenting on 04/26/2021 for Diabetes and Chronic Kidney Disease ? ? ?HPI ? ?Specialists ?Nephrology - Dr Murlean Iba ([place]) ?Endocrinology - Dr Mee Hives [facility]) ?Cardiology - Dr Serafina Royals [facility]) ?  ?CHRONIC DM, Type 2: ?Chronic problem.  ?Followed by [facility] ?He was taken off Metformin by Nephrology. ?Most recent lab A1c 7.3 in 01/2021 ?He admits erratic blood sugars with some low readings hypoglycemia due to reduced kidney function ?Nephrology started him on Jardiance, for kidneys and he said sugar went up. He has been off Jardiance for past 1 month. ?He has discontinued Sodas and has had some improvement in CKD results ?History of pancreatitis in past, he was not determined to be good candidate for GLP1 therapy, also has history of reduced / fluctuating renal function, not on SGLT2. ?Meds: Tyler Aas insulin basal 80 units, Lispro 20 u wc and SSI, Glyburide '5mg'$  x 2 BID with meals. ?Reports  good compliance. Tolerating well w/o side-effects ?Currently on ARB ?Has [facility]) ?Admits episodic rare but severe hypoglycemia ?Denies polyuria, visual changes, numbness or tingling ? ?HTN CKD III ?CAD / S/p CABG x5 in 2001 ?Right Carotid Stenosis 50% ?[facility] Cardiology Dr Nehemiah Massed ?Last pharmacological stress, done 04/2018 showed no ischemia, ECHO had normal LVEF ?On Carvedilol '25mg'$  BID, Telmisartan-HCTZ 80-'25mg'$  daily ?  ?Gradual decline since October 2022 followed closely by Dr Candiss Norse for CKD, has had 4 labs tested in past 3-4 months. ?Taken off Fluid medication Torsemide, now PRN, and he developed swelling. Takes it PRN in past 2 weeks. Torsemide per Nephro/Cards ?Also has been limited on blood pressure medications ?He has only consumed water since then, no more pepsi soda, tea  or other drinks. ? ?Bilateral Shoulder pain ?Reports chronic issue for few years, with worsening R>L shoulder pain worse with lifting activity. He has years of repetitive activity. ?Taking Aspirin '325mg'$  daily. ? ?  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  ?GERD ?Chronic problem ?On esomeprazole '20mg'$  daily ?  ? ? ?  01/19/2021  ?  9:01 AM 01/13/2020  ?  9:03 AM 09/13/2019  ? 10:24 AM  ?Depression screen PHQ 2/9  ?Decreased Interest 0 0 0  ?Down, Depressed, Hopeless 0 0 0  ?PHQ - 2 Score 0 0 0  ?Altered sleeping 3    ?Tired, decreased energy 1    ?Change in appetite 0    ?Feeling bad or failure about yourself  0    ?Trouble concentrating 0    ?Moving slowly or fidgety/restless 0    ?Suicidal thoughts 0    ?PHQ-9 Score 4    ?Difficult doing work/chores Not difficult at all    ? ? ?Social History  ? ?Tobacco Use  ? Smoking status: Never  ? Smokeless tobacco: Never  ? Tobacco comments:  ?  tobacco use- no   ?Substance Use Topics  ? Alcohol use: No  ? Drug use: No  ? ? ?Review of Systems ?Per HPI unless specifically indicated above ? ?   ?Objective:  ?  ?BP 128/62   Pulse 86   Ht 5' 6.5" (1.689 m)  Wt 254 lb (115.2 kg)   SpO2 100%   BMI 40.38 kg/m?   ?Wt Readings from Last 3 Encounters:  ?04/26/21 254 lb (115.2 kg)  ?01/19/21 244 lb 6.4 oz (110.9 kg)  ?07/13/20 251 lb 6.4 oz (114 kg)  ?  ?Physical Exam ?Vitals and nursing note reviewed.  ?Constitutional:   ?   General: He is not in acute distress. ?   Appearance: He is well-developed. He is obese. He is not diaphoretic.  ?   Comments: Well-appearing, comfortable, cooperative  ?HENT:  ?   Head: Normocephalic and atraumatic.  ?Eyes:  ?   General:     ?   Right eye: No discharge.     ?   Left eye: No discharge.  ?   Conjunctiva/sclera: Conjunctivae normal.  ?Neck:  ?   Thyroid: No thyromegaly.  ?Cardiovascular:  ?   Rate  and Rhythm: Normal rate and regular rhythm.  ?   Pulses: Normal pulses.  ?   Heart sounds: Normal heart sounds. No murmur heard. ?Pulmonary:  ?   Effort: Pulmonary effort is normal. No respiratory distress.  ?   Breath sounds: Normal breath sounds. No wheezing or rales.  ?Musculoskeletal:     ?   General: Normal range of motion.  ?   Cervical back: Normal range of motion and neck supple.  ?   Right lower leg: No edema.  ?   Left lower leg: No edema.  ?Lymphadenopathy:  ?   Cervical: No cervical adenopathy.  ?Skin: ?   General: Skin is warm and dry.  ?   Findings: No erythema or rash.  ?Neurological:  ?   Mental Status: He is alert and oriented to person, place, and time. Mental status is at baseline.  ?Psychiatric:     ?   Behavior: Behavior normal.  ?   Comments: Well groomed, good eye contact, normal speech and thoughts  ? ? ?Diabetic Foot Exam - Simple   ?Simple Foot Form ?Diabetic Foot exam was performed with the following findings: Yes 04/26/2021  8:49 AM  ?Visual Inspection ?See comments: Yes ?Sensation Testing ?Intact to touch and monofilament testing bilaterally: Yes ?Pulse Check ?Posterior Tibialis and Dorsalis pulse intact bilaterally: Yes ?Comments ?Callus formation, no ulceration. Intact monofilament. ?  ? ?Recent Labs  ?  01/12/21 ?0809  ?HGBA1C 7.3*  ? ? ?Results for orders placed or performed in visit on 01/19/21  ?Microalbumin, urine  ?Result Value Ref Range  ? Microalb, Ur 98   ? ?   ?Assessment & Plan:  ? ?Problem List Items Addressed This Visit   ? ? Type 2 diabetes mellitus with diabetic nephropathy ([place]) - Primary  ?  Well-controlled DM with A1c 7.3 ?Due for repeat lab A1c ?improved control on insulin regimen per Endocrinology ?Complications - CKD III, Hyperlipidemia, GERD, obesity, CAD -  increases risk of future cardiovascular complications  ? ?Complication HYPOGLYCEMIA with decline in renal function ? ?Off Metformin due to CKD ? ?Plan:  ?1. Continue current therapy -  Toujeo, Lispro, Glyburide  per Endocrinology - advised use CAUTION with Glyburide avoid hypoglycemia ?2. Encourage improved lifestyle - low carb, low sugar diet, reduce portion size, continue improving regular exercise ?3. Check CBG , bring log to next visit for review ?4. Continue ASA,  ARB, Statin ?  ?  ? Relevant Medications  ? insulin degludec (TRESIBA) 200 UNIT/ML FlexTouch Pen  ? Other Relevant Orders  ? Hemoglobin A1c  ? Occlusion and stenosis of bilateral carotid  arteries  ? CAD, ARTERY BYPASS GRAFT  ?  Followed by [facility] Cardiology Dr Nehemiah Massed ?S/p CABG x 5 in 2001 ?Has had CAD of bypass graft ?Last stress test / ECHO 2020 ?On med management ASA, Coreg, ARB, Atorvastatin ? ?  ?  ? Benign hypertension with CKD (chronic kidney disease) stage III (HCC)  ?  Well-controlled HTN ?Complication with CKDIIIb to 4 range, CAD, edema ?Followed by Nephrology ? ?  ?Plan:  ?1. Continue current BP regimen Carvedilol '25mg'$  BID (re order), Telmisartan-HCTZ 80-'25mg'$  (HALF tab) daily ?- Torsemide PRN ?2. Encourage improved lifestyle - low sodium diet, regular exercise ?3. Continue monitor BP outside office, bring readings to next visit, if persistently >140/90 or new symptoms notify office sooner ? ?  ?  ? ? ?  ?No orders of the defined types were placed in this encounter. ? ? ?Follow up plan: ?Return in about 6 months (around 10/26/2021) for 6 month follow-up DM A1c, HTN CKD, Shoulder Pain/Rotator Cuff. ? ? ?Nobie Putnam, DO ?[facility] ?[facility] ?04/26/2021, 8:31 AM ?

## 2021-04-26 NOTE — Assessment & Plan Note (Signed)
Well-controlled HTN ?Complication with CKDIIIb to 4 range, CAD, edema ?Followed by Nephrology ? ?  ?Plan:  ?1. Continue current BP regimen Carvedilol '25mg'$  BID (re order), Telmisartan-HCTZ 80-'25mg'$  (HALF tab) daily ?- Torsemide PRN ?2. Encourage improved lifestyle - low sodium diet, regular exercise ?3. Continue monitor BP outside office, bring readings to next visit, if persistently >140/90 or new symptoms notify office sooner ?

## 2021-04-26 NOTE — Assessment & Plan Note (Signed)
Followed by [facility] Cardiology Dr Nehemiah Massed ?S/p CABG x 5 in 2001 ?Has had CAD of bypass graft ?Last stress test / ECHO 2020 ?On med management ASA, Coreg, ARB, Atorvastatin ?

## 2021-04-26 NOTE — Assessment & Plan Note (Signed)
Well-controlled DM with A1c 7.3 ?Due for repeat lab A1c ?improved control on insulin regimen per Endocrinology ?Complications - CKD III, Hyperlipidemia, GERD, obesity, CAD -  increases risk of future cardiovascular complications  ? ?Complication HYPOGLYCEMIA with decline in renal function ? ?Off Metformin due to CKD ? ?Plan:  ?1. Continue current therapy -  Toujeo, Lispro, Glyburide per Endocrinology - advised use CAUTION with Glyburide avoid hypoglycemia ?2. Encourage improved lifestyle - low carb, low sugar diet, reduce portion size, continue improving regular exercise ?3. Check CBG , bring log to next visit for review ?4. Continue ASA,  ARB, Statin ?

## 2021-04-26 NOTE — Patient Instructions (Addendum)
Thank you for coming to the office today. ? ?Shoulder Pain / Rotator Cuff ? ?START anti inflammatory topical - OTC Voltaren (generic Diclofenac) topical 2-4 times a day as needed for pain swelling of affected joint for 1-2 weeks or longer. ? ?Recommend to start taking Tylenol Extra Strength '500mg'$  tabs - take 1 to 2 tabs per dose (max '1000mg'$ ) every 6-8 hours for pain (take regularly, don't skip a dose for next 7 days), max 24 hour daily dose is 6 tablets or '3000mg'$ . In the future you can repeat the same everyday Tylenol course for 1-2 weeks at a time.  ? ?A1c today. ? ?Please schedule a Follow-up Appointment to: Return in about 6 months (around 10/26/2021) for 6 month follow-up DM A1c, HTN CKD, Shoulder Pain/Rotator Cuff. ? ?If you have any other questions or concerns, please feel free to call the office or send a message through [place]. You may also schedule an earlier appointment if necessary. ? ?Additionally, you may be receiving a survey about your experience at our office within a few days to 1 week by e-mail or mail. We value your feedback. ? ?Nobie Putnam, DO ?[facility] ?

## 2021-04-27 LAB — HEMOGLOBIN A1C
Hgb A1c MFr Bld: 8 % of total Hgb — ABNORMAL HIGH (ref <5.7)
Mean Plasma Glucose: 183 mg/dL
eAG (mmol/L): 10.1 mmol/L

## 2021-05-04 ENCOUNTER — Encounter: Payer: Self-pay | Admitting: Family Medicine

## 2021-06-02 ENCOUNTER — Ambulatory Visit: Payer: Federal, State, Local not specified - PPO | Admitting: Dermatology

## 2021-06-02 DIAGNOSIS — L219 Seborrheic dermatitis, unspecified: Secondary | ICD-10-CM | POA: Diagnosis not present

## 2021-06-02 DIAGNOSIS — L814 Other melanin hyperpigmentation: Secondary | ICD-10-CM

## 2021-06-02 DIAGNOSIS — L820 Inflamed seborrheic keratosis: Secondary | ICD-10-CM | POA: Diagnosis not present

## 2021-06-02 DIAGNOSIS — L304 Erythema intertrigo: Secondary | ICD-10-CM

## 2021-06-02 DIAGNOSIS — D1800 Hemangioma unspecified site: Secondary | ICD-10-CM

## 2021-06-02 DIAGNOSIS — D229 Melanocytic nevi, unspecified: Secondary | ICD-10-CM

## 2021-06-02 DIAGNOSIS — Z85828 Personal history of other malignant neoplasm of skin: Secondary | ICD-10-CM

## 2021-06-02 DIAGNOSIS — R600 Localized edema: Secondary | ICD-10-CM

## 2021-06-02 DIAGNOSIS — Z1283 Encounter for screening for malignant neoplasm of skin: Secondary | ICD-10-CM

## 2021-06-02 DIAGNOSIS — L918 Other hypertrophic disorders of the skin: Secondary | ICD-10-CM

## 2021-06-02 DIAGNOSIS — L821 Other seborrheic keratosis: Secondary | ICD-10-CM

## 2021-06-02 DIAGNOSIS — L578 Other skin changes due to chronic exposure to nonionizing radiation: Secondary | ICD-10-CM

## 2021-06-02 MED ORDER — KETOCONAZOLE 2 % EX CREA: TOPICAL_CREAM | CUTANEOUS | 11 refills | Status: DC

## 2021-06-02 NOTE — Patient Instructions (Addendum)
Instructions for [facility] Medications Medication for groin  One or more of your medications was sent to the [facility] mail order compounding pharmacy. You will receive an email from them and can purchase the medicine through that link. It will then be mailed to your home at the address you confirmed. If for any reason you do not receive an email from them, please check your spam folder. If you still do not find the email, please let us know. [facility] phone number is [phone].       Cryotherapy Aftercare  Wash gently with soap and water everyday.   Apply Vaseline and Band-Aid daily until healed.    If You Need Anything After Your Visit  If you have any questions or concerns for your doctor, please call our main line at [phone] and press option 4 to reach your doctor's medical assistant. If no one answers, please leave a voicemail as directed and we will return your call as soon as possible. Messages left after 4 pm will be answered the following business day.   You may also send [place] a message via [place]. We typically respond to MyChart messages within 1-2 business days.  For prescription refills, please ask your pharmacy to contact our office. Our fax number is [phone].  If you have an urgent issue when the clinic is closed that cannot wait until the next business day, you can page your doctor at the number below.    Please note that while we do our best to be available for urgent issues outside of office hours, we are not available 24/7.   If you have an urgent issue and are unable to reach [place], you may choose to seek medical care at your doctor's office, retail clinic, urgent care center, or emergency room.  If you have a medical emergency, please immediately call 911 or go to the emergency department.  Pager Numbers  - Dr. Nehemiah Massed: [phone]  - Dr. Laurence Ferrari: [phone]  - Dr. Nicole Kindred: [phone]  In the event of inclement weather, please  call our main line at [phone] for an update on the status of any delays or closures.  Dermatology Medication Tips: Please keep the boxes that topical medications come in in order to help keep track of the instructions about where and how to use these. Pharmacies typically print the medication instructions only on the boxes and not directly on the medication tubes.   If your medication is too expensive, please contact our office at [phone] option 4 or send [place] a message through [place].   We are unable to tell what your co-pay for medications will be in advance as this is different depending on your insurance coverage. However, we may be able to find a substitute medication at lower cost or fill out paperwork to get insurance to cover a needed medication.   If a prior authorization is required to get your medication covered by your insurance company, please allow [place] 1-2 business days to complete this process.  Drug prices often vary depending on where the prescription is filled and some pharmacies may offer cheaper prices.  The website www.goodrx.com contains coupons for medications through different pharmacies. The prices here do not account for what the cost may be with help from insurance (it may be cheaper with your insurance), but the website can give you the price if you did not use any insurance.  - You can print the associated coupon and take it with your prescription to the  pharmacy.  - You may also stop by our office during regular business hours and pick up a GoodRx coupon card.  - If you need your prescription sent electronically to a different pharmacy, notify our office through [facility] or by phone at [phone] option 4.     Si Usted Necesita Algo Despus de Su Visita  Tambin puede enviarnos un mensaje a travs de Pharmacist, community. Por lo general respondemos a los mensajes de MyChart en el transcurso de 1 a 2 das hbiles.  Para renovar recetas, por favor pida a  su farmacia que se ponga en contacto con nuestra oficina. Harland Dingwall de fax es [place] [phone].  Si tiene un asunto urgente cuando la clnica est cerrada y que no puede esperar hasta el siguiente da hbil, puede llamar/localizar a su doctor(a) al nmero que aparece a continuacin.   Por favor, tenga en cuenta que aunque hacemos todo lo posible para estar disponibles para asuntos urgentes fuera del horario de [place], no estamos disponibles las 24 horas del da, los 7 das de la [place].   Si tiene un problema urgente y no puede comunicarse con nosotros, puede optar por buscar atencin mdica  en el consultorio de su doctor(a), en una clnica privada, en un centro de atencin urgente o en una sala de emergencias.  Si tiene Engineering geologist, por favor llame inmediatamente al 911 o vaya a la sala de emergencias.  Nmeros de bper  - Dr. Nehemiah Massed: [phone]  - Dra. Moye: [phone]  - Dra. Nicole Kindred: [phone]  En caso de inclemencias del [place], por favor llame a Johnsie Kindred principal al [phone] para una actualizacin sobre el [place] de cualquier retraso o cierre.  Consejos para la medicacin en dermatologa: Por favor, guarde las cajas en las que vienen los medicamentos de uso tpico para ayudarle a seguir las instrucciones sobre dnde y cmo usarlos. Las farmacias generalmente imprimen las instrucciones del medicamento slo en las cajas y no directamente en los tubos del [place].   Si su medicamento es muy caro, por favor, pngase en contacto con Zigmund Daniel llamando al [phone] y presione la opcin 4 o envenos un mensaje a travs de Pharmacist, community.   No podemos decirle cul ser su copago por los medicamentos por adelantado ya que esto es diferente dependiendo de la cobertura de su seguro. Sin embargo, es posible que podamos encontrar un medicamento sustituto a Electrical engineer un formulario para que el seguro cubra el medicamento que se considera necesario.    Si se requiere una autorizacin previa para que su compaa de seguros [place] su medicamento, por favor permtanos de 1 a 2 das hbiles para completar este proceso.  Los precios de los medicamentos varan con frecuencia dependiendo del Environmental consultant de dnde se surte la receta y alguna farmacias pueden ofrecer precios ms baratos.  El sitio web www.goodrx.com tiene cupones para medicamentos de Airline pilot. Los precios aqu no tienen en cuenta lo que podra costar con la ayuda del seguro (puede ser ms barato con su seguro), pero el sitio web puede darle el precio si no utiliz Research scientist (physical sciences).  - Puede imprimir el cupn correspondiente y llevarlo con su receta a la farmacia.  - Tambin puede pasar por nuestra oficina durante el horario de atencin regular y Charity fundraiser una tarjeta de cupones de GoodRx.  - Si necesita que su receta se enve electrnicamente a Chiropodist, informe a nuestra oficina a travs de MyChart de [place] o por telfono llamando  al [phone] y presione la opcin 4.

## 2021-06-02 NOTE — Progress Notes (Signed)
Follow-Up Visit   Subjective  [name] is a 76 y.o. male who presents for the following: Annual Exam (TBSE ). Hx of BCC The patient presents for Total-Body Skin Exam (TBSE) for skin cancer screening and mole check.  The patient has spots, moles and lesions to be evaluated, some may be new or changing and the patient has concerns that these could be cancer.   The following portions of the chart were reviewed this encounter and updated as appropriate:   Tobacco  Allergies  Meds  Problems  Med Hx  Surg Hx  Fam Hx     Review of Systems:  No other skin or systemic complaints except as noted in HPI or Assessment and Plan.  Objective  Well appearing patient in no apparent distress; mood and affect are within normal limits.  A full examination was performed including scalp, head, eyes, ears, nose, lips, neck, chest, axillae, abdomen, back, buttocks, bilateral upper extremities, bilateral lower extremities, hands, feet, fingers, toes, fingernails, and toenails. All findings within normal limits unless otherwise noted below.  groin Mild pinkness  Head - Anterior (Face) Mild pinkness  legs, feet Edema   Left Upper Back Stuck-on, waxy, tan-brown papule--Discussed benign etiology and prognosis.    Assessment & Plan  Erythema intertrigo groin Intertrigo is a chronic recurrent rash that occurs in skin fold areas that may be associated with friction; heat; moisture; yeast; fungus; and bacteria.  It is exacerbated by increased movement / activity; sweating; and higher atmospheric temperature.  Chronic and persistent condition with duration or expected duration over one year. Condition is symptomatic/ bothersome to patient. Not currently at goal.   Cont [facility] medicinals intertrigo cream Iodoquinol: 1% Hydrocortisone: 2.5% Niacinamide: 2% Vehicle: Cream  Consider oral antifungal such as Diflucan if needed for flare or persistent issues.  Seborrheic dermatitis Head - Anterior  (Face) Seborrheic Dermatitis  -  is a chronic persistent rash characterized by pinkness and scaling most commonly of the mid face but also can occur on the scalp (dandruff), ears; mid chest, mid back and groin.  It tends to be exacerbated by stress and cooler weather.  People who have neurologic disease may experience new onset or exacerbation of existing seborrheic dermatitis.  The condition is not curable but treatable and can be controlled.   Continue Ketoconazole cream qhs  M/W/F   Related Medications ketoconazole (NIZORAL) 2 % cream Apply to the face qhs M/W/F  Edema of both lower legs legs, feet F/u with PCP for edema-legs and feet   Inflamed seborrheic keratosis Left Upper Back Symptomatic, irritating, patient would like treated.   Destruction of lesion - Left Upper Back Complexity: simple   Destruction method: cryotherapy   Informed consent: discussed and consent obtained   Timeout:  patient name, date of birth, surgical site, and procedure verified Lesion destroyed using liquid nitrogen: Yes   Region frozen until ice ball extended beyond lesion: Yes   Outcome: patient tolerated procedure well with no complications   Post-procedure details: wound care instructions given    Lentigines - Scattered tan macules - Due to sun exposure - Benign-appearing, observe - Recommend daily broad spectrum sunscreen SPF 30+ to sun-exposed areas, reapply every 2 hours as needed. - Call for any changes  Seborrheic Keratoses - Stuck-on, waxy, tan-brown papules and/or plaques  - Benign-appearing - Discussed benign etiology and prognosis. - Observe - Call for any changes  Melanocytic Nevi - Tan-brown and/or pink-flesh-colored symmetric macules and papules - Benign appearing on exam  today - Observation - Call clinic for new or changing moles - Recommend daily use of broad spectrum spf 30+ sunscreen to sun-exposed areas.   Hemangiomas - Red papules - Discussed benign nature -  Observe - Call for any changes  Actinic Damage - Chronic condition, secondary to cumulative UV/sun exposure - diffuse scaly erythematous macules with underlying dyspigmentation - Recommend daily broad spectrum sunscreen SPF 30+ to sun-exposed areas, reapply every 2 hours as needed.  - Staying in the shade or wearing long sleeves, sun glasses (UVA+UVB protection) and wide brim hats (4-inch brim around the entire circumference of the hat) are also recommended for sun protection.  - Call for new or changing lesions.  Acrochordons (Skin Tags) - Fleshy, skin-colored pedunculated papules - Benign appearing.  - Observe. - If desired, they can be removed with an in office procedure that is not covered by insurance. - Please call the clinic if you notice any new or changing lesions.   History of Basal Cell Carcinoma of the Skin Right side of face  - No evidence of recurrence today - Recommend regular full body skin exams - Recommend daily broad spectrum sunscreen SPF 30+ to sun-exposed areas, reapply every 2 hours as needed.  - Call if any new or changing lesions are noted between office visits   Skin cancer screening performed today.   Return in about 1 year (around 06/03/2022) for TBSE, hx of BCC .  IMarye Round, CMA, am acting as scribe for [name], MD .  Documentation: I have reviewed the above documentation for accuracy and completeness, and I agree with the above.  [name], MD

## 2021-06-06 ENCOUNTER — Encounter: Payer: Self-pay | Admitting: Dermatology

## 2021-07-01 ENCOUNTER — Other Ambulatory Visit: Payer: Self-pay | Admitting: Family Medicine

## 2021-07-01 DIAGNOSIS — E1169 Type 2 diabetes mellitus with other specified complication: Secondary | ICD-10-CM

## 2021-07-01 NOTE — Telephone Encounter (Signed)
Requested Prescriptions  Pending Prescriptions Disp Refills  . atorvastatin (LIPITOR) 20 MG tablet [Pharmacy Med Name: ATORVASTATIN TAB '20MG'$ ] 90 tablet 0    Sig: TAKE 1 TABLET AT BEDTIME     Cardiovascular:  Antilipid - Statins Failed - 07/01/2021  7:37 AM      Failed - Lipid Panel in normal range within the last 12 months    Cholesterol  Date Value Ref Range Status  01/12/2021 155 <200 mg/dL Final   LDL Cholesterol (Calc)  Date Value Ref Range Status  01/12/2021 75 mg/dL (calc) Final    Comment:    Reference range: <100 . Desirable range <100 mg/dL for primary prevention;   <70 mg/dL for patients with CHD or diabetic patients  with > or = 2 CHD risk factors. Marland Kitchen LDL-C is now calculated using the Martin-Hopkins  calculation, which is a validated novel method providing  better accuracy than the Friedewald equation in the  estimation of LDL-C.  Cresenciano Genre et al. Annamaria Helling. 8101;751(02): 2061-2068  (http://education.[facility].com/faq/FAQ164)    Direct LDL  Date Value Ref Range Status  01/17/2012 114.4 mg/dL Final    Comment:    Optimal:  <100 mg/dLNear or Above Optimal:  100-129 mg/dLBorderline High:  130-159 mg/dLHigh:  160-189 mg/dLVery High:  >190 mg/dL   HDL  Date Value Ref Range Status  01/12/2021 36 (L) > OR = 40 mg/dL Final   Triglycerides  Date Value Ref Range Status  01/12/2021 364 (H) <150 mg/dL Final    Comment:    . If a non-fasting specimen was collected, consider repeat triglyceride testing on a fasting specimen if clinically indicated.  Yates Decamp et al. J. of Clin. Lipidol. 5852;7:782-423. Marland Kitchen          Passed - Patient is not pregnant      Passed - Valid encounter within last 12 months    Recent Outpatient Visits          2 months ago Type 2 diabetes mellitus with diabetic nephropathy, with long-term current use of insulin ([place])   [facility], Devonne Doughty, DO   5 months ago Annual physical exam   [facility] Olin Hauser, DO   11 months ago Type 2 diabetes mellitus with diabetic nephropathy, with long-term current use of insulin ([place])   [place], DO   1 year ago Annual physical exam   [facility] Olin Hauser, DO   1 year ago Type 2 diabetes mellitus with diabetic nephropathy, with long-term current use of insulin ([place])   [facility], Devonne Doughty, DO      Future Appointments            In 3 months Parks Ranger, Devonne Doughty, DO [facility], [place]   In 11 months Ralene Bathe, MD [place]

## 2021-09-02 ENCOUNTER — Other Ambulatory Visit: Payer: Self-pay | Admitting: Family Medicine

## 2021-09-02 DIAGNOSIS — M1A0790 Idiopathic chronic gout, unspecified ankle and foot, without tophus (tophi): Secondary | ICD-10-CM

## 2021-09-03 NOTE — Telephone Encounter (Signed)
Requested medication (s) are due for refill today - expired Rx  Requested medication (s) are on the active medication list -yes  Future visit scheduled -yes  Last refill: 08/29/20 #90 3RF  Notes to clinic: expired Rx  Requested Prescriptions  Pending Prescriptions Disp Refills   allopurinol (ZYLOPRIM) 100 MG tablet [Pharmacy Med Name: ALLOPURINOL  TAB '100MG'$ ] 90 tablet 3    Sig: TAKE 1 TABLET DAILY     Endocrinology:  Gout Agents - allopurinol Failed - 09/02/2021  3:04 PM      Failed - Cr in normal range and within 360 days    Creat  Date Value Ref Range Status  01/12/2021 2.23 (H) 0.70 - 1.28 mg/dL Final   Creatinine,U  Date Value Ref Range Status  01/17/2012 179.2 mg/dL Final         Passed - Uric Acid in normal range and within 360 days    Uric Acid, Serum  Date Value Ref Range Status  01/12/2021 8.0 4.0 - 8.0 mg/dL Final    Comment:    Therapeutic target for gout patients: <6.0 mg/dL .          Passed - Valid encounter within last 12 months    Recent Outpatient Visits           4 months ago Type 2 diabetes mellitus with diabetic nephropathy, with long-term current use of insulin ([place])   [place], DO   7 months ago Annual physical exam   [facility] Olin Hauser, DO   1 year ago Type 2 diabetes mellitus with diabetic nephropathy, with long-term current use of insulin ([place])   [place], DO   1 year ago Annual physical exam   [facility] Olin Hauser, DO   1 year ago Type 2 diabetes mellitus with diabetic nephropathy, with long-term current use of insulin ([place])   [facility], Devonne Doughty, DO       Future Appointments             In 1 month Parks Ranger, Devonne Doughty, DO [facility], [place]   In 9 months Ralene Bathe, MD [place] within  normal limits and completed in the last 12 months    WBC  Date Value Ref Range Status  01/12/2021 9.3 3.8 - 10.8 Thousand/uL Final   RBC  Date Value Ref Range Status  01/12/2021 3.47 (L) 4.20 - 5.80 Million/uL Final   Hemoglobin  Date Value Ref Range Status  01/12/2021 11.6 (L) 13.2 - 17.1 g/dL Final   HGB  Date Value Ref Range Status  04/01/2013 11.5 (L) 13.0 - 18.0 g/dL Final   HCT  Date Value Ref Range Status  01/12/2021 34.0 (L) 38.5 - 50.0 % Final  04/01/2013 33.0 (L) 40.0 - 52.0 % Final   MCHC  Date Value Ref Range Status  01/12/2021 34.1 32.0 - 36.0 g/dL Final   [facility]  Date Value Ref Range Status  01/12/2021 33.4 (H) 27.0 - 33.0 pg Final   MCV  Date Value Ref Range Status  01/12/2021 98.0 80.0 - 100.0 fL Final  04/01/2013 92 80 - 100 fL Final   No results found for: "PLTCOUNTKUC", "LABPLAT", "POCPLA" RDW  Date Value Ref Range Status  01/12/2021 12.8 11.0 - 15.0 % Final  04/01/2013 12.9 11.5 -  14.5 % Final            Requested Prescriptions  Pending Prescriptions Disp Refills   allopurinol (ZYLOPRIM) 100 MG tablet [Pharmacy Med Name: ALLOPURINOL  TAB '100MG'$ ] 90 tablet 3    Sig: TAKE 1 TABLET DAILY     Endocrinology:  Gout Agents - allopurinol Failed - 09/02/2021  3:04 PM      Failed - Cr in normal range and within 360 days    Creat  Date Value Ref Range Status  01/12/2021 2.23 (H) 0.70 - 1.28 mg/dL Final   Creatinine,U  Date Value Ref Range Status  01/17/2012 179.2 mg/dL Final         Passed - Uric Acid in normal range and within 360 days    Uric Acid, Serum  Date Value Ref Range Status  01/12/2021 8.0 4.0 - 8.0 mg/dL Final    Comment:    Therapeutic target for gout patients: <6.0 mg/dL .          Passed - Valid encounter within last 12 months    Recent Outpatient Visits           4 months ago Type 2 diabetes mellitus with diabetic nephropathy, with long-term current use of insulin ([place])   [place], DO   7 months ago Annual physical exam   [facility] Olin Hauser, DO   1 year ago Type 2 diabetes mellitus with diabetic nephropathy, with long-term current use of insulin ([place])   [place], DO   1 year ago Annual physical exam   [facility] Olin Hauser, DO   1 year ago Type 2 diabetes mellitus with diabetic nephropathy, with long-term current use of insulin ([place])   [facility], Devonne Doughty, DO       Future Appointments             In 1 month Parks Ranger, Devonne Doughty, DO [facility], [place]   In 9 months Ralene Bathe, MD [place] within normal limits and completed in the last 12 months    WBC  Date Value Ref Range Status  01/12/2021 9.3 3.8 - 10.8 Thousand/uL Final   RBC  Date Value Ref Range Status  01/12/2021 3.47 (L) 4.20 - 5.80 Million/uL Final   Hemoglobin  Date Value Ref Range Status  01/12/2021 11.6 (L) 13.2 - 17.1 g/dL Final   HGB  Date Value Ref Range Status  04/01/2013 11.5 (L) 13.0 - 18.0 g/dL Final   HCT  Date Value Ref Range Status  01/12/2021 34.0 (L) 38.5 - 50.0 % Final  04/01/2013 33.0 (L) 40.0 - 52.0 % Final   MCHC  Date Value Ref Range Status  01/12/2021 34.1 32.0 - 36.0 g/dL Final   [facility]  Date Value Ref Range Status  01/12/2021 33.4 (H) 27.0 - 33.0 pg Final   MCV  Date Value Ref Range Status  01/12/2021 98.0 80.0 - 100.0 fL Final  04/01/2013 92 80 - 100 fL Final   No results found for: "PLTCOUNTKUC", "LABPLAT", "POCPLA" RDW  Date Value Ref Range Status  01/12/2021 12.8 11.0 - 15.0 % Final  04/01/2013 12.9 11.5 - 14.5 % Final

## 2021-09-20 ENCOUNTER — Other Ambulatory Visit: Payer: Self-pay | Admitting: Family Medicine

## 2021-09-20 DIAGNOSIS — N400 Enlarged prostate without lower urinary tract symptoms: Secondary | ICD-10-CM

## 2021-09-21 NOTE — Telephone Encounter (Signed)
Requested medication (s) are due for refill today: expired medication  Requested medication (s) are on the active medication list: yes  Last refill:  07/13/20 #90 3 refills  Future visit scheduled: yes in 1 month  Notes to clinic:  expired medication . Do you want to renew Rx?     Requested Prescriptions  Pending Prescriptions Disp Refills   tamsulosin (FLOMAX) 0.4 MG CAPS capsule [Pharmacy Med Name: TAMSULOSIN CAP 0.'4MG'$ ] 90 capsule 3    Sig: TAKE 1 CAPSULE DAILY AFTER BREAKFAST     Urology: Alpha-Adrenergic Blocker Passed - 09/20/2021 11:46 AM      Passed - PSA in normal range and within 360 days    PSA  Date Value Ref Range Status  01/12/2021 1.34 < OR = 4.00 ng/mL Final    Comment:    The total PSA value from this assay system is  standardized against the WHO standard. The test  result will be approximately 20% lower when compared  to the equimolar-standardized total PSA (Beckman  Coulter). Comparison of serial PSA results should be  interpreted with this fact in mind. . This test was performed using the Siemens  chemiluminescent method. Values obtained from  different assay methods cannot be used interchangeably. PSA levels, regardless of value, should not be interpreted as absolute evidence of the presence or absence of disease.          Passed - Last BP in normal range    BP Readings from Last 1 Encounters:  04/26/21 128/62         Passed - Valid encounter within last 12 months    Recent Outpatient Visits           4 months ago Type 2 diabetes mellitus with diabetic nephropathy, with long-term current use of insulin ([place])   [place], DO   8 months ago Annual physical exam   [facility] Olin Hauser, DO   1 year ago Type 2 diabetes mellitus with diabetic nephropathy, with long-term current use of insulin ([place])   [place], DO   1 year ago Annual  physical exam   [facility] Olin Hauser, DO   2 years ago Type 2 diabetes mellitus with diabetic nephropathy, with long-term current use of insulin ([place])   [facility] [place], Devonne Doughty, DO       Future Appointments             In 1 month Parks [place], Devonne Doughty, DO [facility], [place]   In 8 months Ralene Bathe, MD [place]

## 2021-10-04 ENCOUNTER — Other Ambulatory Visit: Payer: Self-pay | Admitting: Family Medicine

## 2021-10-04 DIAGNOSIS — E1169 Type 2 diabetes mellitus with other specified complication: Secondary | ICD-10-CM

## 2021-10-05 LAB — PROTEIN / CREATININE RATIO, URINE
Albumin, U: 8
Creatinine, Urine: 122

## 2021-10-05 LAB — MICROALBUMIN / CREATININE URINE RATIO: Microalb Creat Ratio: 0.066

## 2021-10-05 NOTE — Telephone Encounter (Signed)
Requested Prescriptions  Pending Prescriptions Disp Refills  . atorvastatin (LIPITOR) 20 MG tablet [Pharmacy Med Name: ATORVASTATIN TAB '20MG'$ ] 90 tablet 0    Sig: TAKE 1 TABLET AT BEDTIME     Cardiovascular:  Antilipid - Statins Failed - 10/04/2021  1:51 PM      Failed - Lipid Panel in normal range within the last 12 months    Cholesterol  Date Value Ref Range Status  01/12/2021 155 <200 mg/dL Final   LDL Cholesterol (Calc)  Date Value Ref Range Status  01/12/2021 75 mg/dL (calc) Final    Comment:    Reference range: <100 . Desirable range <100 mg/dL for primary prevention;   <70 mg/dL for patients with CHD or diabetic patients  with > or = 2 CHD risk factors. Marland Kitchen LDL-C is now calculated using the Martin-Hopkins  calculation, which is a validated novel method providing  better accuracy than the Friedewald equation in the  estimation of LDL-C.  Cresenciano Genre et al. Annamaria Helling. 9480;165(53): 2061-2068  (http://education.[facility].com/faq/FAQ164)    Direct LDL  Date Value Ref Range Status  01/17/2012 114.4 mg/dL Final    Comment:    Optimal:  <100 mg/dLNear or Above Optimal:  100-129 mg/dLBorderline High:  130-159 mg/dLHigh:  160-189 mg/dLVery High:  >190 mg/dL   HDL  Date Value Ref Range Status  01/12/2021 36 (L) > OR = 40 mg/dL Final   Triglycerides  Date Value Ref Range Status  01/12/2021 364 (H) <150 mg/dL Final    Comment:    . If a non-fasting specimen was collected, consider repeat triglyceride testing on a fasting specimen if clinically indicated.  Yates Decamp et al. J. of Clin. Lipidol. 7482;7:078-675. Marland Kitchen          Passed - Patient is not pregnant      Passed - Valid encounter within last 12 months    Recent Outpatient Visits          5 months ago Type 2 diabetes mellitus with diabetic nephropathy, with long-term current use of insulin ([place])   [place], DO   8 months ago Annual physical exam   [facility] Olin Hauser, DO   1 year ago Type 2 diabetes mellitus with diabetic nephropathy, with long-term current use of insulin ([place])   [place], DO   1 year ago Annual physical exam   [facility] Olin Hauser, DO   2 years ago Type 2 diabetes mellitus with diabetic nephropathy, with long-term current use of insulin ([place])   [facility] Parks [place], Devonne Doughty, DO      Future Appointments            In 3 weeks Parks Ranger, Devonne Doughty, DO [facility], [place]   In 8 months Ralene Bathe, MD [place]

## 2021-10-26 ENCOUNTER — Ambulatory Visit: Payer: Federal, State, Local not specified - PPO | Admitting: Family Medicine

## 2021-10-26 VITALS — BP 139/61 | HR 98 | Ht 66.5 in | Wt 256.0 lb

## 2021-10-26 DIAGNOSIS — E1121 Type 2 diabetes mellitus with diabetic nephropathy: Secondary | ICD-10-CM | POA: Diagnosis not present

## 2021-10-26 DIAGNOSIS — Z794 Long term (current) use of insulin: Secondary | ICD-10-CM

## 2021-10-26 LAB — POCT GLYCOSYLATED HEMOGLOBIN (HGB A1C): Hemoglobin A1C: 7.4 % — AB (ref 4.0–5.6)

## 2021-10-26 NOTE — Patient Instructions (Addendum)
Thank you for coming to the office today.  Recent Labs    01/12/21 0809 04/26/21 0858 10/26/21 0828  HGBA1C 7.3* 8.0* 7.4*   Recommend to start taking Tylenol Extra Strength '500mg'$  tabs - take 1 to 2 tabs per dose (max '1000mg'$ ) every 6-8 hours for pain (take regularly, don't skip a dose for next 7 days), max 24 hour daily dose is 6 tablets or '3000mg'$ . In the future you can repeat the same everyday Tylenol course for 1-2 weeks at a time.   Keep up with Dr Candiss Norse and the fluid medication.   Please schedule a Follow-up Appointment to: Return in about 3 months (around 01/26/2022) for 3 month follow-up DM CKD updates from specialists.  If you have any other questions or concerns, please feel free to call the office or send a message through [facility]. You may also schedule an earlier appointment if necessary.  Additionally, you may be receiving a survey about your experience at our office within a few days to 1 week by e-mail or mail. We value your feedback.  Nobie Putnam, DO [place]

## 2021-10-26 NOTE — Progress Notes (Unsigned)
Subjective:    Patient ID: [name], male    DOB: 1945/02/25, 76 y.o.   MRN: [id number]  [name] is a 76 y.o. male presenting on 10/26/2021 for Diabetes, Hypertension, and Shoulder Pain   HPI  Specialists Nephrology - Dr Murlean Iba ([place]) Endocrinology - Dr Mee Hives [facility] Endocrinology) Cardiology - Dr Serafina Royals [facility] Cardiology)   CHRONIC DM, Type 2: Chronic problem.  Followed by [facility] Endocrinology Dr Honor Junes He was taken off Metformin by Nephrology.  Due for A1c today - He has CGM, monitoring readings Most recent lab A1c 7.3 in 01/2021 He was improving glucose until recent steroid shot in Thumb Off Sodas 1 year Back on Jardiance per Renal Managed on Basal insulin Tresiba and Humalog mealtime, also Glyburide per Endocrine   Previously on Furosemide then switched to Torsemide  Reports  good compliance. Tolerating well w/o side-effects Currently on ARB Has Eye Doctor [facility]) Has upcoming visit with Podiatrist in 2 weeks for toenail trimming. Dr Prudence Davidson [facility] Denies polyuria, visual changes, numbness or tingling   HTN CKD IV CAD / S/p CABG x5 in 2001 Right Carotid Stenosis 50% [facility] Cardiology Dr Nehemiah Massed Last pharmacological stress, done 04/2018 showed no ischemia, ECHO had normal LVEF On Carvedilol '25mg'$  BID, Telmisartan-HCTZ 80-'25mg'$  daily  Bilateral Shoulder pain Reports chronic issue for few years, with worsening R>L shoulder pain worse with lifting activity. He has years of repetitive activity. Taking Aspirin '325mg'$  daily.   Left Hand Trigger Thumb Osteoarthritis Has seen Orthopedics, had injection, raised his sugar levels.    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 Recent result with Uric Acid 8.0 01/2021, he is on Allopurinol '100mg'$  daily, max dose for renal   GERD Chronic problem On esomeprazole '20mg'$  daily       10/26/2021    9:09 AM 01/19/2021    9:01 AM 01/13/2020    9:03 AM  Depression screen PHQ 2/9  Decreased Interest 0 0 0  Down, Depressed, Hopeless 0 0 0  PHQ - 2 Score 0 0 0  Altered sleeping 3 3   Tired, decreased energy 1 1   Change in appetite 0 0   Feeling bad or failure about yourself  0 0   Trouble concentrating 0 0   Moving slowly or fidgety/restless 0 0   Suicidal thoughts 0 0   PHQ-9 Score 4 4   Difficult doing work/chores Not difficult at all Not difficult at all     Social History   Tobacco Use   Smoking status: Never   Smokeless tobacco: Never   Tobacco comments:    tobacco use- no   Substance Use Topics   Alcohol use: No   Drug use: No    Review of Systems Per HPI unless specifically indicated above     Objective:    BP 139/61   Pulse 98   Ht 5' 6.5" (1.689 m)   Wt 256 lb (116.1 kg)   SpO2 100%   BMI 40.70 kg/m   Wt Readings from Last 3 Encounters:  10/26/21 256 lb (116.1 kg)  04/26/21 254 lb (115.2 kg)  01/19/21 244 lb 6.4 oz (110.9 kg)    Physical Exam Vitals and nursing note reviewed.  Constitutional:      General: He is not in acute distress.    Appearance: He is well-developed. He is not diaphoretic.  Comments: Well-appearing, comfortable, cooperative  HENT:     Head: Normocephalic and atraumatic.  Eyes:     General:        Right eye: No discharge.        Left eye: No discharge.     Conjunctiva/sclera: Conjunctivae normal.  Neck:     Thyroid: No thyromegaly.  Cardiovascular:     Rate and Rhythm: Normal rate and regular rhythm.     Pulses: Normal pulses.     Heart sounds: Normal heart sounds. No murmur heard. Pulmonary:     Effort: Pulmonary effort is normal. No respiratory distress.     Breath sounds: Normal breath sounds. No wheezing or rales.  Musculoskeletal:        General: Normal range of motion.     Cervical back: Normal range of motion and neck supple.  Lymphadenopathy:     Cervical: No cervical adenopathy.  Skin:     General: Skin is warm and dry.     Findings: No erythema or rash.  Neurological:     Mental Status: He is alert and oriented to person, place, and time. Mental status is at baseline.  Psychiatric:        Behavior: Behavior normal.     Comments: Well groomed, good eye contact, normal speech and thoughts     Recent Labs    01/12/21 0809 04/26/21 0858 10/26/21 0828  HGBA1C 7.3* 8.0* 7.4*     Results for orders placed or performed in visit on 10/26/21  POCT HgB A1C  Result Value Ref Range   Hemoglobin A1C 7.4 (A) 4.0 - 5.6 %      Assessment & Plan:   Problem List Items Addressed This Visit     Type 2 diabetes mellitus with diabetic nephropathy (HCC) - Primary   Relevant Medications   empagliflozin (JARDIANCE) 10 MG TABS tablet   Other Relevant Orders   POCT HgB A1C (Completed)    A1c 7.4 today Controlled Followed by [facility] Dr Honor Junes No changes today to regimen, discussed risk of hypoglycemia, caution with current regimen Will likely need less insulin in future if remains CKD On Jardiance per Nephrology  CKD  Continue fluid management per Nephrology and Cardiology  No orders of the defined types were placed in this encounter.     Follow up plan: Return in about 3 months (around 01/26/2022) for 3 month follow-up DM CKD updates from specialists.   Nobie Putnam, [facility] 10/26/2021, 8:17 AM

## 2021-11-11 ENCOUNTER — Encounter: Payer: Self-pay | Admitting: Podiatry

## 2021-11-11 ENCOUNTER — Ambulatory Visit (INDEPENDENT_AMBULATORY_CARE_PROVIDER_SITE_OTHER): Payer: Federal, State, Local not specified - PPO | Admitting: Podiatry

## 2021-11-11 DIAGNOSIS — M79674 Pain in right toe(s): Secondary | ICD-10-CM | POA: Diagnosis not present

## 2021-11-11 DIAGNOSIS — E119 Type 2 diabetes mellitus without complications: Secondary | ICD-10-CM | POA: Diagnosis not present

## 2021-11-11 DIAGNOSIS — M79675 Pain in left toe(s): Secondary | ICD-10-CM | POA: Diagnosis not present

## 2021-11-11 DIAGNOSIS — L608 Other nail disorders: Secondary | ICD-10-CM | POA: Insufficient documentation

## 2021-11-11 DIAGNOSIS — B351 Tinea unguium: Secondary | ICD-10-CM | POA: Diagnosis not present

## 2021-11-11 NOTE — Progress Notes (Signed)
This patient returns to my office for at risk foot care.  This patient requires this care by a professional since this patient will be at risk due to having  diabetes.  This patient is unable to cut nails himself since the patient cannot reach his nails.These nails are painful walking and wearing shoes.  This patient presents for at risk foot care today.  General Appearance  Alert, conversant and in no acute stress.  Vascular  Dorsalis pedis and posterior tibial  pulses are palpable  bilaterally.  Capillary return is within normal limits  bilaterally. Temperature is within normal limits  bilaterally.  Neurologic  Senn-Weinstein monofilament wire test within normal limits  bilaterally. Muscle power within normal limits bilaterally.  Nails Thick disfigured discolored nails with subungual debris  hallux nails  bilaterally. No evidence of bacterial infection or drainage bilaterally.  Orthopedic  No limitations of motion  feet .  No crepitus or effusions noted.  No bony pathology or digital deformities noted.  Skin  normotropic skin with no porokeratosis noted bilaterally.  No signs of infections or ulcers noted.     Onychomycosis  Pain in right toes  Pain in left toes  Consent was obtained for treatment procedures.   Mechanical debridement of nails 1-5  bilaterally performed with a nail nipper.  Filed with dremel without incident.    Return office visit     3 months                Told patient to return for periodic foot care and evaluation due to potential at risk complications.   Rifky Lapre DPM   

## 2021-12-01 ENCOUNTER — Encounter: Payer: Self-pay | Admitting: Podiatry

## 2021-12-01 LAB — COMPREHENSIVE METABOLIC PANEL: eGFR: 21

## 2021-12-01 LAB — BASIC METABOLIC PANEL: Creatinine: 3 — AB (ref 0.6–1.3)

## 2022-01-04 ENCOUNTER — Other Ambulatory Visit: Payer: Self-pay | Admitting: Family Medicine

## 2022-01-04 DIAGNOSIS — E1169 Type 2 diabetes mellitus with other specified complication: Secondary | ICD-10-CM

## 2022-01-05 NOTE — Telephone Encounter (Signed)
Requested Prescriptions  Pending Prescriptions Disp Refills   atorvastatin (LIPITOR) 20 MG tablet [Pharmacy Med Name: ATORVASTATIN TAB '20MG'$ ] 90 tablet 0    Sig: TAKE 1 TABLET AT BEDTIME     Cardiovascular:  Antilipid - Statins Failed - 01/04/2022 10:01 AM      Failed - Lipid Panel in normal range within the last 12 months    Cholesterol  Date Value Ref Range Status  01/12/2021 155 <200 mg/dL Final   LDL Cholesterol (Calc)  Date Value Ref Range Status  01/12/2021 75 mg/dL (calc) Final    Comment:    Reference range: <100 . Desirable range <100 mg/dL for primary prevention;   <70 mg/dL for patients with CHD or diabetic patients  with > or = 2 CHD risk factors. Marland Kitchen LDL-C is now calculated using the Martin-Hopkins  calculation, which is a validated novel method providing  better accuracy than the Friedewald equation in the  estimation of LDL-C.  Cresenciano Genre et al. Annamaria Helling. 9892;119(41): 2061-2068  (http://education.[facility].com/faq/FAQ164)    Direct LDL  Date Value Ref Range Status  01/17/2012 114.4 mg/dL Final    Comment:    Optimal:  <100 mg/dLNear or Above Optimal:  100-129 mg/dLBorderline High:  130-159 mg/dLHigh:  160-189 mg/dLVery High:  >190 mg/dL   HDL  Date Value Ref Range Status  01/12/2021 36 (L) > OR = 40 mg/dL Final   Triglycerides  Date Value Ref Range Status  01/12/2021 364 (H) <150 mg/dL Final    Comment:    . If a non-fasting specimen was collected, consider repeat triglyceride testing on a fasting specimen if clinically indicated.  Yates Decamp et al. J. of Clin. Lipidol. 7408;1:448-185. Marland Kitchen          Passed - Patient is not pregnant      Passed - Valid encounter within last 12 months    Recent Outpatient Visits           2 months ago Type 2 diabetes mellitus with diabetic nephropathy, with long-term current use of insulin ([place])   [place], DO   8 months ago Type 2 diabetes mellitus with diabetic  nephropathy, with long-term current use of insulin [facility])   [facility] Olin Hauser, DO   11 months ago Annual physical exam   [facility] Olin Hauser, DO   1 year ago Type 2 diabetes mellitus with diabetic nephropathy, with long-term current use of insulin ([place])   [facility] Olin Hauser, DO   1 year ago Annual physical exam   [place], DO       Future Appointments             In 3 weeks [place], Devonne Doughty, DO [facility] [place], [place]   In 5 months Ralene Bathe, MD [place]

## 2022-01-06 ENCOUNTER — Ambulatory Visit: Payer: Federal, State, Local not specified - PPO | Admitting: Dermatology

## 2022-01-26 ENCOUNTER — Ambulatory Visit: Payer: Federal, State, Local not specified - PPO | Admitting: Family Medicine

## 2022-01-26 ENCOUNTER — Other Ambulatory Visit: Payer: Self-pay | Admitting: Family Medicine

## 2022-01-26 ENCOUNTER — Encounter: Payer: Self-pay | Admitting: Family Medicine

## 2022-01-26 VITALS — BP 118/60 | HR 94 | Ht 66.5 in | Wt 246.6 lb

## 2022-01-26 DIAGNOSIS — E1121 Type 2 diabetes mellitus with diabetic nephropathy: Secondary | ICD-10-CM

## 2022-01-26 DIAGNOSIS — Z0000 Encounter for general adult medical examination without abnormal findings: Secondary | ICD-10-CM

## 2022-01-26 DIAGNOSIS — I129 Hypertensive chronic kidney disease with stage 1 through stage 4 chronic kidney disease, or unspecified chronic kidney disease: Secondary | ICD-10-CM

## 2022-01-26 DIAGNOSIS — E1169 Type 2 diabetes mellitus with other specified complication: Secondary | ICD-10-CM

## 2022-01-26 DIAGNOSIS — E785 Hyperlipidemia, unspecified: Secondary | ICD-10-CM

## 2022-01-26 DIAGNOSIS — M1A0790 Idiopathic chronic gout, unspecified ankle and foot, without tophus (tophi): Secondary | ICD-10-CM

## 2022-01-26 DIAGNOSIS — Z23 Encounter for immunization: Secondary | ICD-10-CM | POA: Diagnosis not present

## 2022-01-26 DIAGNOSIS — N183 Chronic kidney disease, stage 3 unspecified: Secondary | ICD-10-CM

## 2022-01-26 DIAGNOSIS — Z794 Long term (current) use of insulin: Secondary | ICD-10-CM

## 2022-01-26 DIAGNOSIS — I25810 Atherosclerosis of coronary artery bypass graft(s) without angina pectoris: Secondary | ICD-10-CM

## 2022-01-26 DIAGNOSIS — N400 Enlarged prostate without lower urinary tract symptoms: Secondary | ICD-10-CM

## 2022-01-26 LAB — POCT GLYCOSYLATED HEMOGLOBIN (HGB A1C): Hemoglobin A1C: 6.9 % — AB (ref 4.0–5.6)

## 2022-01-26 MED ORDER — ATORVASTATIN CALCIUM 20 MG PO TABS: 20.0000 mg | ORAL_TABLET | Freq: Every day | ORAL | 3 refills | Status: DC

## 2022-01-26 NOTE — Patient Instructions (Addendum)
Thank you for coming to the office today.  Recent Labs    04/26/21 0858 10/26/21 0828 01/26/22 0818  HGBA1C 8.0* 7.4* 6.9*   Keep on the reduced insulin regimen. And agree with lower dose Glyburide. Try down to '5mg'$  one pill twice a day with meal< OR 2 pills in morning with breakfast and 0 in evening.  Referral sent to Nutritionist to discuss diet. They will call you.  [facility]   Address: [address] [place], [place], [place] [address] Phone: [phone]  DUE for FASTING BLOOD WORK (no food or drink after midnight before the lab appointment, only water or coffee without cream/sugar on the morning of)  SCHEDULE "Lab Only" visit in the morning at the clinic for lab draw in 3 MONTHS   - Make sure Lab Only appointment is at about 1 week before your next appointment, so that results will be available  For Lab Results, once available within 2-3 days of blood draw, you can can log in to MyChart online to view your results and a brief explanation. Also, we can discuss results at next follow-up visit.   Please schedule a Follow-up Appointment to: Return in about 3 months (around 04/27/2022) for 3 month fasting lab only then 1 week later Annual Physical.  If you have any other questions or concerns, please feel free to call the office or send a message through [place]. You may also schedule an earlier appointment if necessary.  Additionally, you may be receiving a survey about your experience at our office within a few days to 1 week by e-mail or mail. We value your feedback.  Nobie Putnam, DO [place]

## 2022-01-26 NOTE — Assessment & Plan Note (Signed)
Controlled cholesterol on statin  lifestyle CAD, PAD  Plan: 1. Continue current meds - Atorvastatin '20mg'$  daily 2. On ASA 325 3 Encourage improved lifestyle - low carb/cholesterol, reduce portion size, continue improving regular exercise

## 2022-01-26 NOTE — Assessment & Plan Note (Signed)
Well-controlled DM with A1c 6.9 (improved from prior 8 > 7) improved control on insulin regimen per Endocrinology Complications - CKD III, Hyperlipidemia, GERD, obesity, CAD -  increases risk of future cardiovascular complications   Complication HYPOGLYCEMIA with decline in renal function  Off Metformin due to CKD  Plan:  1. REDUCE Glyburide - advised him to immediately reduce to 1 pill '5mg'$  TWICE A DAY with meals instead of 2 twice a day. If still having Hypoglycemia episodes, he should completely DISCONTINUE Glyburide and notify his Endocrinology - Continue current therapy -  Toujeo, Lispro adjusting doses per Endocrinology  2. Encourage improved lifestyle - low carb, low sugar diet, reduce portion size, continue improving regular exercise 3. Check CBG , bring log to next visit for review 4. Continue ASA,  ARB, Statin

## 2022-01-26 NOTE — Progress Notes (Signed)
Subjective:    Patient ID: [name], male    DOB: Feb 20, 1945, 77 y.o.   MRN: [id number]  [name] is a 77 y.o. male presenting on 01/26/2022 for Diabetes and Chronic Kidney Disease   HPI  Specialists Nephrology - Dr Murlean Iba ([place]) Endocrinology - Dr Mee Hives [facility] Endocrinology) Cardiology - Dr Serafina Royals [facility] Cardiology)   CHRONIC DM, Type 2: Chronic problem.  Followed by [facility] Endocrinology Dr Honor Junes  Due for A1c today - He has CGM, monitoring readings, avg CGM 120 Previously A1c trend 8 > 7.3 now due for A1c today Off Sodas 1 year Back on Jardiance per Renal Managed on Basal insulin Tresiba and Humalog mealtime, also Glyburide per Endocrine   He has self managed his dosing with Basal Tresiba down 60 range, and Humalog 10-12 range - he still takes Glyburide '5mg'$  x 2 = '10mg'$  TWICE A DAY with meals OFF Metformin  He does admit to overnight hypoglycemia, resolved with BY MOUTH intake He is using CGM and has had had a much better handle on his sugar control overall.  Reports  good compliance. Tolerating well w/o side-effects Currently on ARB Has Eye Doctor [facility]) Has upcoming visit with Podiatrist in 2 weeks for toenail trimming. Dr Prudence Davidson [facility] Denies polyuria, visual changes, numbness or tingling   HTN CKD IV CAD / S/p CABG x5 in 2001 Right Carotid Stenosis 50% [facility] Cardiology Last pharmacological stress, done 04/2018 showed no ischemia, ECHO had normal LVEF On Carvedilol half tab = 12.'5mg'$  BID, half tab Telmisartan-HCTZ 80-'25mg'$  daily On torsemide '40mg'$  daily   Bilateral Shoulder pain Osteoarthritis multiple joints   Switched from Ibuprofen to Tylenol, doing well with this.     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 Recent result with Uric Acid 8.0 01/2021, he is on Allopurinol '100mg'$  daily, max dose for renal    GERD Chronic problem On esomeprazole '20mg'$  daily  Health Maintenance: Due for shingrix #2 vaccine today     01/26/2022    8:22 AM 10/26/2021    9:09 AM 01/19/2021    9:01 AM  Depression screen PHQ 2/9  Decreased Interest 0 0 0  Down, Depressed, Hopeless 0 0 0  PHQ - 2 Score 0 0 0  Altered sleeping '1 3 3  '$ Tired, decreased energy 0 1 1  Change in appetite 1 0 0  Feeling bad or failure about yourself  0 0 0  Trouble concentrating 0 0 0  Moving slowly or fidgety/restless 0 0 0  Suicidal thoughts 0 0 0  PHQ-9 Score '2 4 4  '$ Difficult doing work/chores Not difficult at all Not difficult at all Not difficult at all    Social History   Tobacco Use   Smoking status: Never   Smokeless tobacco: Never   Tobacco comments:    tobacco use- no   Substance Use Topics   Alcohol use: No   Drug use: No    Review of Systems Per HPI unless specifically indicated above     Objective:    BP 118/60   Pulse 94   Ht 5' 6.5" (1.689 m)   Wt 246 lb 9.6 oz (111.9 kg)   SpO2 99%   BMI 39.21 kg/m   Wt Readings from Last 3 Encounters:  01/26/22 246 lb 9.6 oz (111.9 kg)  10/26/21 256 lb (116.1 kg)  04/26/21 254 lb (115.2  kg)    Physical Exam Vitals and nursing note reviewed.  Constitutional:      General: He is not in acute distress.    Appearance: Normal appearance. He is well-developed. He is obese. He is not diaphoretic.     Comments: Well-appearing, comfortable, cooperative  HENT:     Head: Normocephalic and atraumatic.  Eyes:     General:        Right eye: No discharge.        Left eye: No discharge.     Conjunctiva/sclera: Conjunctivae normal.  Cardiovascular:     Rate and Rhythm: Normal rate.  Pulmonary:     Effort: Pulmonary effort is normal.  Skin:    General: Skin is warm and dry.     Findings: No erythema or rash.  Neurological:     Mental Status: He is alert and oriented to person, place, and time.  Psychiatric:        Mood and Affect: Mood normal.         Behavior: Behavior normal.        Thought Content: Thought content normal.     Comments: Well groomed, good eye contact, normal speech and thoughts    Abstract Creatinine / GFR  10/05/21 Urine creat 122, protein 8, ratio 0.066  12/01/21 Cr 2.95, eGFR 21   Results for orders placed or performed in visit on 01/26/22  Microalbumin / creatinine urine ratio  Result Value Ref Range   Microalb Creat Ratio 0.066   Protein / creatinine ratio, urine  Result Value Ref Range   Creatinine, Urine 122    Albumin, U 8   Basic metabolic panel  Result Value Ref Range   Creatinine 3.0 (A) 0.6 - 1.3  Comprehensive metabolic panel  Result Value Ref Range   eGFR 21   POCT HgB A1C  Result Value Ref Range   Hemoglobin A1C 6.9 (A) 4.0 - 5.6 %   Recent Labs    04/26/21 0858 10/26/21 0828 01/26/22 0818  HGBA1C 8.0* 7.4* 6.9*       Assessment & Plan:   Problem List Items Addressed This Visit     Benign hypertension with CKD (chronic kidney disease) stage III (HCC)    Well-controlled HTN Complication with 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 Continue current BP HALF tabs of Carvedilol '25mg'$  = 12.'5mg'$  BID, Telmisartan-HCTZ 80-'25mg'$  (HALF tab) daily - Torsemide '40mg'$  daily 2. Encourage improved lifestyle - low sodium diet, regular exercise 3. Continue monitor BP outside office, bring readings to next visit, if persistently >140/90 or new symptoms notify office sooner      Relevant Medications   atorvastatin (LIPITOR) 20 MG tablet   Other Relevant Orders   Amb ref to Medical Nutrition Therapy-MNT   Hyperlipidemia associated with type 2 diabetes mellitus ([place])    Controlled cholesterol on statin  lifestyle CAD, PAD  Plan: 1. Continue current meds - Atorvastatin '20mg'$  daily 2. On ASA 325 3 Encourage improved lifestyle - low carb/cholesterol, reduce portion size, continue improving regular exercise      Relevant Medications   glyBURIDE (DIABETA) 5 MG  tablet   atorvastatin (LIPITOR) 20 MG tablet   Morbid obesity (HCC)   Relevant Medications   glyBURIDE (DIABETA) 5 MG tablet   Type 2 diabetes mellitus with diabetic nephropathy ([place]) - Primary    Well-controlled DM with A1c 6.9 (improved from prior 8 > 7) improved control on insulin regimen per Endocrinology Complications -  CKD III, Hyperlipidemia, GERD, obesity, CAD -  increases risk of future cardiovascular complications   Complication HYPOGLYCEMIA with decline in renal function  Off Metformin due to CKD  Plan:  1. REDUCE Glyburide - advised him to immediately reduce to 1 pill '5mg'$  TWICE A DAY with meals instead of 2 twice a day. If still having Hypoglycemia episodes, he should completely DISCONTINUE Glyburide and notify his Endocrinology - Continue current therapy -  Toujeo, Lispro adjusting doses per Endocrinology  2. Encourage improved lifestyle - low carb, low sugar diet, reduce portion size, continue improving regular exercise 3. Check CBG , bring log to next visit for review 4. Continue ASA,  ARB, Statin      Relevant Medications   glyBURIDE (DIABETA) 5 MG tablet   atorvastatin (LIPITOR) 20 MG tablet   Other Relevant Orders   POCT HgB A1C (Completed)   Amb ref to Medical Nutrition Therapy-MNT   Other Visit Diagnoses     Need for shingles vaccine       Relevant Orders   Varicella-zoster vaccine IM (Completed)       Referral sent to Nutritionist to discuss diabetic and renal diet  [place]   Address: [address] [place], [place], [place] [address] Phone: [phone]  Orders Placed This Encounter  Procedures   Varicella-zoster vaccine IM   Microalbumin / creatinine urine ratio    This external order was created through the Results Console.   Protein / creatinine ratio, urine    This external order was created through the Results Console.   Basic metabolic panel    This external order was created through the Results Console.   Comprehensive metabolic  panel    This external order was created through the Results Console.   Amb ref to Medical Nutrition Therapy-MNT    Referral Priority:   Routine    Referral Type:   Consultation    Referral Reason:   Specialty Services Required    Requested Specialty:   Nutrition    Number of Visits Requested:   1   POCT HgB A1C     Meds ordered this encounter  Medications   atorvastatin (LIPITOR) 20 MG tablet    Sig: Take 1 tablet (20 mg total) by mouth at bedtime.    Dispense:  90 tablet    Refill:  3    Please add extra refills, patient is not yet ready for this rx.      Follow up plan: Return in about 3 months (around 04/27/2022) for 3 month fasting lab only then 1 week later Annual Physical.  Future labs ordered for 04/27/22   Nobie Putnam, [facility] 01/26/2022, 8:17 AM

## 2022-01-26 NOTE — Assessment & Plan Note (Signed)
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 Continue current BP HALF tabs of Carvedilol '25mg'$  = 12.'5mg'$  BID, Telmisartan-HCTZ 80-'25mg'$  (HALF tab) daily - Torsemide '40mg'$  daily 2. Encourage improved lifestyle - low sodium diet, regular exercise 3. Continue monitor BP outside office, bring readings to next visit, if persistently >140/90 or new symptoms notify office sooner

## 2022-01-31 ENCOUNTER — Encounter: Payer: Self-pay | Admitting: *Deleted

## 2022-01-31 ENCOUNTER — Encounter: Payer: Federal, State, Local not specified - PPO | Attending: Family Medicine | Admitting: *Deleted

## 2022-01-31 VITALS — BP 122/62 | Ht 66.5 in | Wt 245.4 lb

## 2022-01-31 DIAGNOSIS — Z713 Dietary counseling and surveillance: Secondary | ICD-10-CM | POA: Insufficient documentation

## 2022-01-31 DIAGNOSIS — Z794 Long term (current) use of insulin: Secondary | ICD-10-CM

## 2022-01-31 DIAGNOSIS — E119 Type 2 diabetes mellitus without complications: Secondary | ICD-10-CM | POA: Insufficient documentation

## 2022-01-31 NOTE — Progress Notes (Signed)
Diabetes Self-Management Education  Visit Type: First/Initial  Appt. Start Time: 1325 Appt. End Time: 3532  01/31/2022  [name], identified by name and date of birth, is a 77 y.o. male with a diagnosis of Diabetes: Type 2.   ASSESSMENT  Blood pressure 122/62, height 5' 6.5" (1.689 m), weight 245 lb 6.4 oz (111.3 kg). Body mass index is 39.02 kg/m.   Diabetes Self-Management Education - 01/31/22 1647       Visit Information   Visit Type First/Initial      Initial Visit   Diabetes Type Type 2    Date Diagnosed 25 years    Are you currently following a meal plan? Yes    What type of meal plan do you follow? "cut back"    Are you taking your medications as prescribed? Yes      Health Coping   How would you rate your overall health? Fair      Psychosocial Assessment   Patient Belief/Attitude about Diabetes Other (comment)   "ok"   What is the hardest part about your diabetes right now, causing you the most concern, or is the most worrisome to you about your diabetes?   Making healty food and beverage choices    Self-care barriers None    Self-management support Doctor's office;Family    Patient Concerns Nutrition/Meal planning;Glycemic Control;Medication;Monitoring;Healthy Lifestyle    Special Needs None    Preferred Learning Style Auditory;Visual;Hands on    [place] in progress    How often do you need to have someone help you when you read instructions, pamphlets, or other written materials from your doctor or pharmacy? 1 - Never    What is the last grade level you completed in school? 12th      Pre-Education Assessment   Patient understands the diabetes disease and treatment process. Needs Review    Patient understands incorporating nutritional management into lifestyle. Needs Instruction    Patient undertands incorporating physical activity into lifestyle. Needs Instruction    Patient understands using medications safely. Needs Review     Patient understands monitoring blood glucose, interpreting and using results Demonstrates understanding / competency    Patient understands prevention, detection, and treatment of acute complications. Comprehends key points    Patient understands prevention, detection, and treatment of chronic complications. Compreheands key points    Patient understands how to develop strategies to address psychosocial issues. Needs Review    Patient understands how to develop strategies to promote health/change behavior. Needs Review      Complications   Last HgB A1C per patient/outside source 6.9 %   01/26/2022   How often do you check your blood sugar? > 4 times/day   FreeStyle Libre - 90 day avg 152 mg/dL; >240 6%, 181-240 18%, 70-180 74%, <70 2%; 30 day avg 143 mg/dL; >240 3%, 181-240 16%, 70-180 29%, <70 2%   Number of hypoglycemic episodes per month 8    Can you tell when your blood sugar is low? Yes   has alarms on phone for CGM that alerts him or his wife   What do you do if your blood sugar is low? drinks juice - also has glucose tablets    Have you had a dilated eye exam in the past 12 months? Yes    Have you had a dental exam in the past 12 months? Yes    Are you checking your feet? Yes    How many days per week are you checking your  feet? 7      Dietary Intake   Breakfast only eats 1 meal/day and sometimes a snack    Lunch 2 pm - just stopped eating tv dinners and canned soup due to sodium; grilled salmon, hot dogs, peanut butter, nuts, cheese; green beans, peas, beans, tomatoes, carrots, fried squash, unsalted crackers    Snack (evening) sometimes fruit  - pears or cookie    Beverage(s) water, SF lemonade      Activity / Exercise   Activity / Exercise Type ADL's      Patient Education   Previous Diabetes Education No    Disease Pathophysiology Explored patient's options for treatment of their diabetes    Healthy Eating Role of diet in the treatment of diabetes and the relationship between  the three main macronutrients and blood glucose level;Food label reading, portion sizes and measuring food.;Reviewed blood glucose goals for pre and post meals and how to evaluate the patients' food intake on their blood glucose level.;Meal timing in regards to the patients' current diabetes medication.;Other (comment)   Healthy Eating with Kidney Disease; Phosphorus Content of Foods   Being Active Role of exercise on diabetes management, blood pressure control and cardiac health.    Medications Taught/reviewed insulin/injectables, injection, site rotation, insulin/injectables storage and needle disposal.;Reviewed patients medication for diabetes, action, purpose, timing of dose and side effects.    Monitoring Taught/evaluated CGM (comment);Taught/discussed recording of test results and interpretation of SMBG.;Identified appropriate SMBG and/or A1C goals.    Acute complications Taught prevention, symptoms, and  treatment of hypoglycemia - the 15 rule.    Chronic complications Relationship between chronic complications and blood glucose control    Diabetes Stress and Support Identified and addressed patients feelings and concerns about diabetes      Individualized Goals (developed by patient)   Reducing Risk Other (comment)   improve blood sugars, decrease medications, prevent diabetes complications, lead a healthier lifestyle     Outcomes   Expected Outcomes Demonstrated interest in learning. Expect positive outcomes    Program Status Not Completed         Individualized Plan for Diabetes Self-Management Training:   Learning Objective:  Patient will have a greater understanding of diabetes self-management. Patient education plan is to attend individual and/or group sessions per assessed needs and concerns.   Plan:   Patient Instructions  Check blood sugars before each meal, 2 hrs after meals, before bed  and as needed with FreeStyle Libre  Scan FreeStyle Libre at least 4 x day  Bring  blood sugar records on phone to all MD appointments  Exercise: Walk as tolerated  Eat 3 meals day,   1-2  snacks a day Space meals 4-6 hours apart Don't skip meals - eat at least 1 protein and 1 carbohydrate serving  Carry fast acting glucose and a snack at all times Hold insulin pen in place for 5-10 seconds after injection Rotate injection sites  Call back if you want to schedule another appointment  Expected Outcomes:  Demonstrated interest in learning. Expect positive outcomes  Education material provided:  General Meal Planning Guidelines Simple Meal Plan Healthy Eating with Kidney Disease ([place]) Phosphorus Content of Foods (NCM) Symptoms, causes and treatments of Hypoglycemia FreeStyle Libre AVS  If problems or questions, patient to contact team via:   Johny Drilling, RN, [place] [phone]  Future DSME appointment: PRN

## 2022-01-31 NOTE — Patient Instructions (Signed)
Check blood sugars before each meal, 2 hrs after meals, before bed  and as needed with FreeStyle Libre  Scan FreeStyle Libre at least 4 x day  Bring blood sugar records on phone to all MD appointments  Exercise: Walk as tolerated  Eat 3 meals day,   1-2  snacks a day Space meals 4-6 hours apart Don't skip meals - eat at least 1 protein and 1 carbohydrate serving  Carry fast acting glucose and a snack at all times Hold insulin pen in place for 5-10 seconds after injection Rotate injection sites  Call back if you want to schedule another appointment

## 2022-02-10 ENCOUNTER — Ambulatory Visit: Payer: Federal, State, Local not specified - PPO | Admitting: Podiatry

## 2022-02-17 ENCOUNTER — Encounter: Payer: Self-pay | Admitting: Podiatry

## 2022-02-17 ENCOUNTER — Ambulatory Visit: Payer: Federal, State, Local not specified - PPO | Admitting: Podiatry

## 2022-02-17 VITALS — BP 174/77 | HR 91

## 2022-02-17 DIAGNOSIS — M79674 Pain in right toe(s): Secondary | ICD-10-CM | POA: Diagnosis not present

## 2022-02-17 DIAGNOSIS — B351 Tinea unguium: Secondary | ICD-10-CM | POA: Diagnosis not present

## 2022-02-17 DIAGNOSIS — M79675 Pain in left toe(s): Secondary | ICD-10-CM | POA: Diagnosis not present

## 2022-02-17 NOTE — Progress Notes (Signed)
This patient returns to my office for at risk foot care.  This patient requires this care by a professional since this patient will be at risk due to having  diabetes.  This patient is unable to cut nails himself since the patient cannot reach his nails.These nails are painful walking and wearing shoes.  This patient presents for at risk foot care today.  General Appearance  Alert, conversant and in no acute stress.  Vascular  Dorsalis pedis and posterior tibial  pulses are palpable  bilaterally.  Capillary return is within normal limits  bilaterally. Temperature is within normal limits  bilaterally.  Neurologic  Senn-Weinstein monofilament wire test within normal limits  bilaterally. Muscle power within normal limits bilaterally.  Nails Thick disfigured discolored nails with subungual debris  hallux nails  bilaterally. No evidence of bacterial infection or drainage bilaterally.  Orthopedic  No limitations of motion  feet .  No crepitus or effusions noted.  No bony pathology or digital deformities noted.  Skin  normotropic skin with no porokeratosis noted bilaterally.  No signs of infections or ulcers noted.     Onychomycosis  Pain in right toes  Pain in left toes  Consent was obtained for treatment procedures.   Mechanical debridement of nails 1-5  bilaterally performed with a nail nipper.  Filed with dremel without incident.    Return office visit     3 months                Told patient to return for periodic foot care and evaluation due to potential at risk complications.   Gardiner Barefoot DPM

## 2022-03-19 IMAGING — US US RENAL
1 series · 15 of 25 positions shown · non-contrast
Comparison: None

CLINICAL DATA: Acute kidney injury, history diabetes mellitus,
hypertension, hyperlipidemia

EXAM:
RENAL / URINARY TRACT ULTRASOUND COMPLETE

[Series 1: us renal · 15 of 44 slices shown]
[im 1/44]
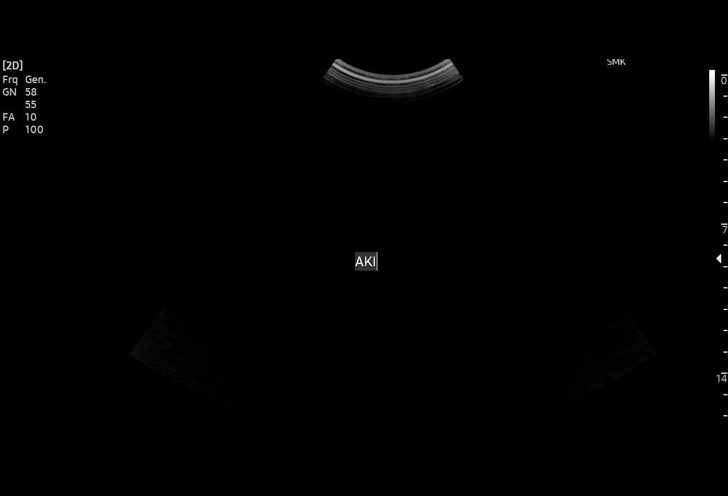
[im 4/44]
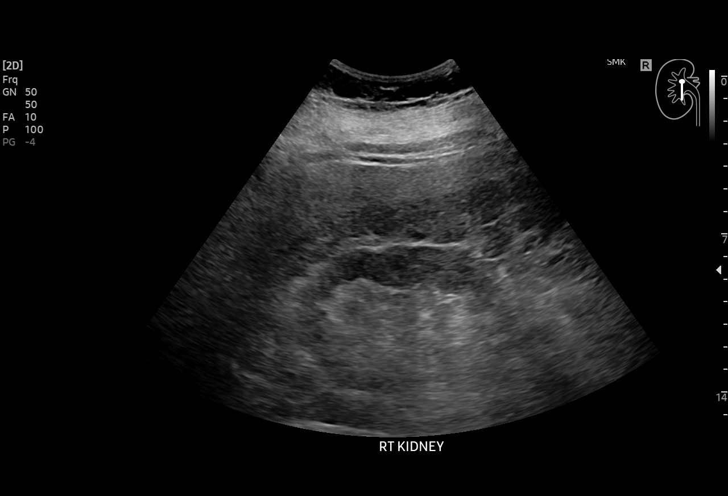
[im 8/44]
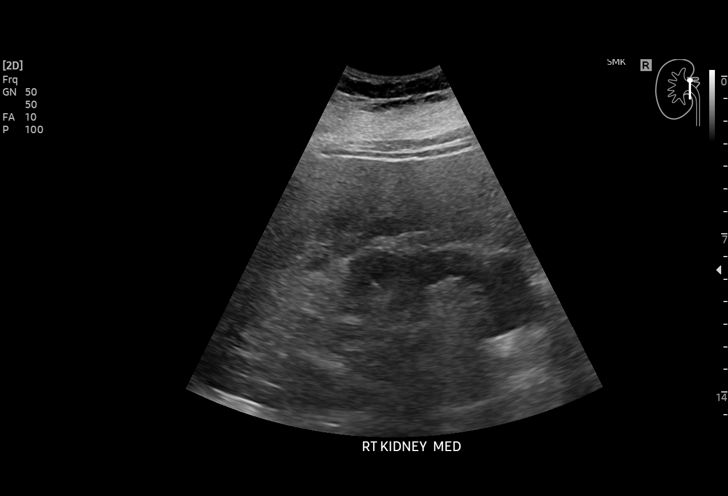
[im 9/44]
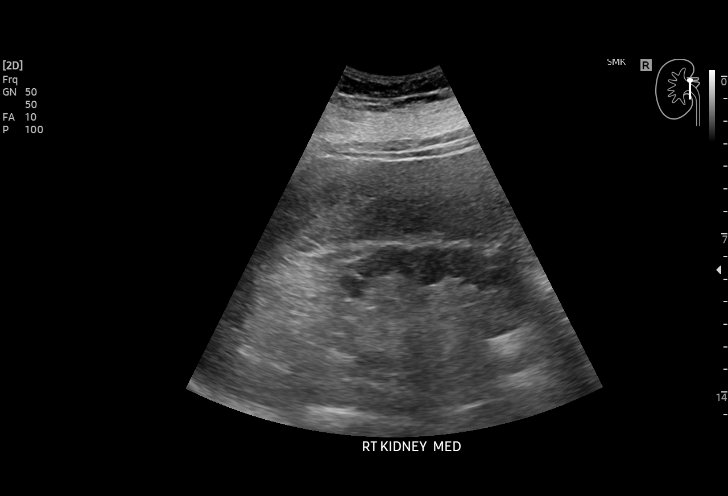
[im 13/44]
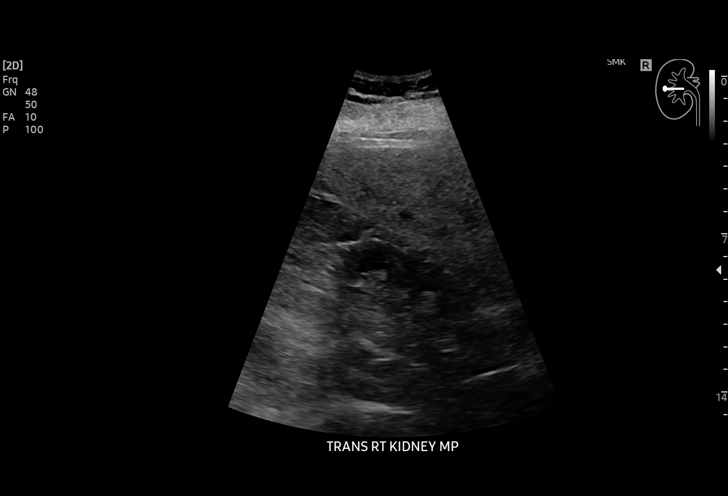
[im 17/44]
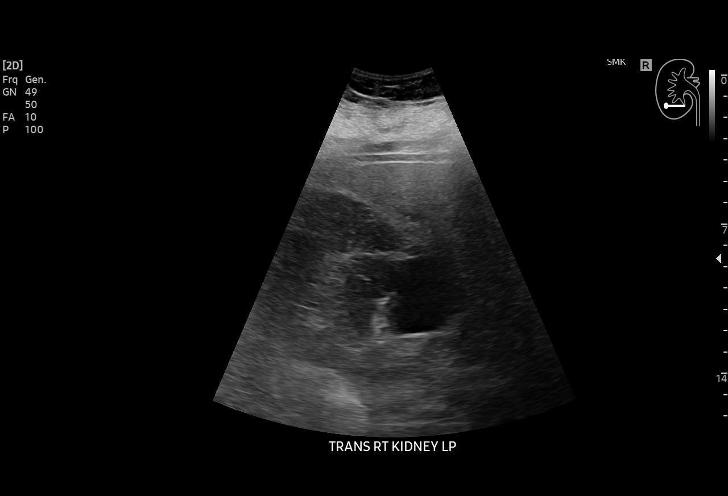
[im 18/44]
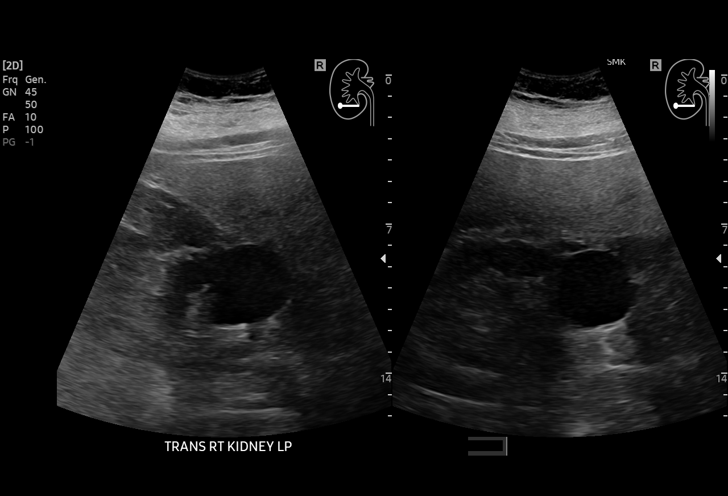
[im 22/44]
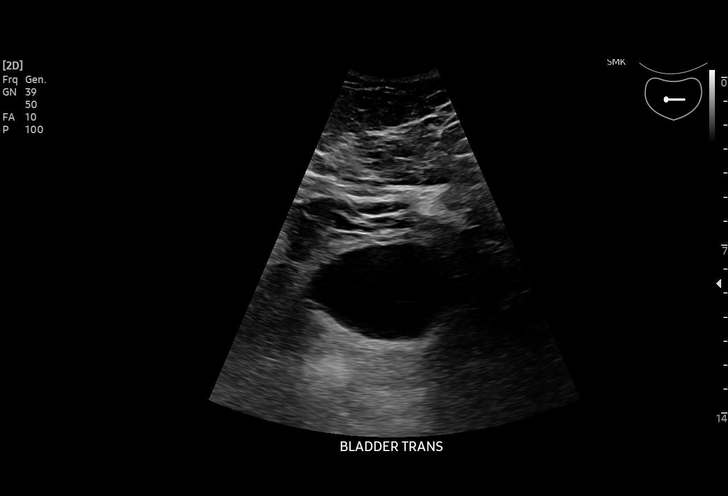
[im 26/44]
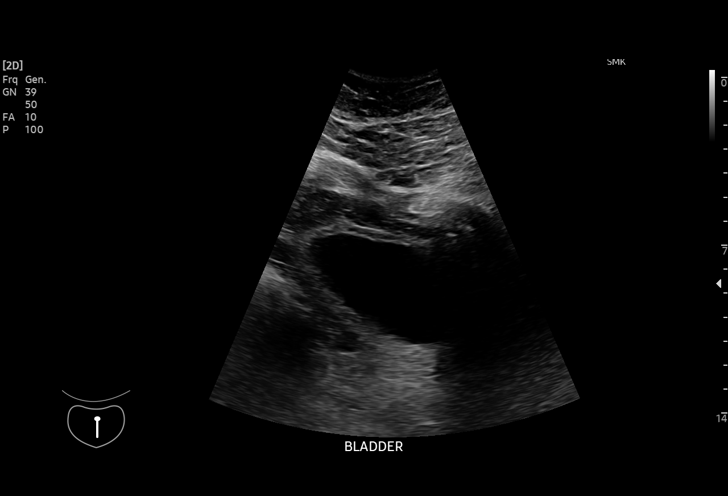
[im 27/44]
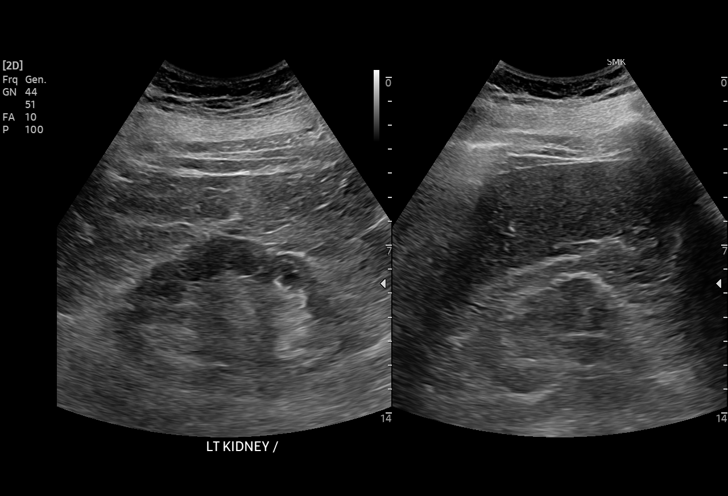
[im 31/44]
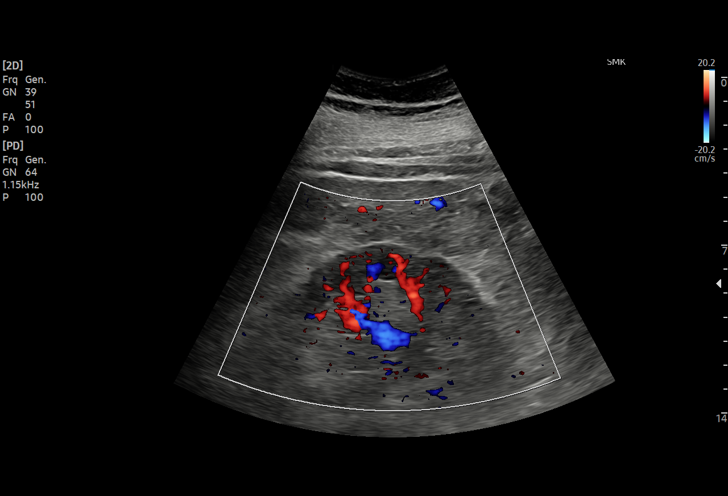
[im 35/44]
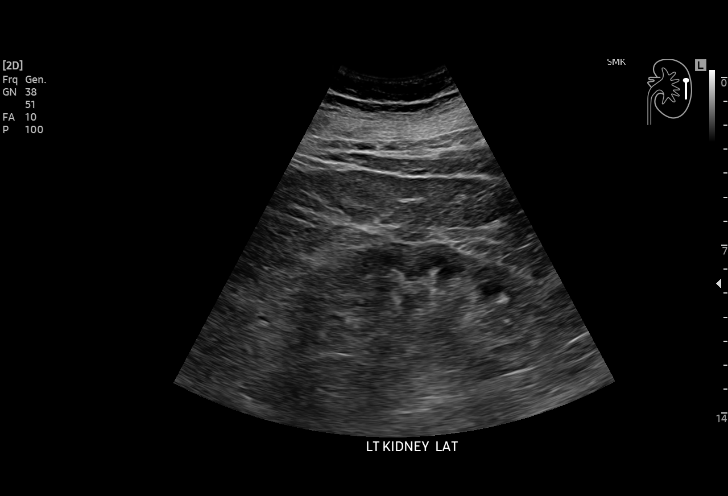
[im 36/44]
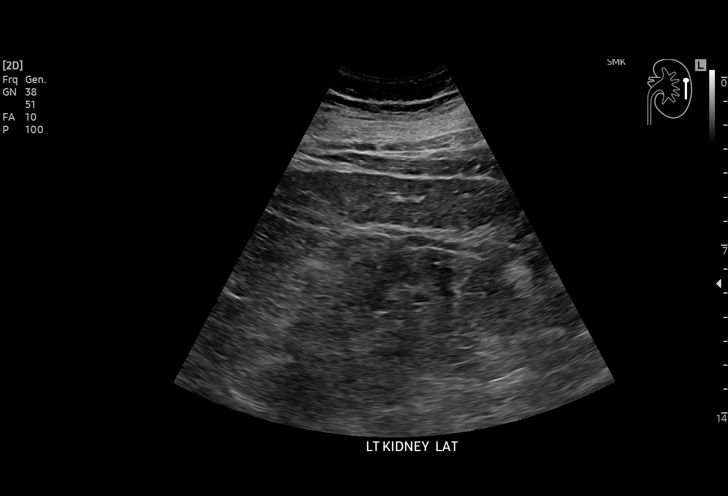
[im 40/44]
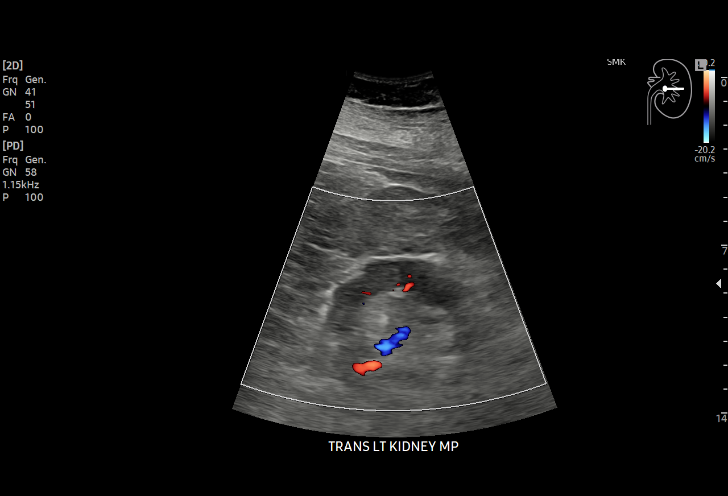
[im 44/44]
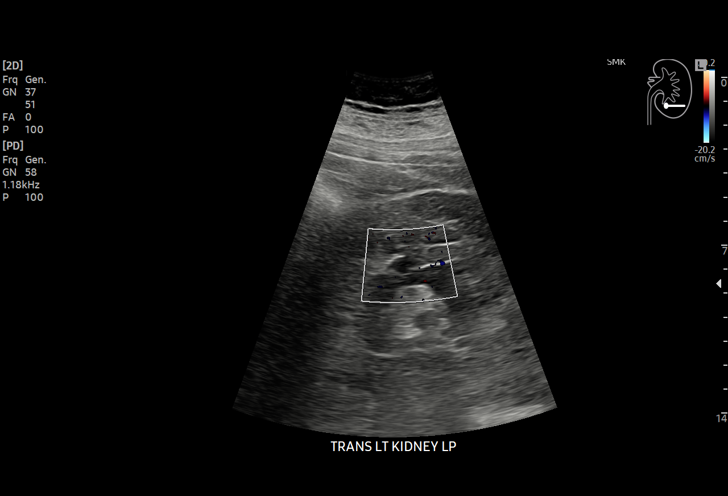

[15 of 25 positions shown; findings below may reference images not displayed]

FINDINGS: Right Kidney:

Renal measurements: 10.0 x 6.6 x 6.4 cm = volume: 220 mL. Marked
cortical thinning. Normal cortical echogenicity. Cyst at inferior
pole 3.6 x 3.8 x 3.7 cm, simple features. No additional mass or
hydronephrosis. No shadowing calcifications.

Left Kidney:

Renal measurements: 10.5 x 6.0 x 4.8 cm = volume: 159 mL. Cortical
thinning. Upper normal cortical echogenicity. Tiny exophytic cyst at
at inferior kidney 9 x 7 x 8 mm. No additional mass or
hydronephrosis. No shadowing calcifications.

Bladder:

Contains only minimal urine, grossly unremarkable.

Other:

N/A
IMPRESSION: BILATERAL renal cortical atrophy and renal cysts as above.

No worrisome renal mass or hydronephrosis identified.

## 2022-04-06 ENCOUNTER — Other Ambulatory Visit: Payer: Self-pay

## 2022-04-06 MED ORDER — TAMSULOSIN HCL 0.4 MG PO CAPS: 0.4000 mg | ORAL_CAPSULE | Freq: Every day | ORAL | 1 refills | Status: DC

## 2022-04-15 ENCOUNTER — Other Ambulatory Visit: Payer: Self-pay | Admitting: Internal Medicine

## 2022-04-18 NOTE — Telephone Encounter (Signed)
Will refuse due to this med was already sent to [facility] on 04/05/21.   Requested Prescriptions  Pending Prescriptions Disp Refills   tamsulosin (FLOMAX) 0.4 MG CAPS capsule [Pharmacy Med Name: TAMSULOSIN CAP 0.4MG ] 90 capsule 0    Sig: TAKE 1 CAPSULE DAILY AFTER BREAKFAST     Urology: Alpha-Adrenergic Blocker Failed - 04/15/2022  6:01 PM      Failed - PSA in normal range and within 360 days    PSA  Date Value Ref Range Status  01/12/2021 1.34 < OR = 4.00 ng/mL Final    Comment:    The total PSA value from this assay system is  standardized against the WHO standard. The test  result will be approximately 20% lower when compared  to the equimolar-standardized total PSA (Beckman  Coulter). Comparison of serial PSA results should be  interpreted with this fact in mind. . This test was performed using the Siemens  chemiluminescent method. Values obtained from  different assay methods cannot be used interchangeably. PSA levels, regardless of value, should not be interpreted as absolute evidence of the presence or absence of disease.          Failed - Last BP in normal range    BP Readings from Last 1 Encounters:  02/17/22 (!) 174/77         Passed - Valid encounter within last 12 months    Recent Outpatient Visits           2 months ago Type 2 diabetes mellitus with diabetic nephropathy, with long-term current use of insulin (HCC)   [place] [facility] [place], [name], DO   5 months ago Type 2 diabetes mellitus with diabetic nephropathy, with long-term current use of insulin [facility])   [place] [facility] Kat[place] Painter, [name], DO   11 months ago Type 2 diabetes mellitus with diabetic nephropathy, with long-term current use of insulin [facility])   [place] [facility] Althea Charon, [name], DO   1 year ago Annual physical exam   [place] [facility] [name], DO   1 year  ago Type 2 diabetes mellitus with diabetic nephropathy, with long-term current use of insulin [facility])    [facility] [place], [name], DO       Future Appointments             In 2 weeks Althea Charon, [name], DO  [facility], [place]   In 1 month [name], MD [facility]

## 2022-04-19 ENCOUNTER — Encounter: Payer: Self-pay | Admitting: Ophthalmology

## 2022-04-21 NOTE — Anesthesia Preprocedure Evaluation (Addendum)
Anesthesia Evaluation    History of Anesthesia Complications (+) PONV and history of anesthetic complications  Airway Mallampati: IV  TM Distance: <3 FB Neck ROM: Full    Dental   Few chips on upper teeth, very small chip:   Pulmonary    Pulmonary exam normal breath sounds clear to auscultation       Cardiovascular hypertension, + CAD  Normal cardiovascular exam Rhythm:Regular Rate:Normal     Neuro/Psych  Neuromuscular disease    GI/Hepatic ,GERD  ,,Patient reports initial blood sugar this am was 56, he drank apple juice, blood sugar to 70s, and we will recheck now, will administer D50W as needed   Endo/Other  diabetes, Poorly Controlled, Insulin Dependent    Renal/GU Renal disease     Musculoskeletal   Abdominal  (+) + obese  Peds  Hematology  (+) Blood dyscrasia, anemia   Anesthesia Other Findings HTD  CAD (coronary artery disease) HTN (hypertension)  Hearing loss in right ear Hoarseness  AN (acoustic neuroma) History of pancreatitis  Chronic kidney disease Actinic keratosis Diabetes mellitus without complication Right acoustic neuroma  Vertigo Hearing loss in right ear  PONV (postoperative nausea and vom miting)    Reproductive/Obstetrics                             Anesthesia Physical Anesthesia Plan  ASA: 3  Anesthesia Plan: MAC   Post-op Pain Management:    Induction: Intravenous  PONV Risk Score and Plan:   Airway Management Planned: Natural Airway and Nasal Cannula  Additional Equipment:   Intra-op Plan:   Post-operative Plan:   Informed Consent: I have reviewed the patients History and Physical, chart, labs and discussed the procedure including the risks, benefits and alternatives for the proposed anesthesia with the patient or authorized representative who has indicated his/her understanding and acceptance.     Dental Advisory Given  Plan Discussed  with: Anesthesiologist, CRNA and Surgeon  Anesthesia Plan Comments: (Patient consented for risks of anesthesia including but not limited to:  - adverse reactions to medications - damage to eyes, teeth, lips or other oral mucosa - nerve damage due to positioning  - sore throat or hoarseness - Damage to heart, brain, nerves, lungs, other parts of body or loss of life  Patient voiced understanding.)       Anesthesia Quick Evaluation

## 2022-04-25 NOTE — Discharge Instructions (Signed)

## 2022-04-26 ENCOUNTER — Other Ambulatory Visit: Payer: Self-pay

## 2022-04-26 ENCOUNTER — Ambulatory Visit
Admission: RE | Admit: 2022-04-26 | Discharge: 2022-04-26 | Disposition: A | Discharge status: Home / Self Care | Payer: Federal, State, Local not specified - PPO | Attending: Ophthalmology | Admitting: Ophthalmology

## 2022-04-26 ENCOUNTER — Ambulatory Visit: Payer: Federal, State, Local not specified - PPO | Admitting: Anesthesiology

## 2022-04-26 ENCOUNTER — Encounter
Admission: RE | Disposition: A | Discharge status: Home / Self Care | Payer: Self-pay | Source: Home / Self Care | Attending: Ophthalmology

## 2022-04-26 ENCOUNTER — Encounter: Payer: Self-pay | Admitting: Ophthalmology

## 2022-04-26 DIAGNOSIS — I2510 Atherosclerotic heart disease of native coronary artery without angina pectoris: Secondary | ICD-10-CM | POA: Insufficient documentation

## 2022-04-26 DIAGNOSIS — E1136 Type 2 diabetes mellitus with diabetic cataract: Secondary | ICD-10-CM | POA: Insufficient documentation

## 2022-04-26 DIAGNOSIS — E1122 Type 2 diabetes mellitus with diabetic chronic kidney disease: Secondary | ICD-10-CM | POA: Insufficient documentation

## 2022-04-26 DIAGNOSIS — N189 Chronic kidney disease, unspecified: Secondary | ICD-10-CM | POA: Insufficient documentation

## 2022-04-26 DIAGNOSIS — I129 Hypertensive chronic kidney disease with stage 1 through stage 4 chronic kidney disease, or unspecified chronic kidney disease: Secondary | ICD-10-CM | POA: Insufficient documentation

## 2022-04-26 DIAGNOSIS — H2512 Age-related nuclear cataract, left eye: Secondary | ICD-10-CM | POA: Diagnosis present

## 2022-04-26 DIAGNOSIS — K219 Gastro-esophageal reflux disease without esophagitis: Secondary | ICD-10-CM | POA: Diagnosis not present

## 2022-04-26 DIAGNOSIS — Z794 Long term (current) use of insulin: Secondary | ICD-10-CM | POA: Insufficient documentation

## 2022-04-26 HISTORY — PX: CATARACT EXTRACTION W/PHACO: SHX586

## 2022-04-26 HISTORY — DX: Other specified postprocedural states: R11.2

## 2022-04-26 HISTORY — DX: Other specified postprocedural states: Z98.890

## 2022-04-26 HISTORY — DX: Nausea with vomiting, unspecified: R11.2

## 2022-04-26 HISTORY — DX: Dizziness and giddiness: R42

## 2022-04-26 LAB — GLUCOSE, CAPILLARY: Glucose-Capillary: 108 mg/dL — ABNORMAL HIGH (ref 70–99)

## 2022-04-26 SURGERY — PHACOEMULSIFICATION, CATARACT, WITH IOL INSERTION
Anesthesia: Monitor Anesthesia Care | Site: Eye | Laterality: Left

## 2022-04-26 MED ORDER — BRIMONIDINE TARTRATE-TIMOLOL 0.2-0.5 % OP SOLN
OPHTHALMIC | Status: DC | PRN
  Administered 2022-04-26: 1 [drp] via OPHTHALMIC

## 2022-04-26 MED ORDER — LACTATED RINGERS IV SOLN: INTRAVENOUS | Status: DC

## 2022-04-26 MED ORDER — SIGHTPATH DOSE#1 BSS IO SOLN
INTRAOCULAR | Status: DC | PRN
  Administered 2022-04-26: 73 mL via OPHTHALMIC

## 2022-04-26 MED ORDER — ARMC OPHTHALMIC DILATING DROPS
1.0000 | OPHTHALMIC | Status: DC | PRN
  Administered 2022-04-26 (×3): 1 via OPHTHALMIC

## 2022-04-26 MED ORDER — SIGHTPATH DOSE#1 BSS IO SOLN
INTRAOCULAR | Status: DC | PRN
  Administered 2022-04-26: 15 mL

## 2022-04-26 MED ORDER — MIDAZOLAM HCL 2 MG/2ML IJ SOLN
INTRAMUSCULAR | Status: DC | PRN
  Administered 2022-04-26: 1 mg via INTRAVENOUS

## 2022-04-26 MED ORDER — MOXIFLOXACIN HCL 0.5 % OP SOLN
OPHTHALMIC | Status: DC | PRN
  Administered 2022-04-26: .2 mL via OPHTHALMIC

## 2022-04-26 MED ORDER — TETRACAINE HCL 0.5 % OP SOLN
1.0000 [drp] | OPHTHALMIC | Status: DC | PRN
  Administered 2022-04-26 (×3): 1 [drp] via OPHTHALMIC

## 2022-04-26 MED ORDER — SIGHTPATH DOSE#1 NA CHONDROIT SULF-NA HYALURON 40-17 MG/ML IO SOLN
INTRAOCULAR | Status: DC | PRN
  Administered 2022-04-26: 1 mL via INTRAOCULAR

## 2022-04-26 MED ORDER — FENTANYL CITRATE (PF) 100 MCG/2ML IJ SOLN
INTRAMUSCULAR | Status: DC | PRN
  Administered 2022-04-26: 50 ug via INTRAVENOUS

## 2022-04-26 MED ORDER — SIGHTPATH DOSE#1 BSS IO SOLN
INTRAOCULAR | Status: DC | PRN
  Administered 2022-04-26: 1 mL via INTRAMUSCULAR

## 2022-04-26 SURGICAL SUPPLY — 8 items
CATARACT SUITE SIGHTPATH (MISCELLANEOUS) ×1 IMPLANT
FEE CATARACT SUITE SIGHTPATH (MISCELLANEOUS) ×2 IMPLANT
GLOVE BIOGEL PI IND STRL 8 (GLOVE) ×2 IMPLANT
GLOVE SURG ENC TEXT LTX SZ8 (GLOVE) ×2 IMPLANT
LENS IOL TECNIS EYHANCE 20.5 (Intraocular Lens) IMPLANT
NDL FILTER BLUNT 18X1 1/2 (NEEDLE) ×2 IMPLANT
NEEDLE FILTER BLUNT 18X1 1/2 (NEEDLE) ×1 IMPLANT
SYR 3ML LL SCALE MARK (SYRINGE) ×2 IMPLANT

## 2022-04-26 NOTE — Transfer of Care (Signed)
Immediate Anesthesia Transfer of Care Note  Patient: [name]  Procedure(s) Performed: CATARACT EXTRACTION PHACO AND INTRAOCULAR LENS PLACEMENT (IOC) LEFT DIABETIC  13.21  01:17.4 (Left: Eye)  Patient Location: PACU  Anesthesia Type: MAC  Level of Consciousness: awake, alert  and patient cooperative  Airway and Oxygen Therapy: Patient Spontanous Breathing and Patient connected to supplemental oxygen  Post-op Assessment: Post-op Vital signs reviewed, Patient's Cardiovascular Status Stable, Respiratory Function Stable, Patent Airway and No signs of Nausea or vomiting  Post-op Vital Signs: Reviewed and stable  Complications: No notable events documented.

## 2022-04-26 NOTE — Anesthesia Postprocedure Evaluation (Signed)
Anesthesia Post Note  Patient: [name]  Procedure(s) Performed: CATARACT EXTRACTION PHACO AND INTRAOCULAR LENS PLACEMENT (IOC) LEFT DIABETIC  13.21  01:17.4 (Left: Eye)  Patient location during evaluation: PACU Anesthesia Type: MAC Level of consciousness: awake and alert Pain management: pain level controlled Vital Signs Assessment: post-procedure vital signs reviewed and stable Respiratory status: spontaneous breathing, nonlabored ventilation, respiratory function stable and patient connected to nasal cannula oxygen Cardiovascular status: stable and blood pressure returned to baseline Postop Assessment: no apparent nausea or vomiting Anesthetic complications: no   No notable events documented.   Last Vitals:  Vitals:   04/26/22 0911 04/26/22 0916  BP: (!) 106/56 (!) 108/59  Pulse: 67 65  Resp: 16 14  Temp: (!) 36.3 C (!) 36.3 C  SpO2: 94% 94%    Last Pain:  Vitals:   04/26/22 0916  TempSrc:   PainSc: 0-No pain                 Marisue Humble

## 2022-04-26 NOTE — H&P (Signed)
[facility]   Primary Care Physician:  [name], DO Ophthalmologist: Dr. Druscilla Brownie  Pre-Procedure History & Physical: HPI:  [name] is a 77 y.o. male here for cataract surgery.   Past Medical History:  Diagnosis Date   Actinic keratosis    AN (acoustic neuroma) 2012   by Karis Juba   CAD (coronary artery disease)    multivessel diffuse disease   Chronic kidney disease    3   Diabetes mellitus without complication    Hearing loss in right ear    Hearing loss in right ear    due to H/O acoustic neuroma   History of pancreatitis 1996   idiopathic   HLD (hyperlipidemia)    Hoarseness    HTN (hypertension)    PONV (postoperative nausea and vomiting)    after CTS   Right acoustic neuroma 2016   treated with radiation   Vertigo    due to H/O right acoustic neuroma    Past Surgical History:  Procedure Laterality Date   BACK SURGERY     BASAL CELL CARCINOMA EXCISION  2013   removed from right side of face   CARPAL TUNNEL RELEASE     CORONARY ANGIOPLASTY WITH STENT PLACEMENT     CORONARY ARTERY BYPASS GRAFT  NOV 2007   x5   cyst removal of right hand     ruptured disk surgery     neck     Prior to Admission medications   Medication Sig Start Date End Date Taking? Authorizing Provider  acetaminophen (TYLENOL) 500 MG tablet Take 500 mg by mouth every 6 (six) hours as needed.   Yes [provider]  allopurinol (ZYLOPRIM) 100 MG tablet TAKE 1 TABLET DAILY 09/03/21  Yes Karamalegos, [name], DO  aspirin 325 MG tablet Take 325 mg by mouth daily.   Yes [provider]  atorvastatin (LIPITOR) 20 MG tablet Take 1 tablet (20 mg total) by mouth at bedtime. 01/26/22  Yes Karamalegos, [name], DO  carvedilol (COREG) 25 MG tablet Take 12.5 mg by mouth daily. 01/19/21  Yes Karamalegos, [name], DO  Cholecalciferol 125 MCG (5000 UT) TABS Take 5,000 Units by mouth daily.   Yes [provider]  cyanocobalamin (VITAMIN  B12) 1000 MCG tablet Take 3,000 mcg by mouth daily.   Yes [provider]  empagliflozin (JARDIANCE) 10 MG TABS tablet Take 10 mg by mouth daily.   Yes [provider]  esomeprazole (NEXIUM) 20 MG capsule Take 1 capsule (20 mg total) by mouth daily before breakfast. 01/19/21  Yes Karamalegos, [name], DO  glyBURIDE (DIABETA) 5 MG tablet Take 1 tablet (5 mg total) by mouth 2 (two) times daily with a meal. 01/26/22  Yes Karamalegos, Alexander J, DO  insulin degludec (TRESIBA) 200 UNIT/ML FlexTouch Pen Inject 74 Units into the skin daily. 02/15/21  Yes [provider]  insulin lispro (HUMALOG) 100 UNIT/ML KwikPen 21 Units 3 (three) times daily. + sliding scale 11/26/18  Yes [provider]  Iodoquinol-Hydrocortisone-Aloe 1-1.9 % CREA Apply 1 Application topically every other day.   Yes [provider]  Magnesium 250 MG TABS Take 1 tablet by mouth daily.   Yes [provider]  Multiple Vitamin (MULTI-VITAMIN) tablet Take 1 tablet by mouth daily.   Yes [provider]  tamsulosin (FLOMAX) 0.4 MG CAPS capsule Take 1 capsule (0.4 mg total) by mouth daily. 04/06/22  Yes Karamalegos, [name], DO  telmisartan-hydrochlorothiazide (MICARDIS HCT) 80-25 MG tablet  Take 0.5 tablets by mouth daily. 04/10/19 04/19/22 Yes [provider]  torsemide (DEMADEX) 20 MG tablet Take 40 mg by mouth daily. 10/18/21 10/18/22 Yes [provider]  colchicine 0.6 MG tablet Take 2 tablets (1.2mg ) by mouth at first sign of gout flare followed by 1 tablet (0.6mg ) after 1 hour. (Max 1.8mg  within 1 hour) Patient not taking: Reported on 04/19/2022 09/17/18   [provider]  Continuous Blood Gluc Receiver (FREESTYLE LIBRE 2 READER) DEVI Use to monitor blood glucose. 05/10/21   [provider]  Continuous Blood Gluc Sensor (FREESTYLE LIBRE 2 SENSOR) MISC  12/22/21   [provider]  hydrocortisone valerate cream (WESTCORT) 0.2 % Apply 1  application topically 2 (two) times daily. 07/13/20   Karamalegos, [name], DO  Insulin Pen Needle (BD PEN NEEDLE NANO 2ND GEN) 32G X 4 MM MISC 4 shots per day 11/26/18   [provider]  ketoconazole (NIZORAL) 2 % cream Apply to the face qhs M/W/F 06/02/21   [name], MD  Lancets (ACCU-CHEK SOFT [facility]) lancets Use as instructed 10/14/11   Sherlene Shams, MD    Allergies as of 04/13/2022 - Review Complete 02/17/2022  Allergen Reaction Noted   Codeine  07/14/2009   Pneumococcal vaccines Hives 07/03/2014    Family History  Problem Relation Age of Onset   Heart disease Mother    Hyperlipidemia Mother    Hypertension Mother    Heart failure Mother    Diabetes Father    Heart attack Father    Colon cancer Sister 101   Diabetes Sister    Diabetes Brother    Coronary artery disease Brother    Crohn's disease Brother    Melanoma Brother    Diabetes Brother    Coronary artery disease Other    Diabetes Other    Cancer Other     Social History   Socioeconomic History   Marital status: Married    Spouse name: Not on file   Number of children: Not on file   Years of education: Not on file   Highest education level: Not on file  Occupational History   Not on file  Tobacco Use   Smoking status: Never   Smokeless tobacco: Never   Tobacco comments:    tobacco use- no   Vaping Use   Vaping Use: Never used  Substance and Sexual Activity   Alcohol use: Never   Drug use: No   Sexual activity: Not on file  Other Topics Concern   Not on file  Social History Narrative   Does not regularly exercise. Works part time at [employer] 2-3 hours at night.    Social Determinants of Health   Financial Resource Strain: Not on file  Food Insecurity: Not on file  Transportation Needs: Not on file  Physical Activity: Not on file  Stress: Not on file  Social Connections: Not on file  Intimate Partner Violence: Not on file    Review of Systems: See HPI, otherwise  negative ROS  Physical Exam: BP (!) 122/57   Pulse 77   Temp 97.6 F (36.4 C) (Tympanic)   Ht 5' 5.98" (1.676 m)   Wt 112.4 kg   SpO2 97%   BMI 40.03 kg/m  General:   Alert, cooperative in NAD Head:  Normocephalic and atraumatic. Respiratory:  Normal work of breathing. Cardiovascular:  RRR  Impression/Plan: DINERO CHAVIRA is here for cataract surgery.  Risks, benefits, limitations, and alternatives regarding cataract surgery have  been reviewed with the patient.  Questions have been answered.  All parties agreeable.   Galen Manila, MD  04/26/2022, 8:47 AM

## 2022-04-26 NOTE — Op Note (Signed)
PREOPERATIVE DIAGNOSIS:  Nuclear sclerotic cataract of the left eye.   POSTOPERATIVE DIAGNOSIS:  Nuclear sclerotic cataract of the left eye.   OPERATIVE PROCEDURE:ORPROCALL@   SURGEON:  Galen Manila, MD.   ANESTHESIA:  Anesthesiologist: Marisue Humble, MD CRNA: Andee Poles, CRNA  1.      Managed anesthesia care. 2.     0.66ml of Shugarcaine was instilled following the paracentesis   COMPLICATIONS:  None.   TECHNIQUE:   Stop and chop   DESCRIPTION OF PROCEDURE:  The patient was examined and consented in the preoperative holding area where the aforementioned topical anesthesia was applied to the left eye and then brought back to the Operating Room where the left eye was prepped and draped in the usual sterile ophthalmic fashion and a lid speculum was placed. A paracentesis was created with the side port blade and the anterior chamber was filled with viscoelastic. A near clear corneal incision was performed with the steel keratome. A continuous curvilinear capsulorrhexis was performed with a cystotome followed by the capsulorrhexis forceps. Hydrodissection and hydrodelineation were carried out with BSS on a blunt cannula. The lens was removed in a stop and chop  technique and the remaining cortical material was removed with the irrigation-aspiration handpiece. The capsular bag was inflated with viscoelastic and the Technis [id number] lens was placed in the capsular bag without complication. The remaining viscoelastic was removed from the eye with the irrigation-aspiration handpiece. The wounds were hydrated. The anterior chamber was flushed with BSS and the eye was inflated to physiologic pressure. 0.21ml Vigamox was placed in the anterior chamber. The wounds were found to be water tight. The eye was dressed with Combigan. The patient was given protective glasses to wear throughout the day and a shield with which to sleep tonight. The patient was also given drops with which to begin a drop regimen  today and will follow-up with me in one day. Implant Name Type Inv. Item Serial No. Manufacturer Lot No. LRB No. Used Action  LENS IOL TECNIS EYHANCE 20.5 - [id number] Intraocular Lens LENS IOL TECNIS EYHANCE 20.5 [id number] [employer]  Left 1 Implanted    Procedure(s) with comments: CATARACT EXTRACTION PHACO AND INTRAOCULAR LENS PLACEMENT (IOC) LEFT DIABETIC  13.21  01:17.4 (Left) - Diabetic  Electronically signed: Galen Manila 04/26/2022 9:09 AM

## 2022-04-27 ENCOUNTER — Other Ambulatory Visit: Payer: Federal, State, Local not specified - PPO

## 2022-04-27 ENCOUNTER — Encounter: Payer: Self-pay | Admitting: Ophthalmology

## 2022-04-27 DIAGNOSIS — I25810 Atherosclerosis of coronary artery bypass graft(s) without angina pectoris: Secondary | ICD-10-CM

## 2022-04-27 DIAGNOSIS — Z0000 Encounter for general adult medical examination without abnormal findings: Secondary | ICD-10-CM

## 2022-04-27 DIAGNOSIS — N183 Chronic kidney disease, stage 3 unspecified: Secondary | ICD-10-CM

## 2022-04-27 DIAGNOSIS — Z794 Long term (current) use of insulin: Secondary | ICD-10-CM

## 2022-04-27 DIAGNOSIS — E1121 Type 2 diabetes mellitus with diabetic nephropathy: Secondary | ICD-10-CM

## 2022-04-27 DIAGNOSIS — M1A0790 Idiopathic chronic gout, unspecified ankle and foot, without tophus (tophi): Secondary | ICD-10-CM

## 2022-04-27 DIAGNOSIS — E1169 Type 2 diabetes mellitus with other specified complication: Secondary | ICD-10-CM

## 2022-04-27 DIAGNOSIS — N400 Enlarged prostate without lower urinary tract symptoms: Secondary | ICD-10-CM

## 2022-04-27 LAB — CBC WITH DIFFERENTIAL/PLATELET
Eosinophils Relative: 4.5 %
MCV: 98.1 fL (ref 80.0–100.0)
RBC: 3.14 10*6/uL — ABNORMAL LOW (ref 4.20–5.80)

## 2022-04-28 LAB — COMPLETE METABOLIC PANEL WITH GFR
AG Ratio: 1.6 (calc) (ref 1.0–2.5)
ALT: 14 U/L (ref 9–46)
AST: 14 U/L (ref 10–35)
Albumin: 3.9 g/dL (ref 3.6–5.1)
Alkaline phosphatase (APISO): 85 U/L (ref 35–144)
BUN/Creatinine Ratio: 17 (calc) (ref 6–22)
BUN: 38 mg/dL — ABNORMAL HIGH (ref 7–25)
CO2: 30 mmol/L (ref 20–32)
Calcium: 9.2 mg/dL (ref 8.6–10.3)
Chloride: 102 mmol/L (ref 98–110)
Creat: 2.2 mg/dL — ABNORMAL HIGH (ref 0.70–1.28)
Globulin: 2.5 g/dL (calc) (ref 1.9–3.7)
Glucose, Bld: 115 mg/dL — ABNORMAL HIGH (ref 65–99)
Potassium: 4.1 mmol/L (ref 3.5–5.3)
Sodium: 142 mmol/L (ref 135–146)
Total Bilirubin: 0.3 mg/dL (ref 0.2–1.2)
Total Protein: 6.4 g/dL (ref 6.1–8.1)
eGFR: 30 mL/min/{1.73_m2} — ABNORMAL LOW (ref >=60)

## 2022-04-28 LAB — LIPID PANEL
Cholesterol: 150 mg/dL (ref <200)
HDL: 31 mg/dL — ABNORMAL LOW (ref >=40)
Non-HDL Cholesterol (Calc): 119 mg/dL (calc) (ref <130)
Total CHOL/HDL Ratio: 4.8 (calc) (ref <5.0)
Triglycerides: 478 mg/dL — ABNORMAL HIGH (ref <150)

## 2022-04-28 LAB — CBC WITH DIFFERENTIAL/PLATELET
Absolute Monocytes: 736 cells/uL (ref 200–950)
Basophils Absolute: 40 cells/uL (ref 0–200)
Basophils Relative: 0.5 %
Eosinophils Absolute: 360 cells/uL (ref 15–500)
HCT: 30.8 % — ABNORMAL LOW (ref 38.5–50.0)
Hemoglobin: 10.5 g/dL — ABNORMAL LOW (ref 13.2–17.1)
Lymphs Abs: 2976 cells/uL (ref 850–3900)
MCH: 33.4 pg — ABNORMAL HIGH (ref 27.0–33.0)
MCHC: 34.1 g/dL (ref 32.0–36.0)
MPV: 11.9 fL (ref 7.5–12.5)
Monocytes Relative: 9.2 %
Neutro Abs: 3888 cells/uL (ref 1500–7800)
Neutrophils Relative %: 48.6 %
Platelets: 222 10*3/uL (ref 140–400)
RDW: 13.3 % (ref 11.0–15.0)
Total Lymphocyte: 37.2 %
WBC: 8 10*3/uL (ref 3.8–10.8)

## 2022-04-28 LAB — HEMOGLOBIN A1C
Hgb A1c MFr Bld: 7.3 % of total Hgb — ABNORMAL HIGH (ref <5.7)
Mean Plasma Glucose: 163 mg/dL
eAG (mmol/L): 9 mmol/L

## 2022-04-28 LAB — TSH: TSH: 0.44 mIU/L (ref 0.40–4.50)

## 2022-04-28 LAB — URIC ACID: Uric Acid, Serum: 7.8 mg/dL (ref 4.0–8.0)

## 2022-04-28 LAB — PSA: PSA: 1.31 ng/mL (ref <=4.00)

## 2022-05-02 ENCOUNTER — Encounter: Payer: Self-pay | Admitting: Ophthalmology

## 2022-05-02 NOTE — Anesthesia Preprocedure Evaluation (Addendum)
Anesthesia Evaluation  Patient identified by MRN, date of birth, ID band Patient awake    Reviewed: Allergy & Precautions, H&P , NPO status , Patient's Chart, lab work & pertinent test results, reviewed documented beta blocker date and time   History of Anesthesia Complications (+) PONV and history of anesthetic complications  Airway Mallampati: IV  TM Distance: <3 FB Neck ROM: Full    Dental no notable dental hx.    Pulmonary neg pulmonary ROS Wife says patient snores very loudly, patient reports severe daytime drowsiness, "I can sleep sitting up in a chair,"  patient is male, 76 years, HTN, CAD, obese, thick neck, extremely high risk for sleep apnea. Discussed with patient and his wife, the risk factors for sleep apnea and risks of untreated sleep apnea, strongly urged sleep study. Strongly believe patient has sleep apnea that will need treatment.   Pulmonary exam normal breath sounds clear to auscultation       Cardiovascular hypertension, Pt. on home beta blockers and Pt. on medications + CAD  Normal cardiovascular exam Rhythm:Regular Rate:Normal     Neuro/Psych  Neuromuscular disease  negative psych ROS   GI/Hepatic negative GI ROS, Neg liver ROS,GERD  ,,  Endo/Other  diabetes, Type 2, Insulin Dependent    Renal/GU Renal disease  negative genitourinary   Musculoskeletal negative musculoskeletal ROS (+)    Abdominal   Peds negative pediatric ROS (+)  Hematology negative hematology ROS (+) Blood dyscrasia, anemia   Anesthesia Other Findings HLD (hyperlipidemia)  CAD (coronary artery disease) HTN (hypertension)  Hearing loss in right ear Hoarseness  AN (acoustic neuroma) (HCC) History of pancreatitis  Chronic kidney disease Actinic keratosis  Diabetes mellitus without complication (HCC) Right acoustic neuroma (HCC) Vertigo Hearing loss in right ear  PONV (postoperative nausea and vomiting) Gout      Reproductive/Obstetrics negative OB ROS                             Anesthesia Physical Anesthesia Plan  ASA: 3  Anesthesia Plan: MAC   Post-op Pain Management:    Induction: Intravenous  PONV Risk Score and Plan:   Airway Management Planned: Natural Airway and Nasal Cannula  Additional Equipment:   Intra-op Plan:   Post-operative Plan:   Informed Consent: I have reviewed the patients History and Physical, chart, labs and discussed the procedure including the risks, benefits and alternatives for the proposed anesthesia with the patient or authorized representative who has indicated his/her understanding and acceptance.     Dental Advisory Given  Plan Discussed with: Anesthesiologist, CRNA and Surgeon  Anesthesia Plan Comments: (Patient consented for risks of anesthesia including but not limited to:  - adverse reactions to medications - damage to eyes, teeth, lips or other oral mucosa - nerve damage due to positioning  - sore throat or hoarseness - Damage to heart, brain, nerves, lungs, other parts of body or loss of life  Patient voiced understanding.)       Anesthesia Quick Evaluation

## 2022-05-04 ENCOUNTER — Encounter: Payer: Federal, State, Local not specified - PPO | Admitting: Family Medicine

## 2022-05-05 ENCOUNTER — Encounter: Payer: Self-pay | Admitting: Family Medicine

## 2022-05-05 ENCOUNTER — Ambulatory Visit (INDEPENDENT_AMBULATORY_CARE_PROVIDER_SITE_OTHER): Payer: Federal, State, Local not specified - PPO | Admitting: Family Medicine

## 2022-05-05 VITALS — BP 112/62 | HR 78 | Ht 65.0 in | Wt 249.0 lb

## 2022-05-05 DIAGNOSIS — E1121 Type 2 diabetes mellitus with diabetic nephropathy: Secondary | ICD-10-CM | POA: Diagnosis not present

## 2022-05-05 DIAGNOSIS — Z794 Long term (current) use of insulin: Secondary | ICD-10-CM

## 2022-05-05 DIAGNOSIS — Z0000 Encounter for general adult medical examination without abnormal findings: Secondary | ICD-10-CM | POA: Diagnosis not present

## 2022-05-05 DIAGNOSIS — N400 Enlarged prostate without lower urinary tract symptoms: Secondary | ICD-10-CM

## 2022-05-05 DIAGNOSIS — I25810 Atherosclerosis of coronary artery bypass graft(s) without angina pectoris: Secondary | ICD-10-CM

## 2022-05-05 DIAGNOSIS — N183 Chronic kidney disease, stage 3 unspecified: Secondary | ICD-10-CM

## 2022-05-05 DIAGNOSIS — E1169 Type 2 diabetes mellitus with other specified complication: Secondary | ICD-10-CM

## 2022-05-05 DIAGNOSIS — I129 Hypertensive chronic kidney disease with stage 1 through stage 4 chronic kidney disease, or unspecified chronic kidney disease: Secondary | ICD-10-CM

## 2022-05-05 DIAGNOSIS — M1A0790 Idiopathic chronic gout, unspecified ankle and foot, without tophus (tophi): Secondary | ICD-10-CM

## 2022-05-05 DIAGNOSIS — E785 Hyperlipidemia, unspecified: Secondary | ICD-10-CM

## 2022-05-05 MED ORDER — GVOKE HYPOPEN 2-PACK 1 MG/0.2ML ~~LOC~~ SOAJ
1.0000 mg | SUBCUTANEOUS | 3 refills | Status: AC | PRN
Start: 2022-05-05 — End: ?

## 2022-05-05 NOTE — Assessment & Plan Note (Addendum)
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 Continue current BP HALF tabs of Carvedilol 25mg  = 12.5mg  BID, Telmisartan-HCTZ 80-25mg  (HALF tab) daily - Torsemide 40mg  daily Consider swap Torsemide 40 to Furosemide prior to upcoming Nephrology apt last few weeks prior to apt to discuss  2. Encourage improved lifestyle - low sodium diet, regular exercise 3. Continue monitor BP outside office, bring readings to next visit, if persistently >140/90 or new symptoms notify office sooner

## 2022-05-05 NOTE — Assessment & Plan Note (Signed)
Controlled cholesterol on statin  lifestyle CAD, PAD  Plan: 1. Continue current meds - Atorvastatin 20mg daily 2. On ASA 325 3 Encourage improved lifestyle - low carb/cholesterol, reduce portion size, continue improving regular exercise 

## 2022-05-05 NOTE — Patient Instructions (Addendum)
Thank you for coming to the office today.  Stop Glyburide, see how you do off of it. My hope is that you'll have very few if any low sugars, and if the A1c stays in 7 range we are good.  Use GVOKE emergency pen if glucose 54 or less or symptoms hypoglycemia. Instead of oral glucose intake.  Recent Labs    10/26/21 0828 01/26/22 0818 04/27/22 1329  HGBA1C 7.4* 6.9* 7.3*   Keep on Torsemide 20mg  x 2 = 40mg  daily per Kidney doctor request  I would suggest if you want to trial the Furosemide INSTEAD take this instead of torsemide last few weeks before your next apt.  Discuss with them about which med to choose going forward.  Keep on Tylenol regimen.   Please schedule a Follow-up Appointment to: Return in about 6 months (around 11/05/2022) for 6 month DM A1c update, nephrology / cards.  If you have any other questions or concerns, please feel free to call the office or send a message through MyChart. You may also schedule an earlier appointment if necessary.  Additionally, you may be receiving a survey about your experience at our office within a few days to 1 week by e-mail or mail. We value your feedback.  Saralyn Pilar, DO [facility], [place]

## 2022-05-05 NOTE — Assessment & Plan Note (Signed)
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

## 2022-05-05 NOTE — Assessment & Plan Note (Addendum)
Well-controlled DM with A1c 7.3 improved control on insulin regimen per Endocrinology Complications - CKD III, Hyperlipidemia, GERD, obesity, CAD -  increases risk of future cardiovascular complications   Complication HYPOGLYCEMIA with decline in renal function  Off Metformin due to CKD  Plan:  1. Again advise to DISCONTINUE Glyburide due to hypoglycemia events. - Continue current therapy -  Tresiba, Lispro adjusting doses per Endocrinology  2. Encourage improved lifestyle - low carb, low sugar diet, reduce portion size, continue improving regular exercise 3. Check CBG , bring log to next visit for review 4. Continue ASA,  ARB, Statin  Use GVOKE emergency pen if glucose 54 or less or symptoms hypoglycemia. Instead of oral glucose intake.

## 2022-05-05 NOTE — Progress Notes (Signed)
Subjective:    Patient ID: [name], male    DOB: 09-14-1945, 77 y.o.   MRN: [id number]  [name] is a 77 y.o. male presenting on 05/05/2022 for Annual Exam   HPI  Specialists Nephrology - Dr Mosetta Pigeon [facility]) Endocrinology - Dr Verdis Frederickson [facility]) Cardiology - Dr Arnoldo Hooker [facility])   CHRONIC DM, Type 2: Chronic problem.  Followed by [facility] Endocrinology Dr Gershon Crane A1c 7.3 CGM Back on Jardiance per Renal Managed on Basal insulin Tresiba and Humalog mealtime, also Glyburide per Endocrine   He has self managed his dosing with Basal Tresiba down 60 range, and Humalog 10-12 range - he still takes Glyburide 5mg  x 2 = 10mg  TWICE A DAY with meals, he acknowledges it was discontinued but he is finishing it currently OFF Metformin  He does admit to overnight hypoglycemia, resolved with oral glucose intake He is using CGM and has had had a much better handle on his sugar control overall.   Reports  good compliance. 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 Asks about swap back to Furosemide, has swelling    Bilateral Shoulder pain Osteoarthritis multiple joints   Switched from Ibuprofen to Tylenol, doing well with this.     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 Recent result with Uric Acid 8.0 01/2021, he is on Allopurinol 100mg  daily, max dose for renal   GERD Chronic problem On  esomeprazole 20mg  daily  Next Cataract surgery coming up 5/7, previous 4/23      05/05/2022    8:38 AM 01/31/2022    1:32 PM 01/26/2022    8:22 AM  Depression screen PHQ 2/9  Decreased Interest 0 0 0  Down, Depressed, Hopeless 0 0 0  PHQ - 2 Score 0 0 0  Altered sleeping   1  Tired, decreased energy   0  Change in appetite   1  Feeling bad or failure about yourself    0  Trouble concentrating   0  Moving slowly or fidgety/restless   0  Suicidal thoughts   0  PHQ-9 Score   2  Difficult doing work/chores   Not difficult at all    Past Medical History:  Diagnosis Date   Actinic keratosis    AN (acoustic neuroma) (HCC) 2012   by Karis Juba   CAD (coronary artery disease)    multivessel diffuse disease   Chronic kidney disease    3   Diabetes mellitus without complication (HCC)    Gout    Hearing loss in right ear    Hearing loss in right ear    due to H/O acoustic neuroma   History of pancreatitis 1996   idiopathic   HLD (hyperlipidemia)    Hoarseness    HTN (hypertension)    PONV (postoperative nausea and vomiting)    after CTS   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   Vertigo    due  to H/O right acoustic neuroma   Past Surgical History:  Procedure Laterality Date   BACK SURGERY     BASAL CELL CARCINOMA EXCISION  2013   removed from right side of face   CARPAL TUNNEL RELEASE     CATARACT EXTRACTION W/PHACO Left 04/26/2022   Procedure: CATARACT EXTRACTION PHACO AND INTRAOCULAR LENS PLACEMENT (IOC) LEFT DIABETIC  13.21  01:17.4;  Surgeon: Galen Manila, MD;  Location: [facility];  Service: Ophthalmology;  Laterality: Left;  Diabetic   CORONARY ANGIOPLASTY WITH STENT PLACEMENT     CORONARY ARTERY BYPASS GRAFT  NOV 2007   x5   cyst removal of right hand     ruptured disk surgery     neck    Social History   Socioeconomic History   Marital status: Married    Spouse name: Not on file   Number of children: Not on file    Years of education: Not on file   Highest education level: Not on file  Occupational History   Not on file  Tobacco Use   Smoking status: Never   Smokeless tobacco: Never   Tobacco comments:    tobacco use- no   Vaping Use   Vaping Use: Never used  Substance and Sexual Activity   Alcohol use: Never   Drug use: No   Sexual activity: Not on file  Other Topics Concern   Not on file  Social History Narrative   Does not regularly exercise. Works part time at [employer] 2-3 hours at night.    Social Determinants of Health   Financial Resource Strain: Not on file  Food Insecurity: Not on file  Transportation Needs: Not on file  Physical Activity: Not on file  Stress: Not on file  Social Connections: Not on file  Intimate Partner Violence: Not on file   Family History  Problem Relation Age of Onset   Heart disease Mother    Hyperlipidemia Mother    Hypertension Mother    Heart failure Mother    Diabetes Father    Heart attack Father    Colon cancer Sister 79   Diabetes Sister    Diabetes Brother    Coronary artery disease Brother    Crohn's disease Brother    Melanoma Brother    Diabetes Brother    Coronary artery disease Other    Diabetes Other    Cancer Other    Current Outpatient Medications on File Prior to Visit  Medication Sig   acetaminophen (TYLENOL) 500 MG tablet Take 500 mg by mouth every 6 (six) hours as needed.   allopurinol (ZYLOPRIM) 100 MG tablet TAKE 1 TABLET DAILY   aspirin 325 MG tablet Take 325 mg by mouth daily.   atorvastatin (LIPITOR) 20 MG tablet Take 1 tablet (20 mg total) by mouth at bedtime.   carvedilol (COREG) 25 MG tablet Take 25 mg by mouth as directed. Take half tablet twice daily   Cholecalciferol 125 MCG (5000 UT) TABS Take 5,000 Units by mouth daily.   Continuous Blood Gluc Receiver (FREESTYLE LIBRE 2 READER) DEVI Use to monitor blood glucose.   Continuous Blood Gluc Sensor (FREESTYLE LIBRE 2 SENSOR) MISC    cyanocobalamin (VITAMIN  B12) 1000 MCG tablet Take 3,000 mcg by mouth daily.   empagliflozin (JARDIANCE) 10 MG TABS tablet Take 10 mg by mouth daily.   esomeprazole (NEXIUM) 20 MG capsule Take 1 capsule (20 mg total) by mouth daily before breakfast.   hydrocortisone valerate cream (WESTCORT) 0.2 %  Apply 1 application topically 2 (two) times daily.   insulin degludec (TRESIBA) 200 UNIT/ML FlexTouch Pen Inject 70 Units into the skin daily.   insulin lispro (HUMALOG) 100 UNIT/ML KwikPen 21 Units 3 (three) times daily. + sliding scale   Insulin Pen Needle (BD PEN NEEDLE NANO 2ND GEN) 32G X 4 MM MISC 4 shots per day   Iodoquinol-Hydrocortisone-Aloe 1-1.9 % CREA Apply 1 Application topically every other day.   ketoconazole (NIZORAL) 2 % cream Apply to the face qhs M/W/F   Lancets (ACCU-CHEK SOFT TOUCH) lancets Use as instructed   Magnesium 250 MG TABS Take 1 tablet by mouth daily.   Multiple Vitamin (MULTI-VITAMIN) tablet Take 1 tablet by mouth daily.   tamsulosin (FLOMAX) 0.4 MG CAPS capsule Take 1 capsule (0.4 mg total) by mouth daily.   telmisartan-hydrochlorothiazide (MICARDIS HCT) 80-25 MG tablet Take 0.5 tablets by mouth daily.   torsemide (DEMADEX) 20 MG tablet Take 40 mg by mouth daily.   No current facility-administered medications on file prior to visit.    Review of Systems  Constitutional:  Negative for activity change, appetite change, chills, diaphoresis, fatigue and fever.  HENT:  Negative for congestion and hearing loss.   Eyes:  Negative for visual disturbance.  Respiratory:  Negative for cough, chest tightness, shortness of breath and wheezing.   Cardiovascular:  Negative for chest pain, palpitations and leg swelling.  Gastrointestinal:  Negative for abdominal pain, constipation, diarrhea, nausea and vomiting.  Genitourinary:  Negative for dysuria, frequency and hematuria.  Musculoskeletal:  Negative for arthralgias and neck pain.  Skin:  Negative for rash.  Neurological:  Negative for dizziness,  weakness, light-headedness, numbness and headaches.  Hematological:  Negative for adenopathy.  Psychiatric/Behavioral:  Negative for behavioral problems, dysphoric mood and sleep disturbance.    Per HPI unless specifically indicated above      Objective:    BP 112/62   Pulse 78   Ht 5\' 5"  (1.651 m)   Wt 249 lb (112.9 kg)   SpO2 97%   BMI 41.44 kg/m   Wt Readings from Last 3 Encounters:  05/05/22 249 lb (112.9 kg)  04/26/22 247 lb 14.4 oz (112.4 kg)  01/31/22 245 lb 6.4 oz (111.3 kg)    Physical Exam Vitals and nursing note reviewed.  Constitutional:      General: He is not in acute distress.    Appearance: He is well-developed. He is obese. He is not diaphoretic.     Comments: Well-appearing, comfortable, cooperative  HENT:     Head: Normocephalic and atraumatic.  Eyes:     General:        Right eye: No discharge.        Left eye: No discharge.     Conjunctiva/sclera: Conjunctivae normal.     Pupils: Pupils are equal, round, and reactive to light.  Neck:     Thyroid: No thyromegaly.  Cardiovascular:     Rate and Rhythm: Normal rate and regular rhythm.     Pulses: Normal pulses.     Heart sounds: Normal heart sounds. No murmur heard. Pulmonary:     Effort: Pulmonary effort is normal. No respiratory distress.     Breath sounds: Normal breath sounds. No wheezing or rales.  Abdominal:     General: Bowel sounds are normal. There is no distension.     Palpations: Abdomen is soft. There is no mass.     Tenderness: There is no abdominal tenderness.  Musculoskeletal:  General: No tenderness. Normal range of motion.     Cervical back: Normal range of motion and neck supple.     Right lower leg: Edema present.     Left lower leg: Edema present.     Comments: Upper / Lower Extremities: - Normal muscle tone, strength bilateral upper extremities 5/5, lower extremities 5/5  Lymphadenopathy:     Cervical: No cervical adenopathy.  Skin:    General: Skin is warm and  dry.     Findings: No erythema or rash.  Neurological:     Mental Status: He is alert and oriented to person, place, and time.     Comments: Distal sensation intact to light touch all extremities  Psychiatric:        Mood and Affect: Mood normal.        Behavior: Behavior normal.        Thought Content: Thought content normal.     Comments: Well groomed, good eye contact, normal speech and thoughts     Diabetic Foot Exam - Simple   Simple Foot Form Diabetic Foot exam was performed with the following findings: Yes 05/05/2022  8:53 AM  Visual Inspection No deformities, no ulcerations, no other skin breakdown bilaterally: Yes Sensation Testing Intact to touch and monofilament testing bilaterally: Yes Pulse Check Posterior Tibialis and Dorsalis pulse intact bilaterally: Yes Comments Callus formation, no ulceration. Intact monofilament.      Results for orders placed or performed in visit on 04/27/22  TSH  Result Value Ref Range   TSH 0.44 0.40 - 4.50 mIU/L  Uric acid  Result Value Ref Range   Uric Acid, Serum 7.8 4.0 - 8.0 mg/dL  PSA  Result Value Ref Range   PSA 1.31 < OR = 4.00 ng/mL  Hemoglobin A1c  Result Value Ref Range   Hgb A1c MFr Bld 7.3 (H) <5.7 % of total Hgb   Mean Plasma Glucose 163 mg/dL   eAG (mmol/L) 9.0 mmol/L  Lipid panel  Result Value Ref Range   Cholesterol 150 <200 mg/dL   HDL 31 (L) > OR = 40 mg/dL   Triglycerides 952 (H) <150 mg/dL   LDL Cholesterol (Calc)  mg/dL (calc)   Total CHOL/HDL Ratio 4.8 <5.0 (calc)   Non-HDL Cholesterol (Calc) 119 <130 mg/dL (calc)  CBC with Differential/Platelet  Result Value Ref Range   WBC 8.0 3.8 - 10.8 Thousand/uL   RBC 3.14 (L) 4.20 - 5.80 Million/uL   Hemoglobin 10.5 (L) 13.2 - 17.1 g/dL   HCT 84.1 (L) 32.4 - 40.1 %   MCV 98.1 80.0 - 100.0 fL   MCH 33.4 (H) 27.0 - 33.0 pg   MCHC 34.1 32.0 - 36.0 g/dL   RDW 02.7 25.3 - 66.4 %   Platelets 222 140 - 400 Thousand/uL   MPV 11.9 7.5 - 12.5 fL   Neutro Abs  3,888 1,500 - 7,800 cells/uL   Lymphs Abs 2,976 850 - 3,900 cells/uL   Absolute Monocytes 736 200 - 950 cells/uL   Eosinophils Absolute 360 15 - 500 cells/uL   Basophils Absolute 40 0 - 200 cells/uL   Neutrophils Relative % 48.6 %   Total Lymphocyte 37.2 %   Monocytes Relative 9.2 %   Eosinophils Relative 4.5 %   Basophils Relative 0.5 %  COMPLETE METABOLIC PANEL WITH GFR  Result Value Ref Range   Glucose, Bld 115 (H) 65 - 99 mg/dL   BUN 38 (H) 7 - 25 mg/dL   Creat 4.03 (H) 4.74 -  1.28 mg/dL   eGFR 30 (L) > OR = 60 mL/min/1.73m2   BUN/Creatinine Ratio 17 6 - 22 (calc)   Sodium 142 135 - 146 mmol/L   Potassium 4.1 3.5 - 5.3 mmol/L   Chloride 102 98 - 110 mmol/L   CO2 30 20 - 32 mmol/L   Calcium 9.2 8.6 - 10.3 mg/dL   Total Protein 6.4 6.1 - 8.1 g/dL   Albumin 3.9 3.6 - 5.1 g/dL   Globulin 2.5 1.9 - 3.7 g/dL (calc)   AG Ratio 1.6 1.0 - 2.5 (calc)   Total Bilirubin 0.3 0.2 - 1.2 mg/dL   Alkaline phosphatase (APISO) 85 35 - 144 U/L   AST 14 10 - 35 U/L   ALT 14 9 - 46 U/L      Assessment & Plan:   Problem List Items Addressed This Visit     Benign hypertension with CKD (chronic kidney disease) stage III (HCC)    Well-controlled HTN Complication with 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 Continue current BP HALF tabs of Carvedilol 25mg  = 12.5mg  BID, Telmisartan-HCTZ 80-25mg  (HALF tab) daily - Torsemide 40mg  daily Consider swap Torsemide 40 to Furosemide prior to upcoming Nephrology apt last few weeks prior to apt to discuss  2. Encourage improved lifestyle - low sodium diet, regular exercise 3. Continue monitor BP outside office, bring readings to next visit, if persistently >140/90 or new symptoms notify office sooner      Relevant Medications   carvedilol (COREG) 25 MG tablet   Benign prostatic hyperplasia without lower urinary tract symptoms    Controlled BPH On Tamsulosin      CAD, ARTERY BYPASS GRAFT   Relevant Medications    carvedilol (COREG) 25 MG tablet   Gout, chronic   Hyperlipidemia associated with type 2 diabetes mellitus (HCC)    Controlled cholesterol on statin  lifestyle CAD, PAD  Plan: 1. Continue current meds - Atorvastatin 20mg  daily 2. On ASA 325 3 Encourage improved lifestyle - low carb/cholesterol, reduce portion size, continue improving regular exercise      Relevant Medications   carvedilol (COREG) 25 MG tablet   GVOKE HYPOPEN 2-PACK 1 MG/0.2ML SOAJ   Morbid obesity (HCC)   Relevant Medications   GVOKE HYPOPEN 2-PACK 1 MG/0.2ML SOAJ   Type 2 diabetes mellitus with diabetic nephropathy (HCC)    Well-controlled DM with A1c 7.3 improved control on insulin regimen per Endocrinology Complications - CKD III, Hyperlipidemia, GERD, obesity, CAD -  increases risk of future cardiovascular complications   Complication HYPOGLYCEMIA with decline in renal function  Off Metformin due to CKD  Plan:  1. Again advise to DISCONTINUE Glyburide due to hypoglycemia events. - Continue current therapy -  Tresiba, Lispro adjusting doses per Endocrinology  2. Encourage improved lifestyle - low carb, low sugar diet, reduce portion size, continue improving regular exercise 3. Check CBG , bring log to next visit for review 4. Continue ASA,  ARB, Statin  Use GVOKE emergency pen if glucose 54 or less or symptoms hypoglycemia. Instead of oral glucose intake.      Relevant Medications   GVOKE HYPOPEN 2-PACK 1 MG/0.2ML SOAJ   Other Visit Diagnoses     Annual physical exam    -  Primary      Updated Health Maintenance information Reviewed recent lab results with patient Encouraged improvement to lifestyle with diet and exercise Goal of weight loss  Arthritis Keep on Tylenol regimen.  No orders  of the defined types were placed in this encounter.     Meds ordered this encounter  Medications   GVOKE HYPOPEN 2-PACK 1 MG/0.2ML SOAJ    Sig: Inject 1 mg into the skin as needed (hypoglycemia).     Dispense:  1 mL    Refill:  3      Follow up plan: Return in about 6 months (around 11/05/2022) for 6 month DM A1c update, nephrology / cards.  Saralyn Pilar, DO [facility] 05/05/2022, 8:50 AM

## 2022-05-06 NOTE — Discharge Instructions (Signed)

## 2022-05-10 ENCOUNTER — Ambulatory Visit: Payer: Federal, State, Local not specified - PPO | Admitting: Anesthesiology

## 2022-05-10 ENCOUNTER — Encounter: Payer: Self-pay | Admitting: Ophthalmology

## 2022-05-10 ENCOUNTER — Other Ambulatory Visit: Payer: Self-pay

## 2022-05-10 ENCOUNTER — Encounter
Admission: RE | Disposition: A | Discharge status: Home / Self Care | Payer: Self-pay | Source: Home / Self Care | Attending: Ophthalmology

## 2022-05-10 ENCOUNTER — Ambulatory Visit
Admission: RE | Admit: 2022-05-10 | Discharge: 2022-05-10 | Disposition: A | Discharge status: Home / Self Care | Payer: Federal, State, Local not specified - PPO | Attending: Ophthalmology | Admitting: Ophthalmology

## 2022-05-10 DIAGNOSIS — E1136 Type 2 diabetes mellitus with diabetic cataract: Secondary | ICD-10-CM | POA: Insufficient documentation

## 2022-05-10 DIAGNOSIS — H2511 Age-related nuclear cataract, right eye: Secondary | ICD-10-CM | POA: Insufficient documentation

## 2022-05-10 DIAGNOSIS — E669 Obesity, unspecified: Secondary | ICD-10-CM | POA: Insufficient documentation

## 2022-05-10 DIAGNOSIS — I2510 Atherosclerotic heart disease of native coronary artery without angina pectoris: Secondary | ICD-10-CM | POA: Insufficient documentation

## 2022-05-10 DIAGNOSIS — E785 Hyperlipidemia, unspecified: Secondary | ICD-10-CM | POA: Insufficient documentation

## 2022-05-10 DIAGNOSIS — Z7984 Long term (current) use of oral hypoglycemic drugs: Secondary | ICD-10-CM | POA: Diagnosis not present

## 2022-05-10 DIAGNOSIS — I129 Hypertensive chronic kidney disease with stage 1 through stage 4 chronic kidney disease, or unspecified chronic kidney disease: Secondary | ICD-10-CM | POA: Diagnosis not present

## 2022-05-10 DIAGNOSIS — N183 Chronic kidney disease, stage 3 unspecified: Secondary | ICD-10-CM | POA: Diagnosis not present

## 2022-05-10 DIAGNOSIS — E1122 Type 2 diabetes mellitus with diabetic chronic kidney disease: Secondary | ICD-10-CM | POA: Diagnosis not present

## 2022-05-10 DIAGNOSIS — Z794 Long term (current) use of insulin: Secondary | ICD-10-CM | POA: Diagnosis not present

## 2022-05-10 DIAGNOSIS — Z6841 Body Mass Index (BMI) 40.0 and over, adult: Secondary | ICD-10-CM | POA: Insufficient documentation

## 2022-05-10 HISTORY — DX: Gout, unspecified: M10.9

## 2022-05-10 HISTORY — PX: CATARACT EXTRACTION W/PHACO: SHX586

## 2022-05-10 LAB — GLUCOSE, CAPILLARY: Glucose-Capillary: 100 mg/dL — ABNORMAL HIGH (ref 70–99)

## 2022-05-10 SURGERY — PHACOEMULSIFICATION, CATARACT, WITH IOL INSERTION
Anesthesia: Monitor Anesthesia Care | Site: Eye | Laterality: Right

## 2022-05-10 MED ORDER — BRIMONIDINE TARTRATE-TIMOLOL 0.2-0.5 % OP SOLN
OPHTHALMIC | Status: DC | PRN
  Administered 2022-05-10: 1 [drp] via OPHTHALMIC

## 2022-05-10 MED ORDER — MOXIFLOXACIN HCL 0.5 % OP SOLN
OPHTHALMIC | Status: DC | PRN
  Administered 2022-05-10: 1 [drp] via OPHTHALMIC

## 2022-05-10 MED ORDER — FENTANYL CITRATE (PF) 100 MCG/2ML IJ SOLN
INTRAMUSCULAR | Status: DC | PRN
  Administered 2022-05-10: 50 ug via INTRAVENOUS

## 2022-05-10 MED ORDER — SIGHTPATH DOSE#1 BSS IO SOLN
INTRAOCULAR | Status: DC | PRN
  Administered 2022-05-10: 30 mL

## 2022-05-10 MED ORDER — SIGHTPATH DOSE#1 BSS IO SOLN
INTRAOCULAR | Status: DC | PRN
  Administered 2022-05-10: 1 mL via INTRAMUSCULAR

## 2022-05-10 MED ORDER — SIGHTPATH DOSE#1 NA CHONDROIT SULF-NA HYALURON 40-17 MG/ML IO SOLN
INTRAOCULAR | Status: DC | PRN
  Administered 2022-05-10: 1 mL via INTRAOCULAR

## 2022-05-10 MED ORDER — MIDAZOLAM HCL 2 MG/2ML IJ SOLN
INTRAMUSCULAR | Status: DC | PRN
  Administered 2022-05-10: 1 mg via INTRAVENOUS

## 2022-05-10 MED ORDER — LACTATED RINGERS IV SOLN: INTRAVENOUS | Status: DC

## 2022-05-10 MED ORDER — ARMC OPHTHALMIC DILATING DROPS
1.0000 | OPHTHALMIC | Status: DC | PRN
  Administered 2022-05-10 (×3): 1 via OPHTHALMIC

## 2022-05-10 MED ORDER — TETRACAINE HCL 0.5 % OP SOLN
1.0000 [drp] | OPHTHALMIC | Status: DC | PRN
  Administered 2022-05-10 (×3): 1 [drp] via OPHTHALMIC

## 2022-05-10 MED ORDER — SIGHTPATH DOSE#1 BSS IO SOLN
INTRAOCULAR | Status: DC | PRN
  Administered 2022-05-10: 56 mL via OPHTHALMIC

## 2022-05-10 SURGICAL SUPPLY — 17 items
ANGLE REVERSE CUT SHRT 25GA (CUTTER) ×1
CANNULA ANT/CHMB 27G (MISCELLANEOUS) IMPLANT
CANNULA ANT/CHMB 27GA (MISCELLANEOUS) IMPLANT
CATARACT SUITE SIGHTPATH (MISCELLANEOUS) ×1 IMPLANT
CYSTOTOME ANGL RVRS SHRT 25G (CUTTER) IMPLANT
CYSTOTOME ANGL RVRS SHRT 25GA (CUTTER) ×1 IMPLANT
FEE CATARACT SUITE SIGHTPATH (MISCELLANEOUS) ×2 IMPLANT
GLOVE BIOGEL PI IND STRL 8 (GLOVE) ×2 IMPLANT
GLOVE SURG ENC TEXT LTX SZ8 (GLOVE) ×2 IMPLANT
LENS IOL TECNIS EYHANCE 21.0 (Intraocular Lens) IMPLANT
NDL FILTER BLUNT 18X1 1/2 (NEEDLE) ×2 IMPLANT
NEEDLE FILTER BLUNT 18X1 1/2 (NEEDLE) ×1 IMPLANT
PACK VIT ANT 23G (MISCELLANEOUS) IMPLANT
RING MALYGIN (MISCELLANEOUS) IMPLANT
SUT ETHILON 10-0 CS-B-6CS-B-6 (SUTURE)
SUTURE EHLN 10-0 CS-B-6CS-B-6 (SUTURE) IMPLANT
SYR 3ML LL SCALE MARK (SYRINGE) ×2 IMPLANT

## 2022-05-10 NOTE — Op Note (Signed)
PREOPERATIVE DIAGNOSIS:  Nuclear sclerotic cataract of the right eye.   POSTOPERATIVE DIAGNOSIS:  H25.11 Cataract   OPERATIVE PROCEDURE:ORPROCALL@   SURGEON:  Martin Manila, MD.   ANESTHESIA:  Anesthesiologist: Marisue Humble, MD CRNA: Barbette Hair, CRNA  1.      Managed anesthesia care. 2.      0.76ml of Shugarcaine was instilled in the eye following the paracentesis.   COMPLICATIONS:  None.   TECHNIQUE:   Stop and chop   DESCRIPTION OF PROCEDURE:  The patient was examined and consented in the preoperative holding area where the aforementioned topical anesthesia was applied to the right eye and then brought back to the Operating Room where the right eye was prepped and draped in the usual sterile ophthalmic fashion and a lid speculum was placed. A paracentesis was created with the side port blade and the anterior chamber was filled with viscoelastic. A near clear corneal incision was performed with the steel keratome. A continuous curvilinear capsulorrhexis was performed with a cystotome followed by the capsulorrhexis forceps. Hydrodissection and hydrodelineation were carried out with BSS on a blunt cannula. The lens was removed in a stop and chop  technique and the remaining cortical material was removed with the irrigation-aspiration handpiece. The capsular bag was inflated with viscoelastic and the Technis [id number]  lens was placed in the capsular bag without complication. The remaining viscoelastic was removed from the eye with the irrigation-aspiration handpiece. The wounds were hydrated. The anterior chamber was flushed with BSS and the eye was inflated to physiologic pressure. 0.20ml of Vigamox was placed in the anterior chamber. The wounds were found to be water tight. The eye was dressed with Combigan. The patient was given protective glasses to wear throughout the day and a shield with which to sleep tonight. The patient was also given drops with which to begin a drop regimen today and  will follow-up with me in one day. Implant Name Type Inv. Item Serial No. Manufacturer Lot No. LRB No. Used Action  LENS IOL TECNIS EYHANCE 21.0 - [id number] Intraocular Lens LENS IOL TECNIS EYHANCE 21.0 [id number] [employer]  Right 1 Implanted   Procedure(s) with comments: CATARACT EXTRACTION PHACO AND INTRAOCULAR LENS PLACEMENT (IOC) RIGHT DIABETIC (Right) - Diabetic  7.39  00:45.1  Electronically signed: Galen Pratt 05/10/2022 7:43 AM

## 2022-05-10 NOTE — H&P (Signed)
[facility]   Primary Care Physician:  [name], DO Ophthalmologist: Dr. Druscilla Brownie  Pre-Procedure History & Physical: HPI:  THEORY [name] is a 77 y.o. male here for cataract surgery.   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   Vertigo    due to H/O right acoustic neuroma    Past Surgical History:  Procedure Laterality Date   BACK SURGERY     BASAL CELL CARCINOMA EXCISION  2013   removed from right side of face   CARPAL TUNNEL RELEASE     CATARACT EXTRACTION W/PHACO Left 04/26/2022   Procedure: CATARACT EXTRACTION PHACO AND INTRAOCULAR LENS PLACEMENT (IOC) LEFT DIABETIC  13.21  01:17.4;  Surgeon: Martin Manila, MD;  Location: [facility];  Service: Ophthalmology;  Laterality: Left;  Diabetic   CORONARY ANGIOPLASTY WITH STENT PLACEMENT     CORONARY ARTERY BYPASS GRAFT  NOV 2007   x5   cyst removal of right hand     ruptured disk surgery     neck     Prior to Admission medications   Medication Sig Start Date End Date Taking? Authorizing Provider  acetaminophen (TYLENOL) 500 MG tablet Take 500 mg by mouth every 6 (six) hours as needed.   Yes [provider]  allopurinol (ZYLOPRIM) 100 MG tablet TAKE 1 TABLET DAILY 09/03/21  Yes Karamalegos, [name], DO  aspirin 325 MG tablet Take 325 mg by mouth daily.   Yes [provider]  atorvastatin (LIPITOR) 20 MG tablet Take 1 tablet (20 mg total) by  mouth at bedtime. 01/26/22  Yes Karamalegos, [name], DO  carvedilol (COREG) 25 MG tablet Take 25 mg by mouth as directed. Take half tablet twice daily   Yes [provider]  Cholecalciferol 125 MCG (5000 UT) TABS Take 5,000 Units by mouth daily.   Yes [provider]  cyanocobalamin (VITAMIN B12) 1000 MCG tablet Take 3,000 mcg by mouth daily.   Yes [provider]  empagliflozin (JARDIANCE) 10 MG TABS tablet Take 10 mg by mouth daily.   Yes [provider]  esomeprazole (NEXIUM) 20 MG capsule Take 1 capsule (20 mg total) by mouth daily before breakfast. 01/19/21  Yes Karamalegos, [name], DO  hydrocortisone valerate cream (WESTCORT) 0.2 % Apply 1 application topically 2 (two) times daily. 07/13/20  Yes Karamalegos, [name], DO  insulin degludec (TRESIBA) 200 UNIT/ML FlexTouch Pen Inject 70 Units into the skin daily. 02/15/21  Yes [provider]  insulin lispro (HUMALOG) 100 UNIT/ML KwikPen 21 Units 3 (three) times daily. + sliding scale 11/26/18  Yes [provider]  Iodoquinol-Hydrocortisone-Aloe 1-1.9 % CREA Apply 1 Application topically every other day.   Yes [provider]  ketoconazole (NIZORAL) 2 % cream Apply to the face qhs M/W/F 06/02/21  Yes [name],  Martin Kid, MD  Magnesium 250 MG TABS Take 1 tablet by mouth daily.   Yes [provider]  Multiple Vitamin (MULTI-VITAMIN) tablet Take 1 tablet by mouth daily.   Yes [provider]  tamsulosin (FLOMAX) 0.4 MG CAPS capsule Take 1 capsule (0.4 mg total) by mouth daily. 04/06/22  Yes Karamalegos, [name], DO  telmisartan-hydrochlorothiazide (MICARDIS HCT) 80-25 MG tablet Take 0.5 tablets by mouth daily. 04/10/19  Yes [provider]  torsemide (DEMADEX) 20 MG tablet Take 40 mg by mouth daily. 10/18/21 10/18/22 Yes [provider]  Continuous Blood Gluc Receiver (FREESTYLE LIBRE 2 READER) DEVI Use to monitor blood glucose. 05/10/21   [provider]  Continuous Blood Gluc Sensor (FREESTYLE LIBRE 2 SENSOR) MISC  12/22/21   [provider]  GVOKE HYPOPEN 2-PACK 1 MG/0.2ML SOAJ Inject 1 mg into the skin as needed (hypoglycemia). 05/05/22   Karamalegos, [name], DO  Insulin Pen Needle (BD PEN NEEDLE NANO 2ND GEN) 32G X 4 MM MISC 4 shots per day 11/26/18   [provider]  Lancets (ACCU-CHEK SOFT TOUCH) lancets Use as instructed 10/14/11   Martin Shams, MD    Allergies as of 04/13/2022 - Review Complete 02/17/2022  Allergen Reaction Noted   Codeine  07/14/2009   Pneumococcal vaccines Hives 07/03/2014    Family History  Problem Relation Age of Onset   Heart disease Mother    Hyperlipidemia Mother    Hypertension Mother    Heart failure Mother    Diabetes Father    Heart attack Father    Colon cancer Sister 43   Diabetes Sister    Diabetes Brother    Coronary artery disease Brother    Crohn's disease Brother    Melanoma Brother    Diabetes Brother    Coronary artery disease Other    Diabetes Other    Cancer Other     Social History   Socioeconomic History   Marital status: Married    Spouse name: Not on file   Number of children: Not on file   Years of education: Not on file   Highest education level: Not on file  Occupational History   Not on file  Tobacco Use   Smoking status: Never   Smokeless tobacco: Never   Tobacco comments:    tobacco use- no   Vaping Use   Vaping Use: Never used  Substance and Sexual Activity   Alcohol use: Never   Drug use: No   Sexual activity: Not on file  Other Topics Concern   Not on file  Social History Narrative   Does not regularly exercise. Works part time at [employer] 2-3 hours at night.    Social Determinants of Health   Financial Resource Strain: Not on file  Food Insecurity: Not on file  Transportation Needs: Not on file  Physical Activity: Not on file  Stress: Not on file  Social Connections: Not on file  Intimate Partner  Violence: Not on file    Review of Systems: See HPI, otherwise negative ROS  Physical Exam: BP (!) 116/53   Pulse 74   Temp 98.1 F (36.7 C) (Temporal)   Resp 15   Ht 5\' 5"  (1.651 m)   Wt 112.9 kg   SpO2 97%   BMI 41.44 kg/m  General:   Alert, cooperative in NAD Head:  Normocephalic and atraumatic. Respiratory:  Normal work of breathing. Cardiovascular:  RRR  Impression/Plan: [name] is here for cataract  surgery.  Risks, benefits, limitations, and alternatives regarding cataract surgery have been reviewed with the patient.  Questions have been answered.  All parties agreeable.   Martin Manila, MD  05/10/2022, 7:10 AM

## 2022-05-10 NOTE — Transfer of Care (Signed)
Immediate Anesthesia Transfer of Care Note  Patient: [name]  Procedure(s) Performed: CATARACT EXTRACTION PHACO AND INTRAOCULAR LENS PLACEMENT (IOC) RIGHT DIABETIC (Right: Eye)  Patient Location: PACU  Anesthesia Type: MAC  Level of Consciousness: awake, alert  and patient cooperative  Airway and Oxygen Therapy: Patient Spontanous Breathing and Patient connected to supplemental oxygen  Post-op Assessment: Post-op Vital signs reviewed, Patient's Cardiovascular Status Stable, Respiratory Function Stable, Patent Airway and No signs of Nausea or vomiting  Post-op Vital Signs: Reviewed and stable  Complications: No notable events documented.

## 2022-05-10 NOTE — Anesthesia Postprocedure Evaluation (Signed)
Anesthesia Post Note  Patient: [name]  Procedure(s) Performed: CATARACT EXTRACTION PHACO AND INTRAOCULAR LENS PLACEMENT (IOC) RIGHT DIABETIC (Right: Eye)  Patient location during evaluation: PACU Anesthesia Type: MAC Level of consciousness: awake and alert Pain management: pain level controlled Vital Signs Assessment: post-procedure vital signs reviewed and stable Respiratory status: spontaneous breathing, nonlabored ventilation, respiratory function stable and patient connected to nasal cannula oxygen Cardiovascular status: stable and blood pressure returned to baseline Postop Assessment: no apparent nausea or vomiting Anesthetic complications: no   No notable events documented.   Last Vitals:  Vitals:   05/10/22 0745 05/10/22 0749  BP: (!) 101/54 114/64  Pulse: 65 65  Resp: 13 12  Temp:  36.5 C  SpO2: 95% 95%    Last Pain:  Vitals:   05/10/22 0749  TempSrc:   PainSc: 0-No pain                 Sandralee Tarkington C Lourie Retz

## 2022-05-11 ENCOUNTER — Encounter: Payer: Self-pay | Admitting: Ophthalmology

## 2022-05-19 ENCOUNTER — Ambulatory Visit: Payer: Federal, State, Local not specified - PPO | Admitting: Podiatry

## 2022-05-26 ENCOUNTER — Encounter: Payer: Self-pay | Admitting: Podiatry

## 2022-05-26 ENCOUNTER — Ambulatory Visit (INDEPENDENT_AMBULATORY_CARE_PROVIDER_SITE_OTHER): Payer: Federal, State, Local not specified - PPO | Admitting: Podiatry

## 2022-05-26 VITALS — BP 134/62 | HR 84

## 2022-05-26 DIAGNOSIS — E119 Type 2 diabetes mellitus without complications: Secondary | ICD-10-CM

## 2022-05-26 DIAGNOSIS — B351 Tinea unguium: Secondary | ICD-10-CM

## 2022-05-26 DIAGNOSIS — M79675 Pain in left toe(s): Secondary | ICD-10-CM | POA: Diagnosis not present

## 2022-05-26 DIAGNOSIS — M79674 Pain in right toe(s): Secondary | ICD-10-CM | POA: Diagnosis not present

## 2022-05-26 NOTE — Progress Notes (Signed)
This patient returns to my office for at risk foot care.  This patient requires this care by a professional since this patient will be at risk due to having  diabetes.  This patient is unable to cut nails himself since the patient cannot reach his nails.These nails are painful walking and wearing shoes.  This patient presents for at risk foot care today.  General Appearance  Alert, conversant and in no acute stress.  Vascular  Dorsalis pedis and posterior tibial  pulses are palpable  bilaterally.  Capillary return is within normal limits  bilaterally. Temperature is within normal limits  bilaterally.  Neurologic  Senn-Weinstein monofilament wire test within normal limits  bilaterally. Muscle power within normal limits bilaterally.  Nails Thick disfigured discolored nails with subungual debris  hallux nails  bilaterally. No evidence of bacterial infection or drainage bilaterally.  Orthopedic  No limitations of motion  feet .  No crepitus or effusions noted.  No bony pathology or digital deformities noted.  Skin  normotropic skin with no porokeratosis noted bilaterally.  No signs of infections or ulcers noted.     Onychomycosis  Pain in right toes  Pain in left toes  Consent was obtained for treatment procedures.   Mechanical debridement of nails 1-5  bilaterally performed with a nail nipper.  Filed with dremel without incident.    Return office visit     3 months                Told patient to return for periodic foot care and evaluation due to potential at risk complications.   Nicholes Rough D.P.M.

## 2022-06-03 LAB — HM DIABETES EYE EXAM

## 2022-06-08 ENCOUNTER — Encounter: Payer: Self-pay | Admitting: Family Medicine

## 2022-06-15 ENCOUNTER — Ambulatory Visit: Payer: Federal, State, Local not specified - PPO | Admitting: Dermatology

## 2022-06-15 VITALS — BP 104/65

## 2022-06-15 DIAGNOSIS — L918 Other hypertrophic disorders of the skin: Secondary | ICD-10-CM

## 2022-06-15 DIAGNOSIS — L821 Other seborrheic keratosis: Secondary | ICD-10-CM

## 2022-06-15 DIAGNOSIS — L304 Erythema intertrigo: Secondary | ICD-10-CM

## 2022-06-15 DIAGNOSIS — X32XXXA Exposure to sunlight, initial encounter: Secondary | ICD-10-CM

## 2022-06-15 DIAGNOSIS — Z79899 Other long term (current) drug therapy: Secondary | ICD-10-CM

## 2022-06-15 DIAGNOSIS — Z1283 Encounter for screening for malignant neoplasm of skin: Secondary | ICD-10-CM | POA: Diagnosis not present

## 2022-06-15 DIAGNOSIS — W908XXA Exposure to other nonionizing radiation, initial encounter: Secondary | ICD-10-CM

## 2022-06-15 DIAGNOSIS — L814 Other melanin hyperpigmentation: Secondary | ICD-10-CM

## 2022-06-15 DIAGNOSIS — L820 Inflamed seborrheic keratosis: Secondary | ICD-10-CM

## 2022-06-15 DIAGNOSIS — D229 Melanocytic nevi, unspecified: Secondary | ICD-10-CM

## 2022-06-15 DIAGNOSIS — Z85828 Personal history of other malignant neoplasm of skin: Secondary | ICD-10-CM

## 2022-06-15 DIAGNOSIS — D1801 Hemangioma of skin and subcutaneous tissue: Secondary | ICD-10-CM

## 2022-06-15 DIAGNOSIS — L578 Other skin changes due to chronic exposure to nonionizing radiation: Secondary | ICD-10-CM

## 2022-06-15 DIAGNOSIS — D692 Other nonthrombocytopenic purpura: Secondary | ICD-10-CM

## 2022-06-15 NOTE — Progress Notes (Signed)
Follow-Up Visit   Subjective  [name] is a 77 y.o. male who presents for the following: Skin Cancer Screening and Full Body Skin Exam - History of BCC The patient presents for Total-Body Skin Exam (TBSE) for skin cancer screening and mole check. The patient has spots, moles and lesions to be evaluated, some may be new or changing and the patient has concerns that these could be cancer.  The following portions of the chart were reviewed this encounter and updated as appropriate: medications, allergies, medical history  Review of Systems:  No other skin or systemic complaints except as noted in HPI or Assessment and Plan.  Objective  Well appearing patient in no apparent distress; mood and affect are within normal limits.  A full examination was performed including scalp, head, eyes, ears, nose, lips, neck, chest, axillae, abdomen, back, buttocks, bilateral upper extremities, bilateral lower extremities, hands, feet, fingers, toes, fingernails, and toenails. All findings within normal limits unless otherwise noted below.   Relevant physical exam findings are noted in the Assessment and Plan.  Left scalp x 1, right sideburn x 1 Erythematous stuck-on, waxy papule or plaque   Assessment & Plan   HISTORY OF BASAL CELL CARCINOMA OF THE SKIN - No evidence of recurrence today - Recommend regular full body skin exams - Recommend daily broad spectrum sunscreen SPF 30+ to sun-exposed areas, reapply every 2 hours as needed.  - Call if any new or changing lesions are noted between office visit  INTERTRIGO - groin area Well controlled, today, but periodic flares Intertrigo is a chronic recurrent rash that occurs in skin fold areas that may be associated with friction; heat; moisture; yeast; fungus; and bacteria.  It is exacerbated by increased movement / activity; sweating; and higher atmospheric temperature. Treatment Plan Cont skin medicinals intertrigo cream Iodoquinol:  1% Hydrocortisone: 2.5% Niacinamide: 2% Vehicle: Cream  Acrochordons (Skin Tags) - Fleshy, skin-colored pedunculated papules - Benign appearing.  - Observe. - If desired, they can be removed with an in office procedure that is not covered by insurance. - Please call the clinic if you notice any new or changing lesions.   Purpura - Chronic; persistent and recurrent.  Treatable, but not curable. - Violaceous macules and patches - Benign - Related to trauma, age, sun damage and/or use of blood thinners, chronic use of topical and/or oral steroids - Observe - Can use OTC arnica containing moisturizer such as Dermend Bruise Formula if desired - Call for worsening or other concerns  LENTIGINES, SEBORRHEIC KERATOSES, HEMANGIOMAS - Benign normal skin lesions - Benign-appearing - Call for any changes  MELANOCYTIC NEVI - Tan-brown and/or pink-flesh-colored symmetric macules and papules - Benign appearing on exam today - Observation - Call clinic for new or changing moles - Recommend daily use of broad spectrum spf 30+ sunscreen to sun-exposed areas.   ACTINIC DAMAGE - Chronic condition, secondary to cumulative UV/sun exposure - diffuse scaly erythematous macules with underlying dyspigmentation - Recommend daily broad spectrum sunscreen SPF 30+ to sun-exposed areas, reapply every 2 hours as needed.  - Staying in the shade or wearing long sleeves, sun glasses (UVA+UVB protection) and wide brim hats (4-inch brim around the entire circumference of the hat) are also recommended for sun protection.  - Call for new or changing lesions.  SKIN CANCER SCREENING PERFORMED TODAY.  Inflamed seborrheic keratosis Left scalp x 1, right sideburn x 1  Symptomatic, irritating, patient would like treated.  Benign-appearing.  Call clinic for new or changing lesions.  Prior to procedure, discussed risks of blister formation, small wound, skin dyspigmentation, or rare scar following treatment.  Recommend Vaseline ointment to treated areas while healing.   Destruction of lesion - Left scalp x 1, right sideburn x 1 Complexity: simple   Destruction method: cryotherapy   Informed consent: discussed and consent obtained   Timeout:  patient name, date of birth, surgical site, and procedure verified Lesion destroyed using liquid nitrogen: Yes   Region frozen until ice ball extended beyond lesion: Yes   Outcome: patient tolerated procedure well with no complications   Post-procedure details: wound care instructions given     Return in about 1 year (around 06/15/2023) for TBSE.  I, Joanie Coddington, CMA, am acting as scribe for Armida Sans, MD .   Documentation: I have reviewed the above documentation for accuracy and completeness, and I agree with the above.  Armida Sans, MD

## 2022-06-15 NOTE — Patient Instructions (Signed)
Cryotherapy Aftercare  Wash gently with soap and water everyday.   Apply Vaseline and Band-Aid daily until healed.     Due to recent changes in healthcare laws, you may see results of your pathology and/or laboratory studies on MyChart before the doctors have had a chance to review them. We understand that in some cases there may be results that are confusing or concerning to you. Please understand that not all results are received at the same time and often the doctors may need to interpret multiple results in order to provide you with the best plan of care or course of treatment. Therefore, we ask that you please give us 2 business days to thoroughly review all your results before contacting the office for clarification. Should we see a critical lab result, you will be contacted sooner.   If You Need Anything After Your Visit  If you have any questions or concerns for your doctor, please call our main line at [phone] and press option 4 to reach your doctor's medical assistant. If no one answers, please leave a voicemail as directed and we will return your call as soon as possible. Messages left after 4 pm will be answered the following business day.   You may also send us a message via MyChart. We typically respond to MyChart messages within 1-2 business days.  For prescription refills, please ask your pharmacy to contact our office. Our fax number is [phone].  If you have an urgent issue when the clinic is closed that cannot wait until the next business day, you can page your doctor at the number below.    Please note that while we do our best to be available for urgent issues outside of office hours, we are not available 24/7.   If you have an urgent issue and are unable to reach us, you may choose to seek medical care at your doctor's office, retail clinic, urgent care center, or emergency room.  If you have a medical emergency, please immediately call 911 or go to the  emergency department.  Pager Numbers  - Dr. Kowalski: [phone]  - Dr. Moye: [phone]  - Dr. Stewart: [phone]  In the event of inclement weather, please call our main line at [phone] for an update on the status of any delays or closures.  Dermatology Medication Tips: Please keep the boxes that topical medications come in in order to help keep track of the instructions about where and how to use these. Pharmacies typically print the medication instructions only on the boxes and not directly on the medication tubes.   If your medication is too expensive, please contact our office at [phone] option 4 or send us a message through MyChart.   We are unable to tell what your co-pay for medications will be in advance as this is different depending on your insurance coverage. However, we may be able to find a substitute medication at lower cost or fill out paperwork to get insurance to cover a needed medication.   If a prior authorization is required to get your medication covered by your insurance company, please allow us 1-2 business days to complete this process.  Drug prices often vary depending on where the prescription is filled and some pharmacies may offer cheaper prices.  The website www.goodrx.com contains coupons for medications through different pharmacies. The prices here do not account for what the cost may be with help from insurance (it may be cheaper with your insurance), but the website can   give you the price if you did not use any insurance.  - You can print the associated coupon and take it with your prescription to the pharmacy.  - You may also stop by our office during regular business hours and pick up a GoodRx coupon card.  - If you need your prescription sent electronically to a different pharmacy, notify our office through [facility] MyChart or by phone at [phone] option 4.     Si Usted Necesita Algo Despus de Su Visita  Tambin puede  enviarnos un mensaje a travs de MyChart. Por lo general respondemos a los mensajes de MyChart en el transcurso de 1 a 2 das hbiles.  Para renovar recetas, por favor pida a su farmacia que se ponga en contacto con nuestra oficina. Nuestro nmero de fax es el [phone].  Si tiene un asunto urgente cuando la clnica est cerrada y que no puede esperar hasta el siguiente da hbil, puede llamar/localizar a su doctor(a) al nmero que aparece a continuacin.   Por favor, tenga en cuenta que aunque hacemos todo lo posible para estar disponibles para asuntos urgentes fuera del horario de oficina, no estamos disponibles las 24 horas del da, los 7 das de la semana.   Si tiene un problema urgente y no puede comunicarse con nosotros, puede optar por buscar atencin mdica  en el consultorio de su doctor(a), en una clnica privada, en un centro de atencin urgente o en una sala de emergencias.  Si tiene una emergencia mdica, por favor llame inmediatamente al 911 o vaya a la sala de emergencias.  Nmeros de bper  - Dr. Kowalski: [phone]  - Dra. Moye: [phone]  - Dra. Stewart: [phone]  En caso de inclemencias del tiempo, por favor llame a nuestra lnea principal al [phone] para una actualizacin sobre el estado de cualquier retraso o cierre.  Consejos para la medicacin en dermatologa: Por favor, guarde las cajas en las que vienen los medicamentos de uso tpico para ayudarle a seguir las instrucciones sobre dnde y cmo usarlos. Las farmacias generalmente imprimen las instrucciones del medicamento slo en las cajas y no directamente en los tubos del medicamento.   Si su medicamento es muy caro, por favor, pngase en contacto con nuestra oficina llamando al [phone] y presione la opcin 4 o envenos un mensaje a travs de MyChart.   No podemos decirle cul ser su copago por los medicamentos por adelantado ya que esto es diferente dependiendo de la cobertura de su seguro.  Sin embargo, es posible que podamos encontrar un medicamento sustituto a menor costo o llenar un formulario para que el seguro cubra el medicamento que se considera necesario.   Si se requiere una autorizacin previa para que su compaa de seguros cubra su medicamento, por favor permtanos de 1 a 2 das hbiles para completar este proceso.  Los precios de los medicamentos varan con frecuencia dependiendo del lugar de dnde se surte la receta y alguna farmacias pueden ofrecer precios ms baratos.  El sitio web www.goodrx.com tiene cupones para medicamentos de diferentes farmacias. Los precios aqu no tienen en cuenta lo que podra costar con la ayuda del seguro (puede ser ms barato con su seguro), pero el sitio web puede darle el precio si no utiliz ningn seguro.  - Puede imprimir el cupn correspondiente y llevarlo con su receta a la farmacia.  - Tambin puede pasar por nuestra oficina durante el horario de atencin regular y recoger una tarjeta de cupones de GoodRx.  -   Si necesita que su receta se enve electrnicamente a una farmacia diferente, informe a nuestra oficina a travs de MyChart de [place] o por telfono llamando al [phone] y presione la opcin 4.  

## 2022-06-17 ENCOUNTER — Encounter: Payer: Self-pay | Admitting: Dermatology

## 2022-07-22 ENCOUNTER — Encounter: Payer: Federal, State, Local not specified - PPO | Attending: Physician Assistant | Admitting: Physician Assistant

## 2022-07-22 DIAGNOSIS — E11622 Type 2 diabetes mellitus with other skin ulcer: Secondary | ICD-10-CM | POA: Insufficient documentation

## 2022-07-22 DIAGNOSIS — L98498 Non-pressure chronic ulcer of skin of other sites with other specified severity: Secondary | ICD-10-CM | POA: Insufficient documentation

## 2022-07-22 DIAGNOSIS — I959 Hypotension, unspecified: Secondary | ICD-10-CM | POA: Insufficient documentation

## 2022-07-25 ENCOUNTER — Other Ambulatory Visit: Payer: Self-pay | Admitting: Family Medicine

## 2022-07-25 DIAGNOSIS — M1A0790 Idiopathic chronic gout, unspecified ankle and foot, without tophus (tophi): Secondary | ICD-10-CM

## 2022-07-26 NOTE — Telephone Encounter (Signed)
Requested Prescriptions  Refused Prescriptions Disp Refills   allopurinol (ZYLOPRIM) 100 MG tablet [Pharmacy Med Name: ALLOPURINOL  TAB 100MG ] 90 tablet 3    Sig: TAKE 1 TABLET DAILY     Endocrinology:  Gout Agents - allopurinol Failed - 07/25/2022 12:18 PM      Failed - Cr in normal range and within 360 days    Creat  Date Value Ref Range Status  04/27/2022 2.20 (H) 0.70 - 1.28 mg/dL Final   Creatinine,U  Date Value Ref Range Status  01/17/2012 179.2 mg/dL Final   Creatinine, Urine  Date Value Ref Range Status  10/05/2021 122  Final         Passed - Uric Acid in normal range and within 360 days    Uric Acid, Serum  Date Value Ref Range Status  04/27/2022 7.8 4.0 - 8.0 mg/dL Final    Comment:    Therapeutic target for gout patients: <6.0 mg/dL .          Passed - Valid encounter within last 12 months    Recent Outpatient Visits           2 months ago Annual physical exam   [place] [facility] [place], [name], DO   6 months ago Type 2 diabetes mellitus with diabetic nephropathy, with long-term current use of insulin (HCC)   [facility] [place], [name], DO   9 months ago Type 2 diabetes mellitus with diabetic nephropathy, with long-term current use of insulin (HCC)   [place] [facility] [place], [name], DO   1 year ago Type 2 diabetes mellitus with diabetic nephropathy, with long-term current use of insulin (HCC)   [place] [facility] [place], [name], DO   1 year ago Annual physical exam   [facility] [name], DO       Future Appointments             In 3 months Althea Charon, [name], DO [place] [facility], [place]   In 11 months [name], MD [facility]            Passed - CBC within normal limits and completed in the last 12 months    WBC   Date Value Ref Range Status  04/27/2022 8.0 3.8 - 10.8 Thousand/uL Final   RBC  Date Value Ref Range Status  04/27/2022 3.14 (L) 4.20 - 5.80 Million/uL Final   Hemoglobin  Date Value Ref Range Status  04/27/2022 10.5 (L) 13.2 - 17.1 g/dL Final   HGB  Date Value Ref Range Status  04/01/2013 11.5 (L) 13.0 - 18.0 g/dL Final   HCT  Date Value Ref Range Status  04/27/2022 30.8 (L) 38.5 - 50.0 % Final  04/01/2013 33.0 (L) 40.0 - 52.0 % Final   MCHC  Date Value Ref Range Status  04/27/2022 34.1 32.0 - 36.0 g/dL Final   [facility]  Date Value Ref Range Status  04/27/2022 33.4 (H) 27.0 - 33.0 pg Final   MCV  Date Value Ref Range Status  04/27/2022 98.1 80.0 - 100.0 fL Final  04/01/2013 92 80 - 100 fL Final   No results found for: "PLTCOUNTKUC", "LABPLAT", "POCPLA" RDW  Date Value Ref Range Status  04/27/2022 13.3 11.0 - 15.0 % Final  04/01/2013 12.9 11.5 - 14.5 % Final

## 2022-07-27 ENCOUNTER — Other Ambulatory Visit: Payer: Self-pay

## 2022-07-27 DIAGNOSIS — M1A0790 Idiopathic chronic gout, unspecified ankle and foot, without tophus (tophi): Secondary | ICD-10-CM

## 2022-07-27 MED ORDER — ALLOPURINOL 100 MG PO TABS: 100.0000 mg | ORAL_TABLET | Freq: Every day | ORAL | 3 refills | Status: DC

## 2022-07-28 NOTE — Progress Notes (Addendum)
RIELY, BASKETT ([id number]) [id number] Page 1 of 8 Visit Report for 07/22/2022 Chief Complaint Document Details Patient Name: Date of Service: [place] GO Benedetto Coons [place] W. 07/22/2022 8:30 A M Medical Record Number: [id number] Patient Account Number: [id number] Date of Birth/Sex: Treating RN: August 15, 1945 (77 y.o. Male) Midge Aver Primary Care Provider: Saralyn Pilar Other Clinician: Referring Provider: Treating Provider/Extender: Allen Derry Self, Referral Weeks in Treatment: 0 Information Obtained from: Patient Chief Complaint Left knee traumatic ulcer Electronic Signature(s) Signed: 08/01/2022 8:33:33 AM By: Allen Derry PA-C Entered By: Allen Derry on 08/01/2022 08:33:33 -------------------------------------------------------------------------------- HPI Details Patient Name: Date of Service: [place] Alysia Penna, KEV IN W. 07/22/2022 8:30 A M Medical Record Number: [id number] Patient Account Number: [id number] Date of Birth/Sex: Treating RN: [birth year] (76 y.o. Male) Midge Aver Primary Care Provider: Saralyn Pilar Other Clinician: Referring Provider: Treating Provider/Extender: Allen Derry Self, Referral Weeks in Treatment: 0 History of Present Illness Chronic/Inactive Conditions Condition 1: 07-22-2022 Patient's ABI was 0.85 on the left and 0.77 on the right HPI Description: 07-22-2022 this is a note that is being put in late secondary to the fact that computers were down across the world due to a [employer] outage. Therefore there may be some lack of detail compared to what would normally be present in the notes. With that being said this patient had arrived in the clinic due to a wound on the left knee area. This was secondary to a traumatic abrasion/injury secondary to a fall that occurred around May 22, 2022. This actually appears to be completely healed and was great news as far as that is concerned. With that being said what he was concerned about as  "not healing" was actually just scar tissue that was remaining at that point. Fortunately I do not see anything however that appears to be open currently and I think that he is really doing quite well. He does have a history of diabetes mellitus type 2 and hypotension Electronic Signature(s) Signed: 08/01/2022 8:35:17 AM By: Allen Derry PA-C Entered By: Allen Derry on 08/01/2022 08:35:17 Coralee North ([id number]) [id number] Page 2 of 8 -------------------------------------------------------------------------------- Physical Exam Details Patient Name: Date of Service: Rosalita Chessman IN W. 07/22/2022 8:30 A M Medical Record Number: [id number] Patient Account Number: [id number] Date of Birth/Sex: Treating RN: 1945-12-17 (76 y.o. Male) Midge Aver Primary Care Provider: Saralyn Pilar Other Clinician: Referring Provider: Treating Provider/Extender: Allen Derry Self, Referral Weeks in Treatment: 0 Constitutional sitting or standing blood pressure is within target range for patient.. pulse regular and within target range for patient.Marland Kitchen respirations regular, non-labored and within target range for patient.Marland Kitchen temperature within target range for patient.. Well-nourished and well-hydrated in no acute distress. Eyes conjunctiva clear no eyelid edema noted. pupils equal round and reactive to light and accommodation. Ears, Nose, Mouth, and Throat no gross abnormality of ear auricles or external auditory canals. normal hearing noted during conversation. mucus membranes moist. Respiratory normal breathing without difficulty. Cardiovascular 1+ dorsalis pedis/posterior tibialis pulses. no clubbing, cyanosis, significant edema, <3 sec cap refill. Musculoskeletal normal gait and posture. no significant deformity or arthritic changes, no loss or range of motion, no clubbing. Psychiatric this patient is able to make decisions and demonstrates good insight into disease  process. Alert and Oriented x 3. pleasant and cooperative. Notes Upon inspection patient's wound actually appears to be completely healed which was great news and fortunately I do not see any signs of worsening overall. I am actually very pleased with where things stand at this point. In  general I think that he is making good progress towards healing I do not see anything this can require intervention from wound care perspective at this point. Electronic Signature(s) Signed: 08/01/2022 8:36:06 AM By: Allen Derry PA-C Entered By: Allen Derry on 08/01/2022 08:36:06 -------------------------------------------------------------------------------- Physician Orders Details Patient Name: Date of Service: [address] [place], Carlis Stable IN W. 07/22/2022 8:30 A M Medical Record Number: [id number] Patient Account Number: [id number] Date of Birth/Sex: Treating RN: 1945-04-13 (76 y.o. Male) Midge Aver Primary Care Provider: Saralyn Pilar Other Clinician: Referring Provider: Treating Provider/Extender: Allen Derry Self, Referral Weeks in Treatment: 0 Verbal / Phone Orders: No Coralee North ([id number]) [id number] Page 3 of 8 Diagnosis Coding Discharge From [facility] Services Consult Only - May use A and D ointment at night Follow-up Appointments Other: - Call if needed Electronic Signature(s) Signed: 07/28/2022 4:06:59 PM By: Allen Derry PA-C Signed: 07/28/2022 4:43:06 PM By: Midge Aver MSN RN CNS WTA Entered By: Midge Aver on 07/26/2022 11:28:23 -------------------------------------------------------------------------------- Problem List Details Patient Name: Date of Service: Erik Obey, Carlis Stable IN W. 07/22/2022 8:30 A M Medical Record Number: [id number] Patient Account Number: [id number] Date of Birth/Sex: Treating RN: 22-Nov-1945 (76 y.o. Male) Midge Aver Primary Care Provider: Saralyn Pilar Other Clinician: Referring Provider: Treating Provider/Extender: Allen Derry Self, Referral Weeks in Treatment: 0 Active Problems ICD-10 Encounter Code Description Active Date MDM Diagnosis E11.622 Type 2 diabetes mellitus with other skin ulcer 07/22/2022 No Yes L98.498 Non-pressure chronic ulcer of skin of other sites with other specified severity 07/22/2022 No Yes I95.9 Hypotension, unspecified 07/22/2022 No Yes Inactive Problems Resolved Problems Electronic Signature(s) Signed: 08/01/2022 8:32:53 AM By: Allen Derry PA-C Entered By: Allen Derry on 08/01/2022 08:32:52 Coralee North ([id number]) [id number] Page 4 of 8 -------------------------------------------------------------------------------- Progress Note Details Patient Name: Date of Service: Rosalita Chessman IN W. 07/22/2022 8:30 A M Medical Record Number: [id number] Patient Account Number: [id number] Date of Birth/Sex: Treating RN: Oct 31, 1945 (76 y.o. Male) Midge Aver Primary Care Provider: Saralyn Pilar Other Clinician: Referring Provider: Treating Provider/Extender: Allen Derry Self, Referral Weeks in Treatment: 0 Subjective Chief Complaint Information obtained from Patient Left knee traumatic ulcer History of Present Illness (HPI) Chronic/Inactive Condition: 07-22-2022 Patient's ABI was 0.85 on the left and 0.77 on the right 07-22-2022 this is a note that is being put in late secondary to the fact that computers were down across the world due to a [employer] outage. Therefore there may be some lack of detail compared to what would normally be present in the notes. With that being said this patient had arrived in the clinic due to a wound on the left knee area. This was secondary to a traumatic abrasion/injury secondary to a fall that occurred around May 22, 2022. This actually appears to be completely healed and was great news as far as that is concerned. With that being said what he was concerned about as "not healing" was actually just scar tissue that  was remaining at that point. Fortunately I do not see anything however that appears to be open currently and I think that he is really doing quite well. 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 Allergies codiene (Severity: Moderate) Social History Never smoker, Marital Status - Married, Alcohol Use - Never, Drug Use - No History, Caffeine Use - Rarely. Medical History Cardiovascular Patient has history of Hypotension Endocrine Patient has history of Type II Diabetes Patient is treated with Insulin, Oral Agents. Blood  sugar is not tested. Medical A Surgical History Notes nd Genitourinary CKD 3 Review of Systems (ROS) Constitutional Symptoms (General Health) Denies complaints or symptoms of Fatigue, Fever, Chills, Marked Weight Change. Eyes Denies complaints or symptoms of Dry Eyes, Vision Changes, Glasses / Contacts. Ear/Nose/Mouth/Throat Denies complaints or symptoms of Difficult clearing ears, Sinusitis. Hematologic/Lymphatic Denies complaints or symptoms of Bleeding / Clotting Disorders, Human Immunodeficiency Virus. Respiratory Denies complaints or symptoms of Chronic or frequent coughs, Shortness of Breath. Gastrointestinal Denies complaints or symptoms of Frequent diarrhea, Nausea, Vomiting. Immunological Denies complaints or symptoms of Hives, Itching. Integumentary (Skin) Denies complaints or symptoms of Wounds, Bleeding or bruising tendency, Breakdown, Swelling. Musculoskeletal Denies complaints or symptoms of Muscle Pain, Muscle Weakness. Neurologic Denies complaints or symptoms of Numbness/parasthesias, Focal/Weakness. Psychiatric Denies complaints or symptoms of Anxiety, Claustrophobia. VEDANT, SHEHADEH ([id number]) [id number] Page 5 of 8 Objective Constitutional sitting or standing blood pressure is within target range for patient.. pulse regular and within  target range for patient.Marland Kitchen respirations regular, non-labored and within target range for patient.Marland Kitchen temperature within target range for patient.. Well-nourished and well-hydrated in no acute distress. Vitals Time Taken: 8:35 AM, Height: 66 in, Source: Stated, Weight: 248 lbs, Source: Stated, BMI: 40, Temperature: 98.2 F, Pulse: 79 bpm, Respiratory Rate: 18 breaths/min, Blood Pressure: 142/70 mmHg. Eyes conjunctiva clear no eyelid edema noted. pupils equal round and reactive to light and accommodation. Ears, Nose, Mouth, and Throat no gross abnormality of ear auricles or external auditory canals. normal hearing noted during conversation. mucus membranes moist. Respiratory normal breathing without difficulty. Cardiovascular 1+ dorsalis pedis/posterior tibialis pulses. no clubbing, cyanosis, significant edema, Musculoskeletal normal gait and posture. no significant deformity or arthritic changes, no loss or range of motion, no clubbing. Psychiatric this patient is able to make decisions and demonstrates good insight into disease process. Alert and Oriented x 3. pleasant and cooperative. General Notes: Upon inspection patient's wound actually appears to be completely healed which was great news and fortunately I do not see any signs of worsening overall. I am actually very pleased with where things stand at this point. In general I think that he is making good progress towards healing I do not see anything this can require intervention from wound care perspective at this point. Integumentary (Hair, Skin) Wound #1 status is Healed - Epithelialized. Original cause of wound was Trauma. The date acquired was: 05/22/2022. The wound is located on the Left Knee. The wound measures 0cm length x 0cm width x 0cm depth; 0cm^2 area and 0cm^3 volume. There is no tunneling or undermining noted. There is a none present amount of drainage noted. There is large (67-100%) red, pink granulation within the wound bed.  There is no necrotic tissue within the wound bed. Assessment Active Problems ICD-10 Type 2 diabetes mellitus with other skin ulcer Non-pressure chronic ulcer of skin of other sites with other specified severity Hypotension, unspecified Plan Discharge From [facility] Services: Consult Only - May use A and D ointment at night Follow-up Appointments: Other: - Call if needed 1. I am good recommend that the patient should continue to monitor for any signs of infection or worsening. Based on what I am seeing I do believe that he is actually completely healed which is great news. 2. I would recommend that he continue to monitor for any signs of worsening anything changes he knows to contact the office and let me know. We will see him back for follow-up visit as needed. Electronic Signature(s) Signed: 08/01/2022 8:36:43 AM By: Larina Bras,  Tanishi Nault PA-C Entered By: Allen Derry on 08/01/2022 08:36:43 Coralee North ([id number]) [id number] Page 6 of 8 -------------------------------------------------------------------------------- ROS/PFSH Details Patient Name: Date of Service: [place] GO Benedetto Coons [place] W. 07/22/2022 8:30 A M Medical Record Number: [id number] Patient Account Number: [id number] Date of Birth/Sex: Treating RN: 04/16/1945 (76 y.o. Male) Midge Aver Primary Care Provider: Saralyn Pilar Other Clinician: Referring Provider: Treating Provider/Extender: Allen Derry Self, Referral Weeks in Treatment: 0 Information Obtained From Patient Constitutional Symptoms (General Health) Complaints and Symptoms: Negative for: Fatigue; Fever; Chills; Marked Weight Change Eyes Complaints and Symptoms: Negative for: Dry Eyes; Vision Changes; Glasses / Contacts Ear/Nose/Mouth/Throat Complaints and Symptoms: Negative for: Difficult clearing ears; Sinusitis Hematologic/Lymphatic Complaints and Symptoms: Negative for: Bleeding / Clotting Disorders; Human Immunodeficiency  Virus Respiratory Complaints and Symptoms: Negative for: Chronic or frequent coughs; Shortness of Breath Gastrointestinal Complaints and Symptoms: Negative for: Frequent diarrhea; Nausea; Vomiting Immunological Complaints and Symptoms: Negative for: Hives; Itching Integumentary (Skin) Complaints and Symptoms: Negative for: Wounds; Bleeding or bruising tendency; Breakdown; Swelling Musculoskeletal Complaints and Symptoms: Negative for: Muscle Pain; Muscle Weakness Neurologic Complaints and Symptoms: Negative for: Numbness/parasthesias; Focal/Weakness Psychiatric Complaints and Symptoms: Negative for: Anxiety; GRAYSYN, BACHE ([id number]) [id number] Page 7 of 8 Cardiovascular Medical History: Positive for: Hypotension Endocrine Medical History: Positive for: Type II Diabetes Treated with: Insulin, Oral agents Blood sugar tested every day: No Genitourinary Medical History: Past Medical History Notes: CKD 3 Oncologic Immunizations Pneumococcal Vaccine: Received Pneumococcal Vaccination: Yes Received Pneumococcal Vaccination On or After 60th Birthday: Yes Implantable Devices None Family and Social History Never smoker; Marital Status - Married; Alcohol Use: Never; Drug Use: No History; Caffeine Use: Rarely Electronic Signature(s) Signed: 07/28/2022 4:06:59 PM By: Allen Derry PA-C Signed: 07/28/2022 4:43:06 PM By: Midge Aver MSN RN CNS WTA Entered By: Midge Aver on 07/26/2022 11:21:21 -------------------------------------------------------------------------------- SuperBill Details Patient Name: Date of Service: Erik Obey, KEV IN W. 07/22/2022 Medical Record Number: [id number] Patient Account Number: [id number] Date of Birth/Sex: Treating RN: March 30, 1945 (76 y.o. Male) Midge Aver Primary Care Provider: Saralyn Pilar Other Clinician: Referring Provider: Treating Provider/Extender: Allen Derry Self, Referral Weeks  in Treatment: 0 Diagnosis Coding Facility Procedures : CPT4 Code: [id number] Description: [phone] - WOUND CARE VISIT-LEV 4 EST PT Modifier: Quantity: 1 Electronic Signature(s) Signed: 07/28/2022 4:06:59 PM By: Allen Derry PA-C Signed: 07/28/2022 4:43:06 PM By: Midge Aver MSN RN CNS WTA Entered By: Midge Aver on 07/26/2022 11:29:08 Coralee North ([id number]) [id number] Page 8 of 8

## 2022-07-28 NOTE — Progress Notes (Addendum)
CLAYBURN, WEEKLY ([id number]) [id number] Page 1 of 8 Visit Report for 07/22/2022 Allergy List Details Patient Name: Date of Service: [name] [place] W. 07/22/2022 8:30 A M Medical Record Number: [id number] Patient Account Number: [id number] Date of Birth/Sex: Treating RN: 1945-12-04 (77 y.o. Male) Midge Aver Primary Care Laiken Nohr: Saralyn Pilar Other Clinician: Referring Jenniffer Vessels: Treating Marylou Wages/Extender: Allen Derry Self, Referral Weeks in Treatment: 0 Allergies Active Allergies codiene Severity: Moderate Type: Medication Allergy Notes Electronic Signature(s) Signed: 07/28/2022 4:43:06 PM By: Midge Aver MSN RN CNS WTA Entered By: Midge Aver on 07/26/2022 11:18:08 -------------------------------------------------------------------------------- Arrival Information Details Patient Name: Date of Service: [name], [place] W. 07/22/2022 8:30 A M Medical Record Number: [id number] Patient Account Number: [id number] Date of Birth/Sex: Treating RN: 12-29-1945 (77 y.o. Male) Midge Aver Primary Care Sajad Glander: Saralyn Pilar Other Clinician: Referring Breezy Hertenstein: Treating Lanora Reveron/Extender: Allen Derry Self, Referral Weeks in Treatment: 0 Visit Information Patient Arrived: Ambulatory Arrival Time: 08:35 Accompanied By: self Transfer Assistance: None Patient Identification Verified: Yes Secondary Verification Process Completed: Yes Patient Requires Transmission-Based Precautions: No Patient Has Alerts: Yes Patient Alerts: DIABETIC TYPE 2 ASA [name] ([id number]) [id number] Page 2 of 8 Electronic Signature(s) Signed: 07/28/2022 4:43:06 PM By: Midge Aver MSN RN CNS WTA Entered By: Midge Aver on 07/26/2022 11:16:26 -------------------------------------------------------------------------------- Clinic Level of Care Assessment Details Patient Name: Date of Service: Rosalita Chessman IN W. 07/22/2022  8:30 A M Medical Record Number: [id number] Patient Account Number: [id number] Date of Birth/Sex: Treating RN: 01-Nov-1945 (77 y.o. Male) Midge Aver Primary Care Quill Grinder: Saralyn Pilar Other Clinician: Referring Maricel Swartzendruber: Treating Nylani Michetti/Extender: Allen Derry Self, Referral Weeks in Treatment: 0 Clinic Level of Care Assessment Items TOOL 2 Quantity Score X- 1 0 Use when only an EandM is performed on the INITIAL visit ASSESSMENTS - Nursing Assessment / Reassessment X- 1 20 General Physical Exam (combine w/ comprehensive assessment (listed just below) when performed on new pt. evals) X- 1 25 Comprehensive Assessment (HX, ROS, Risk Assessments, Wounds Hx, etc.) ASSESSMENTS - Wound and Skin A ssessment / Reassessment X - Simple Wound Assessment / Reassessment - one wound 1 5 []  - 0 Complex Wound Assessment / Reassessment - multiple wounds []  - 0 Dermatologic / Skin Assessment (not related to wound area) ASSESSMENTS - Ostomy and/or Continence Assessment and Care []  - 0 Incontinence Assessment and Management []  - 0 Ostomy Care Assessment and Management (repouching, etc.) PROCESS - Coordination of Care X - Simple Patient / Family Education for ongoing care 1 15 []  - 0 Complex (extensive) Patient / Family Education for ongoing care X- 1 10 Staff obtains Chiropractor, Records, T Results / Process Orders est []  - 0 Staff telephones HHA, Nursing Homes / Clarify orders / etc []  - 0 Routine Transfer to another Facility (non-emergent condition) []  - 0 Routine Hospital Admission (non-emergent condition) X- 1 15 New Admissions / Manufacturing engineer / Ordering NPWT Apligraf, etc. , []  - 0 Emergency Hospital Admission (emergent condition) X- 1 10 Simple Discharge Coordination []  - 0 Complex (extensive) Discharge Coordination PROCESS - Special Needs []  - 0 Pediatric / Minor Patient Management []  - 0 Isolation Patient Management []  - 0 Hearing / Language / Visual  special needs []  - 0 Assessment of Community assistance (transportation, D/C planning, etc.) []  - 0 Additional assistance / Altered mentation [name], [name] ([id number]) [id number] Page 3 of 8 []  - 0 Support Surface(s) Assessment (bed, cushion, seat, etc.) INTERVENTIONS - Wound Cleansing / Measurement X- 1 5 Wound Imaging (  photographs - any number of wounds) []  - 0 Wound Tracing (instead of photographs) X- 1 5 Simple Wound Measurement - one wound []  - 0 Complex Wound Measurement - multiple wounds X- 1 5 Simple Wound Cleansing - one wound []  - 0 Complex Wound Cleansing - multiple wounds INTERVENTIONS - Wound Dressings []  - 0 Small Wound Dressing one or multiple wounds []  - 0 Medium Wound Dressing one or multiple wounds []  - 0 Large Wound Dressing one or multiple wounds []  - 0 Application of Medications - injection INTERVENTIONS - Miscellaneous []  - 0 External ear exam []  - 0 Specimen Collection (cultures, biopsies, blood, body fluids, etc.) []  - 0 Specimen(s) / Culture(s) sent or taken to Lab for analysis []  - 0 Patient Transfer (multiple staff / Nurse, adult / Similar devices) []  - 0 Simple Staple / Suture removal (25 or less) []  - 0 Complex Staple / Suture removal (26 or more) []  - 0 Hypo / Hyperglycemic Management (close monitor of Blood Glucose) X- 1 15 Ankle / Brachial Index (ABI) - do not check if billed separately Has the patient been seen at the hospital within the last three years: Yes Total Score: 130 Level Of Care: New/Established - Level 4 Electronic Signature(s) Signed: 07/28/2022 4:43:06 PM By: Midge Aver MSN RN CNS WTA Entered By: Midge Aver on 07/26/2022 11:29:03 -------------------------------------------------------------------------------- Encounter Discharge Information Details Patient Name: Date of Service: [name], [place] W. 07/22/2022 8:30 A M Medical Record Number: [id number] Patient Account Number:  [id number] Date of Birth/Sex: Treating RN: Mar 14, 1945 (77 y.o. Male) Midge Aver Primary Care Jeanelle Dake: Saralyn Pilar Other Clinician: Referring Arvon Schreiner: Treating Kearston Putman/Extender: Allen Derry Self, Referral Weeks in Treatment: 0 Encounter Discharge Information Items Discharge Condition: Pratt Ambulatory Status: Ambulatory Discharge Destination: Home Transportation: Private Auto Accompanied By: self Schedule Follow-up Appointment: No [name] ([id number]) [id number] Page 4 of 8 Clinical Summary of Care: Electronic Signature(s) Signed: 07/28/2022 4:43:06 PM By: Midge Aver MSN RN CNS WTA Entered By: Midge Aver on 07/26/2022 11:30:46 -------------------------------------------------------------------------------- Lower Extremity Assessment Details Patient Name: Date of Service: [address] [name] [place] W. 07/22/2022 8:30 A M Medical Record Number: [id number] Patient Account Number: [id number] Date of Birth/Sex: Treating RN: October 11, 1945 (76 y.o. Male) Midge Aver Primary Care Jereme Loren: Saralyn Pilar Other Clinician: Referring Macgregor Aeschliman: Treating Vaishali Baise/Extender: Allen Derry Self, Referral Weeks in Treatment: 0 Edema Assessment Assessed: [Left: Yes] [Right: Yes] [Left: Edema] [Right: :] Vascular Assessment Pulses: Dorsalis Pedis Palpable: [Left:Yes] [Right:Yes] Popliteal Doppler Audible: [Left:Yes] [Right:Yes] Blood Pressure: Brachial: [Left:142] [Right:142] Ankle: [Left:Dorsalis Pedis: 120 0.85] [Right:Dorsalis Pedis: 110 0.77] Electronic Signature(s) Signed: 07/28/2022 4:43:06 PM By: Midge Aver MSN RN CNS WTA Entered By: Midge Aver on 07/26/2022 11:27:21 -------------------------------------------------------------------------------- Multi Wound Chart Details Patient Name: Date of Service: [name], [place] W. 07/22/2022 8:30 A M Medical Record Number: [id number] Patient Account Number: [id number] Date of Birth/Sex:  Treating RN: 06-09-1945 (76 y.o. Male) Midge Aver Primary Care Shakeena Kafer: Saralyn Pilar Other Clinician: Referring Marlisha Vanwyk: Treating Chanel Mckesson/Extender: Allen Derry Self, Referral Weeks in Treatment: 0 Vital Signs [name] ([id number]) [id number] Page 5 of 8 Height(in): 66 Pulse(bpm): 79 Weight(lbs): 248 Blood Pressure(mmHg): 142/70 Body Mass Index(BMI): 40 Temperature(F): 98.2 Respiratory Rate(breaths/min): 18 [1:Photos:] [N/A:N/A] Left Knee N/A N/A Wound Location: Trauma N/A N/A Wounding Event: Abrasion N/A N/A Primary Etiology: Hypotension, Type II Diabetes N/A N/A Comorbid History: 05/22/2022 N/A N/A Date Acquired: 0 N/A N/A Weeks of Treatment: Healed - Epithelialized N/A N/A Wound Status: No N/A N/A Wound Recurrence: 0x0x0  N/A N/A Measurements L x W x D (cm) 0 N/A N/A A (cm) : rea 0 N/A N/A Volume (cm) : Partial Thickness N/A N/A Classification: None Present N/A N/A Exudate A mount: Large (67-100%) N/A N/A Granulation A mount: Red, Pink N/A N/A Granulation Quality: None Present (0%) N/A N/A Necrotic A mount: Fascia: No N/A N/A Exposed Structures: Fat Layer (Subcutaneous Tissue): No Tendon: No Muscle: No Joint: No Bone: No Large (67-100%) N/A N/A Epithelialization: Treatment Notes Electronic Signature(s) Signed: 07/28/2022 4:43:06 PM By: Midge Aver MSN RN CNS WTA Entered By: Midge Aver on 07/26/2022 11:27:32 -------------------------------------------------------------------------------- Multi-Disciplinary Care Plan Details Patient Name: Date of Service: [name], [place] W. 07/22/2022 8:30 A M Medical Record Number: [id number] Patient Account Number: [id number] Date of Birth/Sex: Treating RN: [birth year] (76 y.o. Male) Midge Aver Primary Care Daltyn Degroat: Saralyn Pilar Other Clinician: Referring Ramond Darnell: Treating Raenette Sakata/Extender: Allen Derry Self, Referral Weeks in Treatment: 0 Active  Inactive Electronic Signature(s) [name] ([id number]) [id number] Page 6 of 8 Signed: 07/28/2022 4:43:06 PM By: Midge Aver MSN RN CNS WTA Entered By: Midge Aver on 07/26/2022 11:29:14 -------------------------------------------------------------------------------- Pain Assessment Details Patient Name: Date of Service: [address] [place] W. 07/22/2022 8:30 A M Medical Record Number: [id number] Patient Account Number: [id number] Date of Birth/Sex: Treating RN: 08-Nov-1945 (76 y.o. Male) Midge Aver Primary Care Soleil Mas: Saralyn Pilar Other Clinician: Referring Saryah Loper: Treating Rabecka Brendel/Extender: Allen Derry Self, Referral Weeks in Treatment: 0 Active Problems Location of Pain Severity and Description of Pain Patient Has Paino No Site Locations Pain Management and Medication Current Pain Management: Electronic Signature(s) Signed: 07/28/2022 4:43:06 PM By: Midge Aver MSN RN CNS WTA Entered By: Midge Aver on 07/26/2022 11:16:34 -------------------------------------------------------------------------------- Patient/Caregiver Education Details Patient Name: Date of Service: [name], [name] IN W. 7/19/2024andnbsp8:30 A M Medical Record Number: [id number] Patient Account Number: [id number] [name], [name] ([id number]) [id number] Page 7 of 8 Date of Birth/Gender: Treating RN: [birth year] (77 y.o. Male) Midge Aver Primary Care Physician: Saralyn Pilar Other Clinician: Referring Physician: Treating Physician/Extender: Allen Derry Self, Referral Weeks in Treatment: 0 Education Assessment Education Provided To: Patient Education Topics Provided Discharge Packet: Handouts: Fall Prevention and Safe Transfers Methods: Explain/Verbal Responses: State content correctly Electronic Signature(s) Signed: 07/28/2022 4:43:06 PM By: Midge Aver MSN RN CNS WTA Entered By: Midge Aver on 07/26/2022  11:29:50 -------------------------------------------------------------------------------- Wound Assessment Details Patient Name: Date of Service: [name], [name] IN W. 07/22/2022 8:30 A M Medical Record Number: [id number] Patient Account Number: [id number] Date of Birth/Sex: Treating RN: [birth year] (76 y.o. Male) Midge Aver Primary Care Jeanluc Wegman: Saralyn Pilar Other Clinician: Referring Latissa Frick: Treating Letonya Mangels/Extender: Allen Derry Self, Referral Weeks in Treatment: 0 Wound Status Wound Number: 1 Primary Etiology: Abrasion Wound Location: Left Knee Wound Status: Healed - Epithelialized Wounding Event: Trauma Comorbid History: Hypotension, Type II Diabetes Date Acquired: 05/22/2022 Weeks Of Treatment: 0 Clustered Wound: No Photos Wound Measurements Length: (cm) Width: (cm) Depth: (cm) Area: (cm) Volume: (cm) [name] ([id number]) Wound Description Classification: Partial Thickness Exudate Amount: None Present Foul Odor After Cleansing: Slough/Fibrino 0 % Reduction in Area: 0 % Reduction in Volume: 0 Epithelialization: Large (67-100%) 0 Tunneling: No 0 Undermining: No [id number] Page 8 of 8 No No Wound Bed Granulation Amount: Large (67-100%) Exposed Structure Granulation Quality: Red, Pink Fascia Exposed: No Necrotic Amount: None Present (0%) Fat Layer (Subcutaneous Tissue) Exposed: No Tendon Exposed: No Muscle Exposed: No Joint Exposed: No Bone Exposed: No Electronic Signature(s) Signed: 07/28/2022 4:43:06 PM By: Midge Aver MSN RN  CNS WTA Entered By: Midge Aver on 07/26/2022 11:26:04 -------------------------------------------------------------------------------- Vitals Details Patient Name: Date of Service: [name] [place] W. 07/22/2022 8:30 A M Medical Record Number: [id number] Patient Account Number: [id number] Date of Birth/Sex: Treating RN: July 13, 1945 (76 y.o. Male) Midge Aver Primary Care Milika Ventress:  Saralyn Pilar Other Clinician: Referring Anarely Nicholls: Treating Prairie Stenberg/Extender: Allen Derry Self, Referral Weeks in Treatment: 0 Vital Signs Time Taken: 08:35 Temperature (F): 98.2 Height (in): 66 Pulse (bpm): 79 Source: Stated Respiratory Rate (breaths/min): 18 Weight (lbs): 248 Blood Pressure (mmHg): 142/70 Source: Stated Reference Range: 80 - 120 mg / dl Body Mass Index (BMI): 40 Electronic Signature(s) Signed: 07/28/2022 4:43:06 PM By: Midge Aver MSN RN CNS WTA Entered By: Midge Aver on 07/26/2022 11:17:42

## 2022-07-28 NOTE — Progress Notes (Signed)
IMER, FOXWORTH ([id number]) [phone] [id number] Page 1 of 5 Visit Report for 07/22/2022 Abuse Risk Screen Details Patient Name: Date of Service: [name] [place] W. 07/22/2022 8:30 A M Medical Record Number: [id number] Patient Account Number: [id number] Date of Birth/Sex: Treating RN: 1945/02/29 (77 y.o. Male) Midge Aver Primary Care Liliah Dorian: Saralyn Pilar Other Clinician: Referring Sherrel Ploch: Treating Vibhav Waddill/Extender: Allen Derry Self, Referral Weeks in Treatment: 0 Abuse Risk Screen Items Answer Electronic Signature(s) Signed: 07/28/2022 4:43:06 PM By: Midge Aver MSN RN CNS WTA Entered By: Midge Aver on 07/26/2022 11:21:25 -------------------------------------------------------------------------------- Activities of Daily Living Details Patient Name: Date of Service: [name] [place] W. 07/22/2022 8:30 A M Medical Record Number: [id number] Patient Account Number: [id number] Date of Birth/Sex: Treating RN: [birth year] (76 y.o. Male) Midge Aver Primary Care Adreona Brand: Saralyn Pilar Other Clinician: Referring Myrtice Lowdermilk: Treating Purnell Daigle/Extender: Allen Derry Self, Referral Weeks in Treatment: 0 Activities of Daily Living Items Answer Activities of Daily Living (Please select one for each item) Drive Automobile Completely Able T Medications ake Completely Able Use T elephone Completely Able Care for Appearance Completely Able Use T oilet Completely Able Bath / Shower Completely Able Dress Self Completely Able Feed Self Completely Able Walk Completely Able Get In / Out Bed Completely Able Housework Completely Able Prepare Meals Completely Able Handle Money Completely Able Shop for Self Completely [name], [name] ([id number]) [id number] Page 2 of 5 Electronic Signature(s) Signed: 07/28/2022 4:43:06 PM By: Midge Aver MSN RN CNS WTA Entered By: Midge Aver on 07/26/2022  11:21:40 -------------------------------------------------------------------------------- Education Screening Details Patient Name: Date of Service: [address] [place], [name] [place] W. 07/22/2022 8:30 A M Medical Record Number: [id number] Patient Account Number: [id number] Date of Birth/Sex: Treating RN: 1945/09/26 (76 y.o. Male) Midge Aver Primary Care Anikah Hogge: Saralyn Pilar Other Clinician: Referring Ander Wamser: Treating Borna Wessinger/Extender: Allen Derry Self, Referral Weeks in Treatment: 0 Learning Preferences/Education Level/Primary Language Learning Preference: Explanation, Demonstration Highest Education Level: College or Above Preferred Language: English Cognitive Barrier Language Barrier: No Translator Needed: No Memory Deficit: No Emotional Barrier: No Cultural/Religious Beliefs Affecting Medical Care: No Physical Barrier Impaired Vision: No Impaired Hearing: No Decreased Hand dexterity: No Knowledge/Comprehension Knowledge Level: High Comprehension Level: High Ability to understand written instructions: High Ability to understand verbal instructions: High Motivation Anxiety Level: Calm Cooperation: Cooperative Education Importance: Acknowledges Need Interest in Health Problems: Asks Questions Perception: Coherent Willingness to Engage in Self-Management High Activities: Readiness to Engage in Self-Management High Activities: Electronic Signature(s) Signed: 07/28/2022 4:43:06 PM By: Midge Aver MSN RN CNS WTA Entered By: Midge Aver on 07/26/2022 11:22:08 [name] ([id number]) [id number] Page 3 of 5 -------------------------------------------------------------------------------- Fall Risk Assessment Details Patient Name: Date of Service: [name] [place] W. 07/22/2022 8:30 A M Medical Record Number: [id number] Patient Account Number: [id number] Date of Birth/Sex: Treating RN: Sep 04, 1945 (76 y.o. Male) Midge Aver Primary  Care Paislynn Hegstrom: Saralyn Pilar Other Clinician: Referring Sayf Kerner: Treating Kaniel Kiang/Extender: Allen Derry Self, Referral Weeks in Treatment: 0 Fall Risk Assessment Items Have you had 2 or more falls in the last 12 monthso 0 Yes Have you had any fall that resulted in injury in the last 12 monthso 0 Yes FALLS RISK SCREEN History of falling - immediate or within 3 months 25 Yes Secondary diagnosis (Do you have 2 or more medical diagnoseso) 0 No Ambulatory aid None/bed rest/wheelchair/nurse 0 No Crutches/cane/walker 0 No Furniture 0 No Intravenous therapy Access/Saline/Heparin Lock 0 No Gait/Transferring Normal/ bed rest/ wheelchair 0 No Weak (short steps with  or without shuffle, stooped but able to lift head while walking, may seek 0 No support from furniture) Impaired (short steps with shuffle, may have difficulty arising from chair, head down, impaired 0 No balance) Mental Status Oriented to own ability 0 Yes Electronic Signature(s) Signed: 07/28/2022 4:43:06 PM By: Midge Aver MSN RN CNS WTA Entered By: Midge Aver on 07/26/2022 11:22:28 -------------------------------------------------------------------------------- Foot Assessment Details Patient Name: Date of Service: [name], [name] [place] W. 07/22/2022 8:30 A M Medical Record Number: [id number] Patient Account Number: [id number] Date of Birth/Sex: Treating RN: 1945-07-15 (76 y.o. Male) Midge Aver Primary Care Ashlyne Olenick: Saralyn Pilar Other Clinician: Referring Loral Campi: Treating Schneur Crowson/Extender: Allen Derry Self, Referral Weeks in Treatment: 0 Foot Assessment Items Site Locations [name], [name] ([id number]) [phone] [id number] Page 4 of 5 + = Sensation present, - = Sensation absent, C = Callus, U = Ulcer R = Redness, W = Warmth, M = Maceration, PU = Pre-ulcerative lesion F = Fissure, S = Swelling, D = Dryness Assessment Right: Left: Other Deformity: No No Prior Foot Ulcer:  No No Prior Amputation: No No Charcot Joint: No No Ambulatory Status: Ambulatory Without Help Gait: Steady Electronic Signature(s) Signed: 07/28/2022 4:43:06 PM By: Midge Aver MSN RN CNS WTA Entered By: Midge Aver on 07/26/2022 11:22:50 -------------------------------------------------------------------------------- Nutrition Risk Screening Details Patient Name: Date of Service: [address] [place] W. 07/22/2022 8:30 A M Medical Record Number: [id number] Patient Account Number: [id number] Date of Birth/Sex: Treating RN: 1945/02/09 (76 y.o. Male) Midge Aver Primary Care Raianna Slight: Saralyn Pilar Other Clinician: Referring Kaidence Sant: Treating Merlyn Bollen/Extender: Allen Derry Self, Referral Weeks in Treatment: 0 Height (in): 66 Weight (lbs): 248 Body Mass Index (BMI): 40 Nutrition Risk Screening Items Score Screening NUTRITION RISK SCREEN: I have an illness or condition that made me change the kind and/or amount of food I eat 0 No I eat fewer than two meals per day 0 No I eat few fruits and vegetables, or milk products 0 No I have three or more drinks of beer, liquor or wine almost every day 0 No I have tooth or mouth problems that make it hard for me to eat 0 No I don't always have enough money to buy the food I need 0 No [name], [name] ([id number]) [id number] Page 5 of 5 I eat alone most of the time 0 No I take three or more different prescribed or over-the-counter drugs a day 1 Yes Without wanting to, I have lost or gained 10 pounds in the last six months 0 No I am not always physically able to shop, cook and/or feed myself 0 No Nutrition Protocols Good Risk Protocol 0 No interventions needed Moderate Risk Protocol High Risk Proctocol Risk Level: Good Risk Score: 1 Electronic Signature(s) Signed: 07/28/2022 4:43:06 PM By: Midge Aver MSN RN CNS WTA Entered By: Midge Aver on 07/26/2022 11:22:38

## 2022-09-09 ENCOUNTER — Other Ambulatory Visit: Payer: Self-pay | Admitting: Family Medicine

## 2022-09-09 DIAGNOSIS — N183 Chronic kidney disease, stage 3 unspecified: Secondary | ICD-10-CM

## 2022-09-09 DIAGNOSIS — E1121 Type 2 diabetes mellitus with diabetic nephropathy: Secondary | ICD-10-CM

## 2022-09-09 LAB — PROTEIN / CREATININE RATIO, URINE: Creatinine, Urine: 73

## 2022-09-09 LAB — MICROALBUMIN, URINE: Microalb, Ur: 0.2

## 2022-09-09 LAB — MICROALBUMIN / CREATININE URINE RATIO: Microalb Creat Ratio: 3

## 2022-09-11 ENCOUNTER — Other Ambulatory Visit: Payer: Self-pay | Admitting: Family Medicine

## 2022-09-13 NOTE — Telephone Encounter (Signed)
Requested Prescriptions  Pending Prescriptions Disp Refills   tamsulosin (FLOMAX) 0.4 MG CAPS capsule [Pharmacy Med Name: TAMSULOSIN CAP 0.4MG ] 90 capsule 1    Sig: TAKE 1 CAPSULE DAILY     Urology: Alpha-Adrenergic Blocker Passed - 09/11/2022  1:02 PM      Passed - PSA in normal range and within 360 days    PSA  Date Value Ref Range Status  04/27/2022 1.31 < OR = 4.00 ng/mL Final    Comment:    The total PSA value from this assay system is  standardized against the WHO standard. The test  result will be approximately 20% lower when compared  to the equimolar-standardized total PSA (Beckman  Coulter). Comparison of serial PSA results should be  interpreted with this fact in mind. . This test was performed using the Siemens  chemiluminescent method. Values obtained from  different assay methods cannot be used interchangeably. PSA levels, regardless of value, should not be interpreted as absolute evidence of the presence or absence of disease.          Passed - Last BP in normal range    BP Readings from Last 1 Encounters:  06/15/22 104/65         Passed - Valid encounter within last 12 months    Recent Outpatient Visits           4 months ago Annual physical exam   [place] [facility] [place], [name], DO   7 months ago Type 2 diabetes mellitus with diabetic nephropathy, with long-term current use of insulin [facility])   [place] [facility] [place], [name], DO   10 months ago Type 2 diabetes mellitus with diabetic nephropathy, with long-term current use of insulin [facility])   [place] [facility] [place], [name], DO   1 year ago Type 2 diabetes mellitus with diabetic nephropathy, with long-term current use of insulin [facility])   [place] [facility] [name], DO   1 year ago Annual physical exam   [place] [facility] [name], DO        Future Appointments             In 1 month Althea Charon, [name], DO [place] [facility], [place]   In 9 months [name], MD [facility]

## 2022-09-27 ENCOUNTER — Ambulatory Visit: Payer: Federal, State, Local not specified - PPO | Admitting: Podiatry

## 2022-09-27 DIAGNOSIS — E119 Type 2 diabetes mellitus without complications: Secondary | ICD-10-CM

## 2022-09-27 DIAGNOSIS — M79675 Pain in left toe(s): Secondary | ICD-10-CM

## 2022-09-27 DIAGNOSIS — B351 Tinea unguium: Secondary | ICD-10-CM | POA: Diagnosis not present

## 2022-09-27 DIAGNOSIS — M79674 Pain in right toe(s): Secondary | ICD-10-CM

## 2022-09-27 NOTE — Progress Notes (Signed)
This patient returns to my office for at risk foot care.  This patient requires this care by a professional since this patient will be at risk due to having  diabetes.  This patient is unable to cut nails himself since the patient cannot reach his nails.These nails are painful walking and wearing shoes.  This patient presents for at risk foot care today.  General Appearance  Alert, conversant and in no acute stress.  Vascular  Dorsalis pedis and posterior tibial  pulses are palpable  bilaterally.  Capillary return is within normal limits  bilaterally. Temperature is within normal limits  bilaterally.  Neurologic  Senn-Weinstein monofilament wire test within normal limits  bilaterally. Muscle power within normal limits bilaterally.  Nails Thick disfigured discolored nails with subungual debris  hallux nails  bilaterally. No evidence of bacterial infection or drainage bilaterally.  Orthopedic  No limitations of motion  feet .  No crepitus or effusions noted.  No bony pathology or digital deformities noted.  Skin  normotropic skin with no porokeratosis noted bilaterally.  No signs of infections or ulcers noted.     Onychomycosis  Pain in right toes  Pain in left toes  Consent was obtained for treatment procedures.   Mechanical debridement of nails 1-5  bilaterally performed with a nail nipper.  Filed with dremel without incident.    Return office visit     3 months                Told patient to return for periodic foot care and evaluation due to potential at risk complications.   Nicholes Rough D.P.M.

## 2022-11-07 ENCOUNTER — Ambulatory Visit: Payer: Federal, State, Local not specified - PPO | Admitting: Family Medicine

## 2022-11-08 ENCOUNTER — Ambulatory Visit: Payer: Federal, State, Local not specified - PPO | Admitting: Family Medicine

## 2022-11-08 ENCOUNTER — Other Ambulatory Visit: Payer: Self-pay | Admitting: Family Medicine

## 2022-11-08 ENCOUNTER — Encounter: Payer: Self-pay | Admitting: Family Medicine

## 2022-11-08 VITALS — BP 98/52 | HR 73 | Ht 65.0 in | Wt 255.2 lb

## 2022-11-08 DIAGNOSIS — Z794 Long term (current) use of insulin: Secondary | ICD-10-CM

## 2022-11-08 DIAGNOSIS — N400 Enlarged prostate without lower urinary tract symptoms: Secondary | ICD-10-CM

## 2022-11-08 DIAGNOSIS — E1121 Type 2 diabetes mellitus with diabetic nephropathy: Secondary | ICD-10-CM | POA: Diagnosis not present

## 2022-11-08 DIAGNOSIS — D333 Benign neoplasm of cranial nerves: Secondary | ICD-10-CM

## 2022-11-08 DIAGNOSIS — I129 Hypertensive chronic kidney disease with stage 1 through stage 4 chronic kidney disease, or unspecified chronic kidney disease: Secondary | ICD-10-CM | POA: Diagnosis not present

## 2022-11-08 DIAGNOSIS — E1169 Type 2 diabetes mellitus with other specified complication: Secondary | ICD-10-CM

## 2022-11-08 DIAGNOSIS — N183 Chronic kidney disease, stage 3 unspecified: Secondary | ICD-10-CM

## 2022-11-08 DIAGNOSIS — Z0000 Encounter for general adult medical examination without abnormal findings: Secondary | ICD-10-CM

## 2022-11-08 DIAGNOSIS — M1A0790 Idiopathic chronic gout, unspecified ankle and foot, without tophus (tophi): Secondary | ICD-10-CM

## 2022-11-08 DIAGNOSIS — I25810 Atherosclerosis of coronary artery bypass graft(s) without angina pectoris: Secondary | ICD-10-CM

## 2022-11-08 LAB — POCT GLYCOSYLATED HEMOGLOBIN (HGB A1C): Hemoglobin A1C: 7.5 % — AB (ref 4.0–5.6)

## 2022-11-08 NOTE — Assessment & Plan Note (Signed)
Well-controlled HTN Complication with CKDIIIb range, CAD, edema Followed by Nephrology    Plan:  1. Continue current BP HALF tabs Telmisartan-HCTZ 80-25mg  (HALF tab) daily -Carvedilol 25mg  BID - Torsemide 40mg  daily  2. Encourage improved lifestyle - low sodium diet, regular exercise 3. Continue monitor BP outside office, bring readings to next visit, if persistently >140/90 or new symptoms notify office sooner

## 2022-11-08 NOTE — Assessment & Plan Note (Signed)
Controlled DM with A1c 7.5 improved control on insulin regimen per Endocrinology Complications - CKD III, Hyperlipidemia, GERD, obesity, CAD -  increases risk of future cardiovascular complications   Complication HYPOGLYCEMIA with decline in renal function  Off Metformin due to CKD  Plan:  1. Discussion on Hypoglycemia again, he is using glucose tablets and has GVOKE, he is monitoring closely on CGM and followed by Endocrine  - Continue current therapy -  Tresiba, Lispro adjusting doses per Endocrinology  2. Encourage improved lifestyle - low carb, low sugar diet, reduce portion size, continue improving regular exercise 3. Check CBG , bring log to next visit for review 4. Continue ASA,  ARB, Statin  Use GVOKE emergency pen if glucose 54 or less or symptoms hypoglycemia. Instead of oral glucose intake.

## 2022-11-08 NOTE — Assessment & Plan Note (Addendum)
Followed by [facility] ENT Surgically treated in 2015 Now recurrence on recent MRI Chronic loss of hearing R ear. Has reduced hearing L ear

## 2022-11-08 NOTE — Progress Notes (Signed)
Subjective:    Patient ID: [name], male    DOB: 08-27-1945, 77 y.o.   MRN: [id number]  [name] is a 77 y.o. male presenting on 11/08/2022 for Diabetes   HPI  Discussed the use of AI scribe software for clinical note transcription with the patient, who gave verbal consent to proceed.   The patient, with a known history of a right-sided acoustic neuroma, reports that recent imaging at [facility] revealed tumor growth. He was initially diagnosed in 2015 and underwent radiation therapy with the expectation of tumor shrinkage. However, the tumor has remained largely stable over the past two years. The patient reports complete hearing loss in the right ear, which he attributes to the tumor's location on the hearing and balance nerves. He is under the impression that the only treatment option is brain surgery, which he considers not an option due to his age and the procedure's complexity. He anticipates that if any treatment is pursued, it would likely be radiation therapy.  The patient also has a history of diabetes, which he manages with a sliding scale of Novolog, 66 units of Tresiba, and glyburide, mostly taken in the morning. He reports a recent HbA1c of 7.5, the highest it has ever been, but expresses a preference for slightly higher blood sugars to avoid dangerous nocturnal hypoglycemia. He has experienced rapid drops in blood sugar, from over 300 to 54 within three hours. He manages hypoglycemic episodes with glucose tablets and has a glucagon rescue pen on hand for emergencies.  The patient also has a history of kidney disease, with a recent GFR above 30 for the first time in four years. He reports difficulty adhering to a low-sodium diet due to his kidney disease. He also mentions a history of arthritis, which has improved with daily Tylenol use. He reports no current pain in his shoulder or back.  The patient has been vaccinated against shingles and received his last dose in 2024.  He also received the RSV vaccine approximately one to two years ago. He reports no current need for any additional vaccinations.       Specialists Nephrology - Dr Mosetta Pigeon [facility]) Endocrinology - Dr Verdis Frederickson [facility] Endocrinology) Cardiology - Dr Arnoldo Hooker [facility] Cardiology) ENT - Dr Margarito Courser [facility] ENT)  CHRONIC DM, Type 2: Chronic problem.  Followed by [facility] Endocrinology Dr Gershon Crane Today A1c 7.5 CGM Managed on Basal insulin Tresiba and Humalog mealtime, also Glyburide per Endocrine  - Currently on Tresiba 66 units, Novolog mealtime SSI, if >200 can do 3 units every 50g and on - On Glyburide 5mg  x 2 in AM with meal and occasional in PM OFF Metformin Jardiance  He does admit to overnight hypoglycemia, resolved with oral glucose intake, he has GVOKE pen as emergency if needed. He is using CGM and has had had a much better handle on his sugar control overall. Reports  good compliance. Tolerating well w/o side-effects Currently on ARB Has [facility]) Admits occasional hypoglycemia Denies polyuria, visual changes, numbness or tingling  Osteoarthritis Additional update now he takes Tylenol x 2 = 500mg  regularly and it helps manage arthritis pains.   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 Asks about swap back to Furosemide, has swelling  Right Acoustic Neuroma Background history he had initial diagnosis in 2015 with radiation received to drink the tumor at that time. Followed by [facility] ENT / Audiology. He had MRI 11/4 and evaluation yesterday. He had delayed follow up on this issue for period of time but now has returned. He says hearing is lost  in R ear at this time.      11/08/2022    9:12 AM 05/05/2022    8:38 AM 01/31/2022    1:32 PM  Depression screen PHQ 2/9  Decreased Interest 0 0 0  Down, Depressed, Hopeless 0 0 0  PHQ - 2 Score 0 0 0  Altered sleeping 0    Tired, decreased energy 0    Change in appetite 0    Feeling bad or failure about yourself  0    Trouble concentrating 0    Moving slowly or fidgety/restless 0    Suicidal thoughts 0    PHQ-9 Score 0    Difficult doing work/chores Not difficult at all      Social History   Tobacco Use   Smoking status: Never   Smokeless tobacco: Never   Tobacco comments:    tobacco use- no   Vaping Use   Vaping status: Never Used  Substance Use Topics   Alcohol use: Never   Drug use: No    Review of Systems Per HPI unless specifically indicated above     Objective:    BP (!) 98/52   Pulse 73   Ht 5\' 5"  (1.651 m)   Wt 255 lb 3.2 oz (115.8 kg)   SpO2 97%   BMI 42.47 kg/m   Wt Readings from Last 3 Encounters:  11/08/22 255 lb 3.2 oz (115.8 kg)  05/10/22 249 lb (112.9 kg)  05/05/22 249 lb (112.9 kg)    Physical Exam Vitals and nursing note reviewed.  Constitutional:      General: He is not in acute distress.    Appearance: Normal appearance. He is well-developed. He is not diaphoretic.     Comments: Well-appearing, comfortable, cooperative  HENT:     Head: Normocephalic and atraumatic.  Eyes:     General:        Right eye: No discharge.        Left eye: No discharge.     Conjunctiva/sclera: Conjunctivae normal.  Cardiovascular:     Rate and Rhythm: Normal rate.  Pulmonary:     Effort: Pulmonary effort is normal.  Skin:    General: Skin is warm and dry.     Findings: No erythema or rash.  Neurological:     Mental Status: He is alert and oriented to person, place, and time.  Psychiatric:        Mood and Affect: Mood normal.        Behavior: Behavior normal.        Thought Content: Thought content normal.     Comments: Well groomed, good eye  contact, normal speech and thoughts     Recent Labs    01/26/22 0818 04/27/22 1329 11/08/22 0924  HGBA1C 6.9* 7.3* 7.5*    I have personally reviewed the radiology report from 11/07/22 on MRI Brain.  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  Structured report code: NR.MR19   COMPARISON: Multiple prior  internal auditory canal MRIs with the most  recent performed 01/23/2019   FINDINGS:  TEMPORAL BONES:  Redemonstrated mass expanding the right internal auditory canal extending  into the right cerebellopontine angle. This has minimally increased in size  measuring 8 x 13 x 7 mm, previously 7 x 11 x 6 mm when measured in a  similar fashion. Normal appearance of the inner ear structures on high  resolution T2 images. No vestibular aqueduct enlargement. Mastoid air cells  and middle ear cavities are clear.   PARENCHYMA:  No evidence of infarction. No recent parenchymal hemorrhage. Several foci  of susceptibility artifact in the supratentorial and infratentorial  parenchyma likely represent sequelae of prior microhemorrhage. No mass or  shift of structures across the midline.  Mild diffuse parenchymal volume  loss.   EXTRA-AXIAL SPACES:  No extra-axial collection. No extra-axial mass.   VENTRICLES:  No hydrocephalus.   VESSELS:  The flow voids are normal.   BONES:  Unremarkable.   ORBITS:  No significant abnormality. Bilateral lens replacements.   PARANASAL SINUSES:  Scattered minor mural thickening predominantly within the ethmoid air  cells.   EXTRACRANIAL SOFT TISSUES:  Unremarkable.   IMPRESSION:  The previously described right vestibular schwannoma has minimally  increased in size compared to 01/23/2018.    Electronically Signed by:  Kathrynn Humble, MD, [facility]  Electronically Signed on:  11/07/2022 12:04  PM   Results for orders placed or performed in visit on 11/08/22  Microalbumin, urine  Result Value Ref Range   Microalb, Ur 0.2   Microalbumin / creatinine urine ratio  Result Value Ref Range   Microalb Creat Ratio 3   Protein / creatinine ratio, urine  Result Value Ref Range   Creatinine, Urine 73   POCT glycosylated hemoglobin (Hb A1C)  Result Value Ref Range   Hemoglobin A1C 7.5 (A) 4.0 - 5.6 %    Urine Albumin / Creatinine Ratio Order: [id number] Component Ref Range & Units 2 mo ago  Creatinine, Ur 20 - 320 mg/dL 73  Urine Microalbumin See Note: mg/dL 0.2  Comment: Reference Range:                                  Reference Range                                 Not established  Microalb/Creat Ratio <30 mg/g creat 3  Comment:             The ADA defines abnormalities in albumin      excretion as follows:             Albuminuria Category        Result (mg/g creatinine)             Normal to Mildly increased   <30      Moderately increased         30-299      Severely increased           > OR = 300             The ADA recommends that at least two of three      specimens collected within a 3-6 month period be      abnormal before considering a patient to be      within a diagnostic category.  Resulting  Agency See order comments  Narrative Performed by [facility] COLLECTION REQUIREMENTS NOT MET. PATIENT ADVISED TO RETURN.  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 Continue current BP HALF tabs Telmisartan-HCTZ 80-25mg  (HALF tab) daily -Carvedilol 25mg  BID - Torsemide 40mg  daily  2. Encourage improved lifestyle - low sodium diet, regular exercise 3. Continue monitor BP outside office, bring readings to next visit, if persistently >140/90 or new symptoms notify office sooner      Relevant Medications   hydrochlorothiazide (HYDRODIURIL) 25 MG tablet   Type 2 diabetes mellitus with diabetic nephropathy (HCC) - Primary    Controlled DM with A1c 7.5 improved control on insulin regimen per Endocrinology Complications - CKD III, Hyperlipidemia, GERD, obesity, CAD -  increases risk of future cardiovascular complications   Complication HYPOGLYCEMIA with decline in renal function  Off Metformin due to CKD  Plan:  1. Discussion on Hypoglycemia again, he is using glucose tablets and has GVOKE, he is monitoring closely on CGM and followed by Endocrine  - Continue current therapy -  Tresiba, Lispro adjusting doses per Endocrinology  2. Encourage improved lifestyle - low carb, low sugar diet, reduce portion size, continue improving regular exercise 3. Check CBG , bring log to next visit for review 4. Continue ASA,  ARB, Statin  Use GVOKE emergency pen if glucose 54 or less or symptoms hypoglycemia. Instead of oral glucose intake.      Relevant Orders   POCT glycosylated hemoglobin (Hb A1C) (Completed)        Type 2 Diabetes Mellitus -All vaccinations up to date, including shingles. -Schedule physical exam in 6 months.        No orders of the defined types were placed in this encounter.    Follow up plan: Return in about 6 months (around 05/08/2023) for 6 month fasting lab only then 1 week later Annual Physical (any day 1st AM apt).  Future labs ordered for  05/08/23   Saralyn Pilar, DO [facility] 11/08/2022, 9:19 AM

## 2022-11-08 NOTE — Patient Instructions (Addendum)
Thank you for coming to the office today.  . Recent Labs    01/26/22 0818 04/27/22 1329 11/08/22 0924  HGBA1C 6.9* 7.3* 7.5*   Okay with A1c 7.5 range  Caution with lower sugars is the key issue.  Keep following with Dr Gershon Crane on the medications  Be cautious with Glyburide in evening to run risk of low sugars.  Use GVOKE pen for emergency.  ------------------------------------------  DUE for FASTING BLOOD WORK (no food or drink after midnight before the lab appointment, only water or coffee without cream/sugar on the morning of)  SCHEDULE "Lab Only" visit in the morning at the clinic for lab draw in 6  MONTHS   - Make sure Lab Only appointment is at about 1 week before your next appointment, so that results will be available  For Lab Results, once available within 2-3 days of blood draw, you can can log in to MyChart online to view your results and a brief explanation. Also, we can discuss results at next follow-up visit.   Please schedule a Follow-up Appointment to: Return in about 6 months (around 05/08/2023) for 6 month fasting lab only then 1 week later Annual Physical (any day 1st AM apt).  If you have any other questions or concerns, please feel free to call the office or send a message through MyChart. You may also schedule an earlier appointment if necessary.  Additionally, you may be receiving a survey about your experience at our office within a few days to 1 week by e-mail or mail. We value your feedback.  Saralyn Pilar, DO [facility] [place], [place]

## 2022-12-09 LAB — HM DIABETES EYE EXAM

## 2022-12-20 ENCOUNTER — Encounter: Payer: Self-pay | Admitting: Family Medicine

## 2023-01-17 ENCOUNTER — Ambulatory Visit: Payer: Federal, State, Local not specified - PPO | Admitting: Podiatry

## 2023-01-17 ENCOUNTER — Telehealth: Payer: Self-pay

## 2023-01-17 ENCOUNTER — Other Ambulatory Visit: Payer: Self-pay | Admitting: Dermatology

## 2023-01-17 ENCOUNTER — Encounter: Payer: Self-pay | Admitting: Podiatry

## 2023-01-17 VITALS — Ht 65.0 in | Wt 255.2 lb

## 2023-01-17 DIAGNOSIS — L219 Seborrheic dermatitis, unspecified: Secondary | ICD-10-CM

## 2023-01-17 DIAGNOSIS — M79675 Pain in left toe(s): Secondary | ICD-10-CM | POA: Diagnosis not present

## 2023-01-17 DIAGNOSIS — B351 Tinea unguium: Secondary | ICD-10-CM | POA: Diagnosis not present

## 2023-01-17 DIAGNOSIS — M79674 Pain in right toe(s): Secondary | ICD-10-CM

## 2023-01-17 DIAGNOSIS — E119 Type 2 diabetes mellitus without complications: Secondary | ICD-10-CM

## 2023-01-17 MED ORDER — KETOCONAZOLE 2 % EX CREA: TOPICAL_CREAM | CUTANEOUS | 6 refills | Status: AC

## 2023-01-17 NOTE — Telephone Encounter (Signed)
 Patient walked in the office requesting refill of Ketoconazole cream.   Ok Ketoconazole cream sent ot [facility] [place]

## 2023-01-17 NOTE — Progress Notes (Signed)
This patient returns to my office for at risk foot care.  This patient requires this care by a professional since this patient will be at risk due to having  diabetes.  This patient is unable to cut nails himself since the patient cannot reach his nails.These nails are painful walking and wearing shoes.  This patient presents for at risk foot care today.  General Appearance  Alert, conversant and in no acute stress.  Vascular  Dorsalis pedis and posterior tibial  pulses are palpable  bilaterally.  Capillary return is within normal limits  bilaterally. Temperature is within normal limits  bilaterally.  Neurologic  Senn-Weinstein monofilament wire test within normal limits  bilaterally. Muscle power within normal limits bilaterally.  Nails Thick disfigured discolored nails with subungual debris  hallux nails  bilaterally. No evidence of bacterial infection or drainage bilaterally.  Orthopedic  No limitations of motion  feet .  No crepitus or effusions noted.  No bony pathology or digital deformities noted.  Skin  normotropic skin with no porokeratosis noted bilaterally.  No signs of infections or ulcers noted.     Onychomycosis  Pain in right toes  Pain in left toes  Consent was obtained for treatment procedures.   Mechanical debridement of nails 1-5  bilaterally performed with a nail nipper.  Filed with dremel without incident.    Return office visit     3 months                Told patient to return for periodic foot care and evaluation due to potential at risk complications.   Nicholes Rough D.P.M.

## 2023-01-19 ENCOUNTER — Ambulatory Visit: Payer: Federal, State, Local not specified - PPO | Admitting: Podiatry

## 2023-03-07 ENCOUNTER — Other Ambulatory Visit: Payer: Self-pay

## 2023-03-07 DIAGNOSIS — E1169 Type 2 diabetes mellitus with other specified complication: Secondary | ICD-10-CM

## 2023-03-07 MED ORDER — ATORVASTATIN CALCIUM 20 MG PO TABS: 20.0000 mg | ORAL_TABLET | Freq: Every day | ORAL | 3 refills | Status: AC

## 2023-03-07 MED ORDER — TAMSULOSIN HCL 0.4 MG PO CAPS: 0.4000 mg | ORAL_CAPSULE | Freq: Every day | ORAL | 1 refills | Status: DC

## 2023-05-08 ENCOUNTER — Other Ambulatory Visit: Payer: Self-pay

## 2023-05-08 DIAGNOSIS — N183 Chronic kidney disease, stage 3 unspecified: Secondary | ICD-10-CM

## 2023-05-08 DIAGNOSIS — I25810 Atherosclerosis of coronary artery bypass graft(s) without angina pectoris: Secondary | ICD-10-CM

## 2023-05-08 DIAGNOSIS — Z0000 Encounter for general adult medical examination without abnormal findings: Secondary | ICD-10-CM

## 2023-05-08 DIAGNOSIS — M1A0790 Idiopathic chronic gout, unspecified ankle and foot, without tophus (tophi): Secondary | ICD-10-CM

## 2023-05-08 DIAGNOSIS — Z794 Long term (current) use of insulin: Secondary | ICD-10-CM

## 2023-05-08 DIAGNOSIS — N400 Enlarged prostate without lower urinary tract symptoms: Secondary | ICD-10-CM

## 2023-05-08 DIAGNOSIS — E1169 Type 2 diabetes mellitus with other specified complication: Secondary | ICD-10-CM

## 2023-05-09 LAB — LIPID PANEL
Cholesterol: 130 mg/dL (ref <200)
HDL: 33 mg/dL — ABNORMAL LOW (ref >=40)
LDL Cholesterol (Calc): 58 mg/dL
Non-HDL Cholesterol (Calc): 97 mg/dL (ref <130)
Total CHOL/HDL Ratio: 3.9 (calc) (ref <5.0)
Triglycerides: 372 mg/dL — ABNORMAL HIGH (ref <150)

## 2023-05-09 LAB — CBC WITH DIFFERENTIAL/PLATELET
Absolute Lymphocytes: 2963 {cells}/uL (ref 850–3900)
Absolute Monocytes: 695 {cells}/uL (ref 200–950)
Basophils Absolute: 32 {cells}/uL (ref 0–200)
Basophils Relative: 0.4 %
Eosinophils Absolute: 490 {cells}/uL (ref 15–500)
Eosinophils Relative: 6.2 %
HCT: 32.5 % — ABNORMAL LOW (ref 38.5–50.0)
Hemoglobin: 11 g/dL — ABNORMAL LOW (ref 13.2–17.1)
MCH: 33.8 pg — ABNORMAL HIGH (ref 27.0–33.0)
MCHC: 33.8 g/dL (ref 32.0–36.0)
MCV: 100 fL (ref 80.0–100.0)
MPV: 11.4 fL (ref 7.5–12.5)
Monocytes Relative: 8.8 %
Neutro Abs: 3721 {cells}/uL (ref 1500–7800)
Neutrophils Relative %: 47.1 %
Platelets: 195 10*3/uL (ref 140–400)
RBC: 3.25 10*6/uL — ABNORMAL LOW (ref 4.20–5.80)
RDW: 13.9 % (ref 11.0–15.0)
Total Lymphocyte: 37.5 %
WBC: 7.9 10*3/uL (ref 3.8–10.8)

## 2023-05-09 LAB — COMPLETE METABOLIC PANEL WITHOUT GFR
AG Ratio: 1.9 (calc) (ref 1.0–2.5)
ALT: 15 U/L (ref 9–46)
AST: 15 U/L (ref 10–35)
Albumin: 4.2 g/dL (ref 3.6–5.1)
Alkaline phosphatase (APISO): 74 U/L (ref 35–144)
BUN/Creatinine Ratio: 25 (calc) — ABNORMAL HIGH (ref 6–22)
BUN: 57 mg/dL — ABNORMAL HIGH (ref 7–25)
CO2: 30 mmol/L (ref 20–32)
Calcium: 9.3 mg/dL (ref 8.6–10.3)
Chloride: 101 mmol/L (ref 98–110)
Creat: 2.27 mg/dL — ABNORMAL HIGH (ref 0.70–1.28)
Globulin: 2.2 g/dL (ref 1.9–3.7)
Glucose, Bld: 87 mg/dL (ref 65–99)
Potassium: 4 mmol/L (ref 3.5–5.3)
Sodium: 140 mmol/L (ref 135–146)
Total Bilirubin: 0.4 mg/dL (ref 0.2–1.2)
Total Protein: 6.4 g/dL (ref 6.1–8.1)

## 2023-05-09 LAB — HEMOGLOBIN A1C
Hgb A1c MFr Bld: 7.7 % — ABNORMAL HIGH (ref <5.7)
Mean Plasma Glucose: 174 mg/dL
eAG (mmol/L): 9.7 mmol/L

## 2023-05-09 LAB — URIC ACID: Uric Acid, Serum: 7.7 mg/dL (ref 4.0–8.0)

## 2023-05-09 LAB — PSA: PSA: 0.94 ng/mL (ref <=4.00)

## 2023-05-09 LAB — TSH: TSH: 0.85 m[IU]/L (ref 0.40–4.50)

## 2023-05-15 ENCOUNTER — Ambulatory Visit (INDEPENDENT_AMBULATORY_CARE_PROVIDER_SITE_OTHER): Payer: Self-pay | Admitting: Family Medicine

## 2023-05-15 ENCOUNTER — Encounter: Payer: Self-pay | Admitting: Family Medicine

## 2023-05-15 VITALS — BP 124/58 | HR 85 | Ht 65.0 in | Wt 258.4 lb

## 2023-05-15 DIAGNOSIS — E785 Hyperlipidemia, unspecified: Secondary | ICD-10-CM

## 2023-05-15 DIAGNOSIS — D333 Benign neoplasm of cranial nerves: Secondary | ICD-10-CM

## 2023-05-15 DIAGNOSIS — I25810 Atherosclerosis of coronary artery bypass graft(s) without angina pectoris: Secondary | ICD-10-CM | POA: Diagnosis not present

## 2023-05-15 DIAGNOSIS — I129 Hypertensive chronic kidney disease with stage 1 through stage 4 chronic kidney disease, or unspecified chronic kidney disease: Secondary | ICD-10-CM

## 2023-05-15 DIAGNOSIS — Z0000 Encounter for general adult medical examination without abnormal findings: Secondary | ICD-10-CM

## 2023-05-15 DIAGNOSIS — E1121 Type 2 diabetes mellitus with diabetic nephropathy: Secondary | ICD-10-CM

## 2023-05-15 DIAGNOSIS — Z794 Long term (current) use of insulin: Secondary | ICD-10-CM

## 2023-05-15 DIAGNOSIS — E1169 Type 2 diabetes mellitus with other specified complication: Secondary | ICD-10-CM

## 2023-05-15 DIAGNOSIS — N183 Hypertensive chronic kidney disease with stage 1 through stage 4 chronic kidney disease, or unspecified chronic kidney disease: Secondary | ICD-10-CM

## 2023-05-15 NOTE — Progress Notes (Signed)
 Subjective:    Patient ID: [name], male    DOB: 1945/07/06, 78 y.o.   MRN: [id number]  [name] is a 78 y.o. male presenting on 05/15/2023 for Annual Exam and Diabetes   HPI  Discussed the use of AI scribe software for clinical note transcription with the patient, who gave verbal consent to proceed.  History of Present Illness   [name] is a 78 year old male who presents for an annual physical exam.  He is experiencing issues with his insurance and medication coverage, particularly with Medicare Part D, leading to prescription discrepancies and frustration.  He experiences lightheadedness and dizziness when bending over, which resulted in a fall into a trash can about three weeks ago, causing residual bruising on his arm. He attributes these symptoms to his acoustic neuroma, previously treated with radiation in 2015, which has shown growth again and affects his balance. Has upcoming MRI arranged.  He has a history of gout and has not been taking allopurinol  regularly, which he associates with increased swelling in his feet, particularly at night. He also experiences swelling in his legs and feet, which he manages by elevating them, though it does not significantly alleviate the swelling.  He inquires about his elevated parathyroid hormone (PTH) levels and expresses concern about the implications. Additionally, he reports a new issue with his right middle finger, which has been locking and causing discomfort, described as 'trigger finger'.     CHRONIC DM, Type 2: Chronic problem.  His diabetes management includes checking blood sugar multiple times a day and adjusting his insulin  regimen by reducing insulin  at night to prevent low blood sugars and increasing it in the morning. His recent A1c was 7.7, up from 7.5, despite blood sugars averaging between 80 and 100. Followed by [facility] Endocrinology Dr Shelvy Dickens Using CGM Managed on Basal insulin  Tresiba and Humalog  mealtime, also Glyburide  per Endocrine  - Currently on Tresiba 66-70 units, Humalog 21 units with breakfast, and 10-12u with PM meal - On Glyburide  5mg  daily with meal OFF Metformin  Jardiance He does admit to overnight hypoglycemia, resolved with oral glucose intake, he has GVOKE pen as emergency if needed. Reports  good compliance. Tolerating well w/o side-effects Currently on ARB [facility]) Admits occasional hypoglycemia Denies polyuria, visual changes, numbness or tingling   Osteoarthritis Additional update now he takes Tylenol x 2 = 500mg  regularly and it helps manage arthritis pains.   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 Asks about swap back to Furosemide , has swelling    Health Maintenance: Future Prevnar-20     05/15/2023    8:05 AM 11/08/2022    9:12 AM 05/05/2022    8:38 AM  Depression screen PHQ 2/9  Decreased Interest 0 0 0  Down, Depressed, Hopeless 0 0 0  PHQ - 2 Score 0 0 0  Altered sleeping 1 0   Tired, decreased energy 0 0   Change in appetite 0 0   Feeling bad or failure about yourself  0 0   Trouble concentrating 0 0   Moving slowly or fidgety/restless 0 0   Suicidal thoughts  0   PHQ-9 Score 1 0   Difficult doing work/chores Not  difficult at all Not difficult at all        05/15/2023    8:05 AM 11/08/2022    9:13 AM 05/05/2022    8:38 AM 01/26/2022    8:22 AM  GAD 7 : Generalized Anxiety Score  Nervous, Anxious, on Edge 0 0 0 0  Control/stop worrying 0 0 0 0  Worry too much - different things 0 0 0 0  Trouble relaxing 0 0 0 0  Restless 0 0 0 0  Easily annoyed or irritable 0 0 0 0  Afraid - awful might happen 0 0 0 0  Total GAD 7 Score 0 0 0 0  Anxiety Difficulty     Not difficult at all     Past Medical History:  Diagnosis Date   Actinic keratosis    AN (acoustic neuroma) (HCC) 2012   by Jilda Most   CAD (coronary artery disease)    multivessel diffuse disease   Chronic kidney disease    3   Diabetes mellitus without complication (HCC)    Gout    Hearing loss in right ear    Hearing loss in right ear    due to H/O acoustic neuroma   History of pancreatitis 1996   idiopathic   HLD (hyperlipidemia)    Hoarseness    HTN (hypertension)    PONV (postoperative nausea and vomiting)    after CTS   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   Vertigo    due to H/O right acoustic neuroma   Past Surgical History:  Procedure Laterality Date   BACK SURGERY     BASAL CELL CARCINOMA EXCISION  2013   removed from right side of face   CARPAL TUNNEL RELEASE     CATARACT EXTRACTION W/PHACO Left 04/26/2022   Procedure: CATARACT EXTRACTION PHACO AND INTRAOCULAR LENS PLACEMENT (IOC) LEFT DIABETIC  13.21  01:17.4;  Surgeon: Clair Crews, MD;  Location: [facility];  Service: Ophthalmology;  Laterality: Left;  Diabetic   CATARACT EXTRACTION W/PHACO Right 05/10/2022   Procedure: CATARACT EXTRACTION PHACO AND INTRAOCULAR LENS PLACEMENT (IOC) RIGHT DIABETIC;  Surgeon: Clair Crews, MD;  Location: [facility];  Service: Ophthalmology;  Laterality: Right;  Diabetic  7.39  00:45.1   CORONARY ANGIOPLASTY WITH STENT PLACEMENT     CORONARY ARTERY BYPASS GRAFT  NOV 2007   x5   cyst removal of right hand     ruptured disk surgery     neck    Social History   Socioeconomic History   Marital status: Married    Spouse name: Not on file   Number of children: Not on file   Years of education: Not on file   Highest education level: Associate degree: occupational, Scientist, product/process development, or vocational program  Occupational History   Not on file  Tobacco Use   Smoking status: Never   Smokeless tobacco: Never   Tobacco comments:     tobacco use- no   Vaping Use   Vaping status: Never Used  Substance and Sexual Activity   Alcohol use: Never   Drug use: No   Sexual activity: Not on file  Other Topics Concern   Not on file  Social History Narrative   Does not regularly exercise. Works part time at [employer] 2-3 hours at night.    Social Drivers of Corporate investment banker Strain: Low Risk  (11/06/2022)   Overall Financial Resource Strain (CARDIA)    Difficulty of Paying Living  Expenses: Not hard at all  Food Insecurity: No Food Insecurity (11/06/2022)   Hunger Vital Sign    Worried About Running Out of Food in the Last Year: Never true    Ran Out of Food in the Last Year: Never true  Transportation Needs: No Transportation Needs (11/06/2022)   PRAPARE - Administrator, Civil Service (Medical): No    Lack of Transportation (Non-Medical): No  Physical Activity: Unknown (11/06/2022)   Exercise Vital Sign    Days of Exercise per Week: 0 days    Minutes of Exercise per Session: Not on file  Stress: No Stress Concern Present (11/06/2022)   [employer] of Occupational Health - Occupational Stress Questionnaire    Feeling of Stress : Not at all  Social Connections: Socially Isolated (11/06/2022)   Social Connection and Isolation Panel [NHANES]    Frequency of Communication with Friends and Family: Once a week    Frequency of Social Gatherings with Friends and Family: Never    Attends Religious Services: Never    Database administrator or Organizations: No    Attends Engineer, structural: Not on file    Marital Status: Married  Catering manager Violence: Not on file   Family History  Problem Relation Age of Onset   Heart disease Mother    Hyperlipidemia Mother    Hypertension Mother    Heart failure Mother    Diabetes Father    Heart attack Father    Colon cancer Sister 67   Diabetes Sister    Diabetes Brother    Coronary artery disease Brother    Crohn's disease Brother     Melanoma Brother    Diabetes Brother    Coronary artery disease Other    Diabetes Other    Cancer Other    Current Outpatient Medications on File Prior to Visit  Medication Sig   acetaminophen (TYLENOL) 500 MG tablet Take 500 mg by mouth every 6 (six) hours as needed.   allopurinol  (ZYLOPRIM ) 100 MG tablet Take 1 tablet (100 mg total) by mouth daily.   aspirin 325 MG tablet Take 325 mg by mouth daily.   atorvastatin  (LIPITOR) 20 MG tablet Take 1 tablet (20 mg total) by mouth at bedtime.   carvedilol  (COREG ) 25 MG tablet TAKE 1 TABLET TWICE DAILY  WITH MEALS   Cholecalciferol 125 MCG (5000 UT) TABS Take 5,000 Units by mouth daily.   colchicine 0.6 MG tablet Take 0.6 mg by mouth daily.   Continuous Blood Gluc Receiver (FREESTYLE LIBRE 2 READER) DEVI Use to monitor blood glucose.   Continuous Blood Gluc Sensor (FREESTYLE LIBRE 2 SENSOR) MISC    cyanocobalamin (VITAMIN B12) 1000 MCG tablet Take 3,000 mcg by mouth daily.   esomeprazole  (NEXIUM ) 20 MG capsule Take 1 capsule (20 mg total) by mouth daily before breakfast.   glyBURIDE  (DIABETA ) 5 MG tablet TAKE 2 TABLETS TWICE A DAY WITH MEALS   GVOKE HYPOPEN  2-PACK 1 MG/0.2ML SOAJ Inject 1 mg into the skin as needed (hypoglycemia).   hydrochlorothiazide (HYDRODIURIL) 25 MG tablet Take 25 mg by mouth daily as needed.   hydrocortisone  valerate cream (WESTCORT ) 0.2 % Apply 1 application topically 2 (two) times daily.   insulin  degludec (TRESIBA) 200 UNIT/ML FlexTouch Pen Inject 70 Units into the skin daily.   insulin  lispro (HUMALOG) 100 UNIT/ML KwikPen 21 Units 3 (three) times daily. + sliding scale   Insulin  Pen Needle (BD PEN NEEDLE NANO 2ND GEN) 32G X 4 MM  MISC 4 shots per day   Iodoquinol-Hydrocortisone -Aloe 1-1.9 % CREA Apply 1 Application topically every other day.   ketoconazole  (NIZORAL ) 2 % cream APPLY TO THE FACE AT BEDTIME MONDAY, WEDNESDAY, FRIDAY   Lancets (ACCU-CHEK SOFT TOUCH) lancets Use as instructed   Magnesium  250 MG TABS Take 1  tablet by mouth daily.   Multiple Vitamin (MULTI-VITAMIN) tablet Take 1 tablet by mouth daily.   tamsulosin  (FLOMAX ) 0.4 MG CAPS capsule Take 1 capsule (0.4 mg total) by mouth daily.   telmisartan (MICARDIS) 40 MG tablet Take 40 mg by mouth daily.   torsemide (DEMADEX) 20 MG tablet Take 40 mg by mouth daily.   No current facility-administered medications on file prior to visit.    Review of Systems  Constitutional:  Negative for activity change, appetite change, chills, diaphoresis, fatigue and fever.  HENT:  Positive for hearing loss. Negative for congestion.   Eyes:  Negative for visual disturbance.  Respiratory:  Negative for cough, chest tightness, shortness of breath and wheezing.   Cardiovascular:  Negative for chest pain, palpitations and leg swelling.  Gastrointestinal:  Negative for abdominal pain, constipation, diarrhea, nausea and vomiting.  Genitourinary:  Negative for dysuria, frequency and hematuria.  Musculoskeletal:  Negative for arthralgias and neck pain.  Skin:  Negative for rash.  Neurological:  Negative for dizziness, weakness, light-headedness, numbness and headaches.  Hematological:  Negative for adenopathy.  Psychiatric/Behavioral:  Negative for behavioral problems, dysphoric mood and sleep disturbance.    Per HPI unless specifically indicated above     Objective:     BP (!) 124/58 (BP Location: Left Arm, Patient Position: Sitting, Cuff Size: Large)   Pulse 85   Ht 5\' 5"  (1.651 m)   Wt 258 lb 6 oz (117.2 kg)   SpO2 98%   BMI 43.00 kg/m   Wt Readings from Last 3 Encounters:  05/15/23 258 lb 6 oz (117.2 kg)  01/17/23 255 lb 3.2 oz (115.8 kg)  11/08/22 255 lb 3.2 oz (115.8 kg)    Physical Exam Vitals and nursing note reviewed.  Constitutional:      General: He is not in acute distress.    Appearance: He is well-developed. He is not diaphoretic.     Comments: Well-appearing, comfortable, cooperative  HENT:     Head: Normocephalic and atraumatic.   Eyes:     General:        Right eye: No discharge.        Left eye: No discharge.     Conjunctiva/sclera: Conjunctivae normal.     Pupils: Pupils are equal, round, and reactive to light.  Neck:     Thyroid: No thyromegaly.  Cardiovascular:     Rate and Rhythm: Normal rate and regular rhythm.     Pulses: Normal pulses.     Heart sounds: Normal heart sounds. No murmur heard. Pulmonary:     Effort: Pulmonary effort is normal. No respiratory distress.     Breath sounds: Normal breath sounds. No wheezing or rales.  Abdominal:     General: Bowel sounds are normal. There is no distension.     Palpations: Abdomen is soft. There is no mass.     Tenderness: There is no abdominal tenderness.  Musculoskeletal:        General: No tenderness. Normal range of motion.     Cervical back: Normal range of motion and neck supple.     Comments: Upper / Lower Extremities: - Normal muscle tone, strength bilateral upper extremities 5/5, lower extremities  5/5  Right middle finger triggering on exam  Lymphadenopathy:     Cervical: No cervical adenopathy.  Skin:    General: Skin is warm and dry.     Findings: No erythema or rash.  Neurological:     Mental Status: He is alert and oriented to person, place, and time.     Comments: Distal sensation intact to light touch all extremities  Psychiatric:        Mood and Affect: Mood normal.        Behavior: Behavior normal.        Thought Content: Thought content normal.     Comments: Well groomed, good eye contact, normal speech and thoughts      Diabetic Foot Exam - Simple   Simple Foot Form Diabetic Foot exam was performed with the following findings: Yes 05/15/2023  8:32 AM  Visual Inspection See comments: Yes Sensation Testing Intact to touch and monofilament testing bilaterally: Yes Pulse Check Posterior Tibialis and Dorsalis pulse intact bilaterally: Yes Comments Callus formation, no ulceration. Intact monofilament.      Results for  orders placed or performed in visit on 05/08/23  TSH   Collection Time: 05/08/23  7:56 AM  Result Value Ref Range   TSH 0.85 0.40 - 4.50 mIU/L  Uric acid   Collection Time: 05/08/23  7:56 AM  Result Value Ref Range   Uric Acid, Serum 7.7 4.0 - 8.0 mg/dL  PSA   Collection Time: 05/08/23  7:56 AM  Result Value Ref Range   PSA 0.94 < OR = 4.00 ng/mL  CBC with Differential/Platelet   Collection Time: 05/08/23  7:56 AM  Result Value Ref Range   WBC 7.9 3.8 - 10.8 Thousand/uL   RBC 3.25 (L) 4.20 - 5.80 Million/uL   Hemoglobin 11.0 (L) 13.2 - 17.1 g/dL   HCT 36.6 (L) 44.0 - 34.7 %   MCV 100.0 80.0 - 100.0 fL   MCH 33.8 (H) 27.0 - 33.0 pg   MCHC 33.8 32.0 - 36.0 g/dL   RDW 42.5 95.6 - 38.7 %   Platelets 195 140 - 400 Thousand/uL   MPV 11.4 7.5 - 12.5 fL   Neutro Abs 3,721 1,500 - 7,800 cells/uL   Absolute Lymphocytes 2,963 850 - 3,900 cells/uL   Absolute Monocytes 695 200 - 950 cells/uL   Eosinophils Absolute 490 15 - 500 cells/uL   Basophils Absolute 32 0 - 200 cells/uL   Neutrophils Relative % 47.1 %   Total Lymphocyte 37.5 %   Monocytes Relative 8.8 %   Eosinophils Relative 6.2 %   Basophils Relative 0.4 %  COMPLETE METABOLIC PANEL WITH GFR   Collection Time: 05/08/23  7:56 AM  Result Value Ref Range   Glucose, Bld 87 65 - 99 mg/dL   BUN 57 (H) 7 - 25 mg/dL   Creat 5.64 (H) 3.32 - 1.28 mg/dL   BUN/Creatinine Ratio 25 (H) 6 - 22 (calc)   Sodium 140 135 - 146 mmol/L   Potassium 4.0 3.5 - 5.3 mmol/L   Chloride 101 98 - 110 mmol/L   CO2 30 20 - 32 mmol/L   Calcium  9.3 8.6 - 10.3 mg/dL   Total Protein 6.4 6.1 - 8.1 g/dL   Albumin 4.2 3.6 - 5.1 g/dL   Globulin 2.2 1.9 - 3.7 g/dL (calc)   AG Ratio 1.9 1.0 - 2.5 (calc)   Total Bilirubin 0.4 0.2 - 1.2 mg/dL   Alkaline phosphatase (APISO) 74 35 - 144 U/L   AST  15 10 - 35 U/L   ALT 15 9 - 46 U/L  Lipid panel   Collection Time: 05/08/23  7:56 AM  Result Value Ref Range   Cholesterol 130 <200 mg/dL   HDL 33 (L) > OR = 40  mg/dL   Triglycerides 469 (H) <150 mg/dL   LDL Cholesterol (Calc) 58 mg/dL (calc)   Total CHOL/HDL Ratio 3.9 <5.0 (calc)   Non-HDL Cholesterol (Calc) 97 <629 mg/dL (calc)  Hemoglobin B2W   Collection Time: 05/08/23  7:56 AM  Result Value Ref Range   Hgb A1c MFr Bld 7.7 (H) <5.7 %   Mean Plasma Glucose 174 mg/dL   eAG (mmol/L) 9.7 mmol/L      Assessment & Plan:   Problem List Items Addressed This Visit     Acoustic neuroma (HCC)   Benign hypertension with CKD (chronic kidney disease) stage III (HCC)   Relevant Medications   telmisartan (MICARDIS) 40 MG tablet   CAD, ARTERY BYPASS GRAFT   Relevant Medications   telmisartan (MICARDIS) 40 MG tablet   Hyperlipidemia associated with type 2 diabetes mellitus (HCC)   Relevant Medications   telmisartan (MICARDIS) 40 MG tablet   Morbid obesity (HCC)   Type 2 diabetes mellitus with diabetic nephropathy (HCC)   Relevant Medications   telmisartan (MICARDIS) 40 MG tablet   Other Visit Diagnoses       Annual physical exam    -  Primary        Updated Health Maintenance information Reviewed recent lab results with patient Encouraged improvement to lifestyle with diet and exercise Goal of weight loss   Acoustic neuroma R ear Hearing Loss Recent MRI showed growth, causing balance issues and dizziness. Under surveillance for tumor size changes. Potential future radiation if growth continues. - Continue regular follow-up with neurology. - Future  MRI as recommended by specialist.  Wellness Visit Annual visit conducted. Discussed insurance changes, prescription coverage, blood work, and vaccination status. Dizziness likely due to acoustic neuroma. Recent fall with bruising. Reviewed medication and blood pressure management. - Review blood work results. - Discuss vaccination status and options.  Type 2 diabetes mellitus with hyperglycemia A1c increased to 7.7. Home glucose readings lower than A1c. Adjusted insulin  to prevent  nocturnal hypoglycemia. Discussed insulin  limitations and hypoglycemia avoidance. Managing well without frequent hypoglycemia. Followed by Endocrinology for management. Continue current insulin  regimen. - Caution Hypoglycemia. Has GVOKE pen - Monitor blood glucose levels regularly. - Review A1c in six months. - Consider consultation with pharmacist for insulin  management if needed.  Chronic kidney disease, 3b Followed by Nephrology Elevated PTH levels discussed. No immediate intervention required. - Continue monitoring kidney function. - Discuss PTH levels with nephrologist if needed.  Swelling of lower extremities Swelling in legs and feet, particularly at night. Elevation and compression stockings not significantly effective. Possible relation to kidney function or medication side effects. - Continue using compression stockings. - Elevate legs in the evening. - Monitor for changes in swelling.  Gout Not on allopurinol . Occasional foot swelling at night. Discussed allopurinol 's role in preventing flares. Uric acid levels are good. - Consider resuming allopurinol  if gout symptoms worsen. Or add colchicine again PRN - Monitor for signs of gout flares.  Trigger finger, right middle finger Right middle finger locking and difficulty bending. Symptoms worsened recently. Prefers to avoid intervention. - Monitor symptoms of trigger finger. - Consider referral to orthopedics if symptoms interfere with daily activities.         No orders of the defined  types were placed in this encounter.   No orders of the defined types were placed in this encounter.    Follow up plan: Return in about 6 months (around 11/15/2023) for 6 month DM A1c.  Domingo Friend, DO [facility] 05/15/2023, 8:15 AM

## 2023-05-15 NOTE — Patient Instructions (Addendum)
 Thank you for coming to the office today.  Future Prevnar-20 - check with [facility]. We can also give the Pneumonia vaccine in future  Recent Labs    11/08/22 0924 05/08/23 0756  HGBA1C 7.5* 7.7*   I believe your average sugar is a bit lower than this as you stated, but as we discussed the insulin  will make it difficult to manage  Do not worry about the PTH, it is kidney related and no other treatment required  Cholesterol looks good.  Please schedule a Follow-up Appointment to: Return in about 6 months (around 11/15/2023) for 6 month DM A1c.  If you have any other questions or concerns, please feel free to call the office or send a message through MyChart. You may also schedule an earlier appointment if necessary.  Additionally, you may be receiving a survey about your experience at our office within a few days to 1 week by e-mail or mail. We value your feedback.  Domingo Friend, DO [facility], [place]

## 2023-05-16 ENCOUNTER — Ambulatory Visit: Payer: Federal, State, Local not specified - PPO | Admitting: Podiatry

## 2023-05-16 DIAGNOSIS — M79675 Pain in left toe(s): Secondary | ICD-10-CM

## 2023-05-16 DIAGNOSIS — M79674 Pain in right toe(s): Secondary | ICD-10-CM

## 2023-05-16 DIAGNOSIS — E119 Type 2 diabetes mellitus without complications: Secondary | ICD-10-CM | POA: Diagnosis not present

## 2023-05-16 DIAGNOSIS — B351 Tinea unguium: Secondary | ICD-10-CM | POA: Diagnosis not present

## 2023-05-16 NOTE — Progress Notes (Signed)
This patient returns to my office for at risk foot care.  This patient requires this care by a professional since this patient will be at risk due to having  diabetes.  This patient is unable to cut nails himself since the patient cannot reach his nails.These nails are painful walking and wearing shoes.  This patient presents for at risk foot care today.  General Appearance  Alert, conversant and in no acute stress.  Vascular  Dorsalis pedis and posterior tibial  pulses are palpable  bilaterally.  Capillary return is within normal limits  bilaterally. Temperature is within normal limits  bilaterally.  Neurologic  Senn-Weinstein monofilament wire test within normal limits  bilaterally. Muscle power within normal limits bilaterally.  Nails Thick disfigured discolored nails with subungual debris  hallux nails  bilaterally. No evidence of bacterial infection or drainage bilaterally.  Orthopedic  No limitations of motion  feet .  No crepitus or effusions noted.  No bony pathology or digital deformities noted.  Skin  normotropic skin with no porokeratosis noted bilaterally.  No signs of infections or ulcers noted.     Onychomycosis  Pain in right toes  Pain in left toes  Consent was obtained for treatment procedures.   Mechanical debridement of nails 1-5  bilaterally performed with a nail nipper.  Filed with dremel without incident.    Return office visit     3 months                Told patient to return for periodic foot care and evaluation due to potential at risk complications.   Nicholes Rough D.P.M.

## 2023-06-06 ENCOUNTER — Telehealth: Payer: Self-pay | Admitting: Family Medicine

## 2023-06-06 DIAGNOSIS — M1A0790 Idiopathic chronic gout, unspecified ankle and foot, without tophus (tophi): Secondary | ICD-10-CM

## 2023-06-06 MED ORDER — COLCHICINE 0.6 MG PO TABS: ORAL_TABLET | ORAL | 5 refills | Status: AC

## 2023-06-06 NOTE — Telephone Encounter (Signed)
 Can you contact patient to clarify his current dosing regimen? This Colchicine should be as needed for gout flares, typical dosing is as follows:  Take 2 tablets (1.2mg ) by mouth at first sign of gout flare followed by 1 tablet (0.6mg ) after 1 hour. (Max 1.8mg  within 1 hour)   The chart says 0.6mg  once per day but that it is historical. Some patients take a low dose every day for prevention but it is usually not how I prescribe it.  Can you clarify how he takes it?  Also the refill request has 2 pharmacies listed it [facility] and [facility] [place]. I am not sure what he is requesting and quantity.  Because it is used as needed usually we often may not send to Mail Order because he likely does not need 90 day supply.  Let me know. Thank you!  Domingo Friend, DO [facility] 06/06/2023, 11:40 AM

## 2023-06-06 NOTE — Telephone Encounter (Signed)
 Prescription Request  06/06/2023  LOV: 05/15/2023  What is the name of the medication or equipment ?  Colchicine    Have you contacted your pharmacy to request a refill? No   Which pharmacy would you like this sent to?  [facility] - [place], [place] - [address] [facility] [address] Portal to Registered Caremark Sites [address] [place] [place] [address] Phone: [phone] Fax: [phone]  [id number] [place], [place] - [address] [place] [address] Phone: [phone] Fax: [phone]    Patient notified that their request is being sent to the clinical staff for review and that they should receive a response within 2 business days.   Please advise at Mobile There is no such number on file (mobile).

## 2023-06-06 NOTE — Addendum Note (Signed)
 Addended by: Raina Bunting on: 06/06/2023 11:52 AM   Modules accepted: Orders

## 2023-06-21 ENCOUNTER — Ambulatory Visit: Payer: Federal, State, Local not specified - PPO | Admitting: Dermatology

## 2023-07-04 ENCOUNTER — Encounter: Payer: Self-pay | Admitting: Dermatology

## 2023-07-04 ENCOUNTER — Ambulatory Visit: Admitting: Dermatology

## 2023-07-04 DIAGNOSIS — D692 Other nonthrombocytopenic purpura: Secondary | ICD-10-CM

## 2023-07-04 DIAGNOSIS — L304 Erythema intertrigo: Secondary | ICD-10-CM

## 2023-07-04 DIAGNOSIS — D229 Melanocytic nevi, unspecified: Secondary | ICD-10-CM

## 2023-07-04 DIAGNOSIS — L820 Inflamed seborrheic keratosis: Secondary | ICD-10-CM | POA: Diagnosis not present

## 2023-07-04 DIAGNOSIS — W908XXA Exposure to other nonionizing radiation, initial encounter: Secondary | ICD-10-CM

## 2023-07-04 DIAGNOSIS — L918 Other hypertrophic disorders of the skin: Secondary | ICD-10-CM

## 2023-07-04 DIAGNOSIS — D1801 Hemangioma of skin and subcutaneous tissue: Secondary | ICD-10-CM

## 2023-07-04 DIAGNOSIS — Z79899 Other long term (current) drug therapy: Secondary | ICD-10-CM

## 2023-07-04 DIAGNOSIS — Z1283 Encounter for screening for malignant neoplasm of skin: Secondary | ICD-10-CM

## 2023-07-04 DIAGNOSIS — L570 Actinic keratosis: Secondary | ICD-10-CM

## 2023-07-04 DIAGNOSIS — L814 Other melanin hyperpigmentation: Secondary | ICD-10-CM | POA: Diagnosis not present

## 2023-07-04 DIAGNOSIS — Z85828 Personal history of other malignant neoplasm of skin: Secondary | ICD-10-CM

## 2023-07-04 DIAGNOSIS — L821 Other seborrheic keratosis: Secondary | ICD-10-CM

## 2023-07-04 DIAGNOSIS — L578 Other skin changes due to chronic exposure to nonionizing radiation: Secondary | ICD-10-CM

## 2023-07-04 DIAGNOSIS — Z7189 Other specified counseling: Secondary | ICD-10-CM

## 2023-07-04 NOTE — Progress Notes (Signed)
 Follow-Up Visit   Subjective  [name] is a 78 y.o. male who presents for the following: Skin Cancer Screening and Full Body Skin Exam Hx of bcc, hx of intertrigo  Spot at right forehead has used 2 creams but not helping  The patient presents for Total-Body Skin Exam (TBSE) for skin cancer screening and mole check. The patient has spots, moles and lesions to be evaluated, some may be new or changing and the patient may have concern these could be cancer.  The following portions of the chart were reviewed this encounter and updated as appropriate: medications, allergies, medical history  Review of Systems:  No other skin or systemic complaints except as noted in HPI or Assessment and Plan.  Objective  Well appearing patient in no apparent distress; mood and affect are within normal limits.  A full examination was performed including scalp, head, eyes, ears, nose, lips, neck, chest, axillae, abdomen, back, buttocks, bilateral upper extremities, bilateral lower extremities, hands, feet, fingers, toes, fingernails, and toenails. All findings within normal limits unless otherwise noted below.   Relevant physical exam findings are noted in the Assessment and Plan.  forehead x 7 (7) Erythematous thin papules/macules with gritty scale.  Scalp x 3, back x 1 (4) Erythematous stuck-on, waxy papule or plaque  Assessment & Plan   SKIN CANCER SCREENING PERFORMED TODAY.  ACTINIC DAMAGE - Chronic condition, secondary to cumulative UV/sun exposure - diffuse scaly erythematous macules with underlying dyspigmentation - Recommend daily broad spectrum sunscreen SPF 30+ to sun-exposed areas, reapply every 2 hours as needed.  - Staying in the shade or wearing long sleeves, sun glasses (UVA+UVB protection) and wide brim hats (4-inch brim around the entire circumference of the hat) are also recommended for sun protection.  - Call for new or changing lesions.  LENTIGINES, SEBORRHEIC KERATOSES,  HEMANGIOMAS - Benign normal skin lesions - Benign-appearing - Call for any changes  MELANOCYTIC NEVI - Tan-brown and/or pink-flesh-colored symmetric macules and papules - Benign appearing on exam today - Observation - Call clinic for new or changing moles - Recommend daily use of broad spectrum spf 30+ sunscreen to sun-exposed areas.   Acrochordons (Skin Tags) - Fleshy, skin-colored pedunculated papules - Benign appearing.  - Observe. - If desired, they can be removed with an in office procedure that is not covered by insurance. - Please call the clinic if you notice any new or changing lesions.    Purpura - Chronic; persistent and recurrent.  Treatable, but not curable. - Violaceous macules and patches - Benign - Related to trauma, age, sun damage and/or use of blood thinners, chronic use of topical and/or oral steroids - Observe - Can use OTC arnica containing moisturizer such as Dermend Bruise Formula if desired - Call for worsening or other concerns  INTERTRIGO - groin area Chronic and persistent condition with duration or expected duration over one year. Condition is improving with treatment but not currently at goal. Intertrigo is a chronic recurrent rash that occurs in skin fold areas that may be associated with friction; heat; moisture; yeast; fungus; and bacteria.  It is exacerbated by increased movement / activity; sweating; and higher atmospheric temperature. Treatment Plan Cont skin medicinals intertrigo cream Iodoquinol: 1% Hydrocortisone : 2.5% Niacinamide: 2% Vehicle: Cream  HISTORY OF BASAL CELL CARCINOMA OF THE SKIN - No evidence of recurrence today - Recommend regular full body skin exams - Recommend daily broad spectrum sunscreen SPF 30+ to sun-exposed areas, reapply every 2 hours as needed.  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 They are chronic with expected duration over 1 year. A portion of actinic keratoses will progress to squamous cell carcinoma of the skin. It is not possible to reliably predict which spots will progress to skin cancer and so treatment is recommended to prevent development of skin cancer.  Recommend daily broad spectrum sunscreen SPF 30+ to sun-exposed areas, reapply every 2 hours as needed.  Recommend staying in the shade or wearing long sleeves, sun glasses (UVA+UVB protection) and wide brim hats (4-inch brim around the entire circumference of the hat). Call for new or changing lesions. Destruction of lesion - forehead x 7 (7) Complexity: simple   Destruction method: cryotherapy   Informed consent: discussed and consent obtained   Timeout:  patient name, date of birth, surgical site, and procedure verified Lesion destroyed using liquid nitrogen: Yes   Region frozen until ice ball extended beyond lesion: Yes   Outcome: patient tolerated procedure well with no complications   Post-procedure details: wound care instructions given   INFLAMED SEBORRHEIC KERATOSIS (4) Scalp x 3, back x 1 (4) Symptomatic, irritating, patient would like treated. Destruction of lesion - Scalp x 3, back x 1 (4) Complexity: simple   Destruction method: cryotherapy   Informed consent: discussed and consent obtained   Timeout:  patient name, date of birth, surgical site, and procedure verified Lesion destroyed using liquid nitrogen: Yes   Region frozen until ice ball extended beyond lesion: Yes   Outcome: patient tolerated procedure well with no complications   Post-procedure details: wound care instructions given   Return in about 1 year (around 07/03/2024) for TBSE.  IEleanor Blush, CMA, am acting as scribe for Alm Rhyme, MD.   Documentation: I have reviewed the above documentation for accuracy and completeness, and I agree with the  above.  Alm Rhyme, MD

## 2023-07-04 NOTE — Patient Instructions (Addendum)

## 2023-08-23 ENCOUNTER — Other Ambulatory Visit: Payer: Self-pay | Admitting: Family Medicine

## 2023-08-23 DIAGNOSIS — M1A0790 Idiopathic chronic gout, unspecified ankle and foot, without tophus (tophi): Secondary | ICD-10-CM

## 2023-08-24 NOTE — Telephone Encounter (Signed)
 Requested Prescriptions  Pending Prescriptions Disp Refills   allopurinol  (ZYLOPRIM ) 100 MG tablet [Pharmacy Med Name: ALLOPURINOL   TAB 100MG ] 90 tablet 0    Sig: TAKE 1 TABLET DAILY     Endocrinology:  Gout Agents - allopurinol  Failed - 08/24/2023  2:27 PM      Failed - Cr in normal range and within 360 days    Creat  Date Value Ref Range Status  05/08/2023 2.27 (H) 0.70 - 1.28 mg/dL Final   Creatinine, Urine  Date Value Ref Range Status  09/09/2022 73  Final         Passed - Uric Acid in normal range and within 360 days    Uric Acid, Serum  Date Value Ref Range Status  05/08/2023 7.7 4.0 - 8.0 mg/dL Final    Comment:    Therapeutic target for gout patients: <6.0 mg/dL .          Passed - Valid encounter within last 12 months    Recent Outpatient Visits           3 months ago Annual physical exam   [facility] [place], Marsa PARAS, DO       Future Appointments             In 10 months Hester Alm BROCKS, MD [facility]            Passed - CBC within normal limits and completed in the last 12 months    WBC  Date Value Ref Range Status  05/08/2023 7.9 3.8 - 10.8 Thousand/uL Final   RBC  Date Value Ref Range Status  05/08/2023 3.25 (L) 4.20 - 5.80 Million/uL Final   Hemoglobin  Date Value Ref Range Status  05/08/2023 11.0 (L) 13.2 - 17.1 g/dL Final   HGB  Date Value Ref Range Status  04/01/2013 11.5 (L) 13.0 - 18.0 g/dL Final   HCT  Date Value Ref Range Status  05/08/2023 32.5 (L) 38.5 - 50.0 % Final  04/01/2013 33.0 (L) 40.0 - 52.0 % Final   MCHC  Date Value Ref Range Status  05/08/2023 33.8 32.0 - 36.0 g/dL Final    Comment:    For adults, a slight decrease in the calculated MCHC value (in the range of 30 to 32 g/dL) is most likely not clinically significant; however, it should be interpreted with caution in correlation with other red cell parameters and the patient's clinical condition.     [facility]  Date Value Ref Range Status  05/08/2023 33.8 (H) 27.0 - 33.0 pg Final   MCV  Date Value Ref Range Status  05/08/2023 100.0 80.0 - 100.0 fL Final  04/01/2013 92 80 - 100 fL Final   No results found for: PLTCOUNTKUC, LABPLAT, POCPLA RDW  Date Value Ref Range Status  05/08/2023 13.9 11.0 - 15.0 % Final  04/01/2013 12.9 11.5 - 14.5 % Final          tamsulosin  (FLOMAX ) 0.4 MG CAPS capsule [Pharmacy Med Name: TAMSULOSIN  CAP 0.4MG ] 90 capsule 0    Sig: TAKE 1 CAPSULE DAILY     Urology: Alpha-Adrenergic Blocker Passed - 08/24/2023  2:27 PM      Passed - PSA in normal range and within 360 days    PSA  Date Value Ref Range Status  05/08/2023 0.94 < OR = 4.00 ng/mL Final    Comment:    The total PSA value from this assay system is  standardized against the  WHO standard. The test  result will be approximately 20% lower when compared  to the equimolar-standardized total PSA (Beckman  Coulter). Comparison of serial PSA results should be  interpreted with this fact in mind. . This test was performed using the Siemens  chemiluminescent method. Values obtained from  different assay methods cannot be used interchangeably. PSA levels, regardless of value, should not be interpreted as absolute evidence of the presence or absence of disease.          Passed - Last BP in normal range    BP Readings from Last 1 Encounters:  05/15/23 (!) 124/58         Passed - Valid encounter within last 12 months    Recent Outpatient Visits           3 months ago Annual physical exam   [facility] [place], Marsa PARAS, DO       Future Appointments             In 10 months Hester Alm BROCKS, MD [facility]

## 2023-09-12 ENCOUNTER — Ambulatory Visit: Admitting: Podiatry

## 2023-09-12 DIAGNOSIS — E119 Type 2 diabetes mellitus without complications: Secondary | ICD-10-CM | POA: Diagnosis not present

## 2023-09-12 DIAGNOSIS — B351 Tinea unguium: Secondary | ICD-10-CM | POA: Diagnosis not present

## 2023-09-12 DIAGNOSIS — M79674 Pain in right toe(s): Secondary | ICD-10-CM | POA: Diagnosis not present

## 2023-09-12 DIAGNOSIS — M79675 Pain in left toe(s): Secondary | ICD-10-CM

## 2023-09-12 NOTE — Progress Notes (Signed)
This patient returns to my office for at risk foot care.  This patient requires this care by a professional since this patient will be at risk due to having  diabetes.  This patient is unable to cut nails himself since the patient cannot reach his nails.These nails are painful walking and wearing shoes.  This patient presents for at risk foot care today.  General Appearance  Alert, conversant and in no acute stress.  Vascular  Dorsalis pedis and posterior tibial  pulses are palpable  bilaterally.  Capillary return is within normal limits  bilaterally. Temperature is within normal limits  bilaterally.  Neurologic  Senn-Weinstein monofilament wire test within normal limits  bilaterally. Muscle power within normal limits bilaterally.  Nails Thick disfigured discolored nails with subungual debris  hallux nails  bilaterally. No evidence of bacterial infection or drainage bilaterally.  Orthopedic  No limitations of motion  feet .  No crepitus or effusions noted.  No bony pathology or digital deformities noted.  Skin  normotropic skin with no porokeratosis noted bilaterally.  No signs of infections or ulcers noted.     Onychomycosis  Pain in right toes  Pain in left toes  Consent was obtained for treatment procedures.   Mechanical debridement of nails 1-5  bilaterally performed with a nail nipper.  Filed with dremel without incident.    Return office visit     3 months                Told patient to return for periodic foot care and evaluation due to potential at risk complications.   Nicholes Rough D.P.M.

## 2023-10-24 ENCOUNTER — Ambulatory Visit (INDEPENDENT_AMBULATORY_CARE_PROVIDER_SITE_OTHER): Admitting: Podiatry

## 2023-10-24 DIAGNOSIS — E119 Type 2 diabetes mellitus without complications: Secondary | ICD-10-CM

## 2023-10-24 DIAGNOSIS — L97522 Non-pressure chronic ulcer of other part of left foot with fat layer exposed: Secondary | ICD-10-CM | POA: Diagnosis not present

## 2023-10-24 NOTE — Progress Notes (Signed)
 Subjective:  Patient ID: [name], male    DOB: 04/12/1945,  MRN: [id number]  Chief Complaint  Patient presents with   Toe Pain    Left foot pt stated that he thinks his toe rubbed against his shoe and caused a wound he stated that it has been like this for weeks     78 y.o. male presents for wound care. Patient presents with left hallux superficial blister formation.  He wanted to get it evaluated.  He is thinks that it may have happened from toe rubbing against his shoes/compression socks.  He is developing a wound.  He has not seen anyone else prior to seeing me for this.  Denies any other acute complaints.   Review of Systems: Negative except as noted in the HPI. Denies N/V/F/Ch.  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   Vertigo    due to H/O right acoustic neuroma    Current Outpatient Medications:    acetaminophen (TYLENOL) 500 MG tablet, Take 500 mg by mouth every 6 (six) hours as needed., Disp: , Rfl:    allopurinol  (ZYLOPRIM ) 100 MG tablet, TAKE 1 TABLET DAILY, Disp: 90 tablet, Rfl: 0   aspirin 325 MG tablet, Take 325 mg by mouth daily., Disp: , Rfl:    atorvastatin  (LIPITOR) 20 MG tablet, Take 1 tablet (20 mg total) by mouth at bedtime., Disp: 90 tablet, Rfl: 3   calcitRIOL (ROCALTROL) 0.25 MCG capsule, Take 0.25 mcg by mouth., Disp: , Rfl:    carvedilol  (COREG ) 25 MG tablet, TAKE 1 TABLET TWICE DAILY  WITH MEALS, Disp: 180 tablet,  Rfl: 3   Cholecalciferol 125 MCG (5000 UT) TABS, Take 5,000 Units by mouth daily., Disp: , Rfl:    colchicine  0.6 MG tablet, Take 2 tablets (1.2mg ) by mouth at first sign of gout flare, then take 1 tablet (0.6mg ) daily for up to 7-10 days or until flare is resolved, Disp: 30 tablet, Rfl: 5   cyanocobalamin (VITAMIN B12) 1000 MCG tablet, Take 3,000 mcg by mouth daily., Disp: , Rfl:    esomeprazole  (NEXIUM ) 20 MG capsule, Take 1 capsule (20 mg total) by mouth daily before breakfast., Disp: 90 capsule, Rfl: 3   glyBURIDE  (DIABETA ) 5 MG tablet, TAKE 2 TABLETS TWICE A DAY WITH MEALS, Disp: 360 tablet, Rfl: 3   GVOKE HYPOPEN  2-PACK 1 MG/0.2ML SOAJ, Inject 1 mg into the skin as needed (hypoglycemia)., Disp: 1 mL, Rfl: 3   hydrochlorothiazide (HYDRODIURIL) 25 MG tablet, Take 25 mg by mouth daily as needed., Disp: , Rfl:    hydrocortisone  valerate cream (WESTCORT ) 0.2 %, Apply 1 application topically 2 (two) times daily., Disp: 45 g, Rfl: 2   insulin  degludec (TRESIBA) 200 UNIT/ML FlexTouch Pen, Inject 70 Units into the skin daily., Disp: , Rfl:    insulin   lispro (HUMALOG) 100 UNIT/ML KwikPen, 21 Units 3 (three) times daily. + sliding scale, Disp: , Rfl:    Insulin  Pen Needle (BD PEN NEEDLE NANO 2ND GEN) 32G X 4 MM MISC, 4 shots per day, Disp: , Rfl:    Iodoquinol-Hydrocortisone -Aloe 1-1.9 % CREA, Apply 1 Application topically every other day., Disp: , Rfl:    ketoconazole  (NIZORAL ) 2 % cream, APPLY TO THE FACE AT BEDTIME MONDAY, WEDNESDAY, FRIDAY, Disp: 30 g, Rfl: 6   Lancets (ACCU-CHEK SOFT TOUCH) lancets, Use as instructed, Disp: 100 each, Rfl: 12   Magnesium  250 MG TABS, Take 1 tablet by mouth daily., Disp: , Rfl:    Multiple Vitamin (MULTI-VITAMIN) tablet, Take 1 tablet by mouth daily., Disp: , Rfl:    tamsulosin  (FLOMAX ) 0.4 MG CAPS capsule, TAKE 1 CAPSULE DAILY, Disp: 90 capsule, Rfl: 0   telmisartan (MICARDIS) 40 MG tablet, Take 40 mg by mouth daily., Disp: , Rfl:    torsemide (DEMADEX) 20 MG  tablet, Take 40 mg by mouth daily., Disp: , Rfl:   Social History   Tobacco Use  Smoking Status Never  Smokeless Tobacco Never  Tobacco Comments   tobacco use- no     Allergies  Allergen Reactions   Codeine     Dizzy   Pneumococcal Vaccines Hives and Swelling    Whole arm swelled up   Objective:  There were no vitals filed for this visit. There is no height or weight on file to calculate BMI. Constitutional Well developed. Well nourished.  Vascular Dorsalis pedis pulses palpable bilaterally. Posterior tibial pulses palpable bilaterally. Capillary refill normal to all digits.  No cyanosis or clubbing noted. Pedal hair growth normal.  Neurologic Normal speech. Oriented to person, place, and time. Protective sensation absent  Dermatologic Wound Location: Left hallux ulceration fat layer exposed does not probe down to deep tissue no clinical signs of infection noted no purulent drainage noted.  Does not probe down to bone Wound Base: Mixed Granular/Fibrotic Peri-wound: Calloused Exudate: Scant/small amount Serosanguinous exudate Wound Measurements: - See  Orthopedic: No pain to palpation either foot.   Radiographs: None Assessment:   1. Toe ulcer, left, with fat layer exposed (HCC)   2. Diabetes mellitus without complication (HCC)    Plan:  Patient was evaluated and treated and all questions answered.  Ulcer left hallux ulceration fat layer exposed -Debridement as below. -Dressed with Betadine wet-to-dry dressing, DSD. -Continue off-loading with surgical shoe.  Procedure: Excisional Debridement of Wound Tool: Sharp #312 chisel blade/tissue nipper Type of Debridement: Sharp Excisional Frequency: @Every  3 weeks until appropriately healed.  Dressing is to be changed daily/keeping the wound clean and dry Rationale: Removal of non-viable soft tissue from the wound to promote healing.  Anesthesia: none Pre-Debridement Wound Measurements: 0.4 cm cm x 0.5 cm cm x 0.2  cm cm  Post-Debridement Wound Measurements: 0.5 cm cm x 0.5 cm cm x 0.2 cm cm  Area devitalized tissue removed(nonviable tissue only): 0.1 cm x 0.1 cm.  Blood loss: Minimal (<50cc) Depth of Debridement: with fat layer exposed Description of tissue removed: Devitalized Tissue Technique: The wound and the surrounding skin were prepped and draped in usual aseptic fashion.  Aseptic technique was maintained throughout the procedure.  Using #312 blade/tissue nipper sharp debridement of necrotic/nonviable tissue was performed until healthy bleeding wound bed was achieved.  No underlying bone or tendon was exposed during debridement.  The wound was thoroughly irrigated with normal saline solution Wound Progress:  Current Wound Volume: Debridement was performed  of the chronic nonhealing diabetic foot wound on left foot left hallux.  Debridement removed 0.1 cm x 0.1 cm of the necrotic tissue and subcutaneous tissue and none amount of purulent drainage was not present. Presence/absence of tissue: Necrotic tissue/nonviable tissue present at the base of the wound.  Sharp debridement was performed to remove the necrotic tissue/nonviable tissue back to viable tissue.  No devitalized/nonviable tissue present postdebridement.  Wound appeared clean and clear of infection No material in the wound was present that was identified to be inhibiting healing. Dressing: Dry, sterile, compression dressing. Disposition: Patient tolerated procedure well. Patient to return in 1 week for follow-up or as listed above.

## 2023-11-10 ENCOUNTER — Ambulatory Visit: Admitting: Family Medicine

## 2023-11-10 ENCOUNTER — Encounter: Payer: Self-pay | Admitting: Family Medicine

## 2023-11-10 VITALS — BP 118/58 | HR 84 | Ht 65.0 in | Wt 258.2 lb

## 2023-11-10 DIAGNOSIS — H6991 Unspecified Eustachian tube disorder, right ear: Secondary | ICD-10-CM | POA: Diagnosis not present

## 2023-11-10 DIAGNOSIS — J0110 Acute frontal sinusitis, unspecified: Secondary | ICD-10-CM

## 2023-11-10 DIAGNOSIS — R051 Acute cough: Secondary | ICD-10-CM | POA: Diagnosis not present

## 2023-11-10 DIAGNOSIS — J069 Acute upper respiratory infection, unspecified: Secondary | ICD-10-CM

## 2023-11-10 MED ORDER — AZITHROMYCIN 250 MG PO TABS: ORAL_TABLET | ORAL | 0 refills | Status: DC

## 2023-11-10 MED ORDER — PROMETHAZINE-DM 6.25-15 MG/5ML PO SYRP: 5.0000 mL | ORAL_SOLUTION | Freq: Four times a day (QID) | ORAL | 0 refills | Status: DC | PRN

## 2023-11-10 MED ORDER — BENZONATATE 100 MG PO CAPS: 100.0000 mg | ORAL_CAPSULE | Freq: Three times a day (TID) | ORAL | 0 refills | Status: DC | PRN

## 2023-11-10 MED ORDER — IPRATROPIUM BROMIDE 0.06 % NA SOLN: 2.0000 | Freq: Four times a day (QID) | NASAL | 0 refills | Status: AC

## 2023-11-10 NOTE — Progress Notes (Signed)
**Note Martin-Identified via Obfuscation**  Subjective:    Patient ID: [name], male    DOB: 06/05/1945, 78 y.o.   MRN: [id number]  [name] is a 78 y.o. male presenting on 11/10/2023 for Ear Pain and Cough (Started yesterday )  Patient presents for a same day appointment.   HPI  Discussed the use of AI scribe software for clinical note transcription with the patient, who gave verbal consent to proceed.  History of Present Illness   [name] is a 78 year old male who presents with right ear pain and cough.  Acute Right otalgia and hearing loss - Onset yesterday - Right ear pain described as 'pressing' when headphones are worn - Associated with sensation of pressure and congestion - No prior history of earache - Impaired hearing in right ear: 'I don't hear it at all around my right ear' - Attempted to clear ear wax at home but not successful  Sinusitis / Upper respiratory symptoms - Cough onset yesterday - Congestion present - Sensation of pressure attributed to sinuses - Feels hot, suggesting possible fever - Recent exposure to grandson who works in a daycare center, raising suspicion for viral illness - History of using nasal sprays for sinus issues (current usage not specified)  Medication use and allergies - 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, two tablets daily for hand arthritis - Tried nighttime capsules for cough once , NyQuil - Allergy to codeine          05/15/2023    8:05 AM 11/08/2022    9:12 AM 05/05/2022    8:38 AM  Depression screen PHQ 2/9  Decreased Interest 0 0 0  Down, Depressed, Hopeless 0 0 0  PHQ - 2 Score 0 0 0  Altered sleeping 1 0   Tired, decreased energy 0 0   Change in appetite 0 0   Feeling bad or failure about yourself  0 0   Trouble concentrating 0 0   Moving slowly or fidgety/restless 0 0   Suicidal thoughts  0   PHQ-9 Score 1  0    Difficult doing work/chores Not difficult at all Not difficult at all      Data saved with a previous flowsheet row definition        05/15/2023    8:05 AM 11/08/2022    9:13 AM 05/05/2022    8:38 AM 01/26/2022    8:22 AM  GAD 7 : Generalized Anxiety Score  Nervous, Anxious, on Edge 0 0 0 0  Control/stop worrying 0 0 0 0  Worry too much - different things 0 0 0 0  Trouble relaxing 0 0 0 0  Restless 0 0 0 0  Easily annoyed or irritable 0 0 0 0  Afraid - awful might happen 0 0 0 0  Total GAD 7 Score 0 0 0 0  Anxiety Difficulty    Not difficult at all    Social History   Tobacco Use   Smoking status: Never   Smokeless tobacco: Never   Tobacco comments:    tobacco use- no   Vaping Use   Vaping status: Never Used  Substance Use Topics   Alcohol use: Never   Drug use: No    Review of Systems Per HPI unless specifically indicated above     Objective:    BP (!) 118/58 (BP Location: Left Arm, Patient Position: Sitting, Cuff Size: Large)   Pulse 84   Ht 5' 5 (1.651 m)   Wt 258 lb 4  oz (117.1 kg)   SpO2 95%   BMI 42.98 kg/m   Wt Readings from Last 3 Encounters:  11/10/23 258 lb 4 oz (117.1 kg)  05/15/23 258 lb 6 oz (117.2 kg)  01/17/23 255 lb 3.2 oz (115.8 kg)    Physical Exam Vitals and nursing note reviewed.  Constitutional:      General: He is not in acute distress.    Appearance: He is well-developed. He is not diaphoretic.     Comments: Well-appearing, comfortable, cooperative  HENT:     Head: Normocephalic and atraumatic.     Right Ear: Ear canal and external ear normal. There is impacted cerumen.     Left Ear: Tympanic membrane, ear canal and external ear normal. There is no impacted cerumen.     Nose: Congestion present.  Eyes:     General:        Right eye: No discharge.        Left eye: No discharge.     Conjunctiva/sclera: Conjunctivae normal.  Neck:     Thyroid: No thyromegaly.  Cardiovascular:     Rate and Rhythm: Normal rate and regular rhythm.     Pulses: Normal pulses.     Heart sounds: Normal heart sounds. No murmur heard. Pulmonary:     Effort: Pulmonary effort is normal.  No respiratory distress.     Breath sounds: Normal breath sounds. No wheezing or rales.     Comments: Cough Musculoskeletal:        General: Normal range of motion.     Cervical back: Normal range of motion and neck supple.  Lymphadenopathy:     Cervical: No cervical adenopathy.  Skin:    General: Skin is warm and dry.     Findings: No erythema or rash.  Neurological:     Mental Status: He is alert and oriented to person, place, and time. Mental status is at baseline.  Psychiatric:        Behavior: Behavior normal.     Comments: Well groomed, good eye contact, normal speech and thoughts     Results for orders placed or performed in visit on 05/08/23  TSH   Collection Time: 05/08/23  7:56 AM  Result Value Ref Range   TSH 0.85 0.40 - 4.50 mIU/L  Uric acid   Collection Time: 05/08/23  7:56 AM  Result Value Ref Range   Uric Acid, Serum 7.7 4.0 - 8.0 mg/dL  PSA   Collection Time: 05/08/23  7:56 AM  Result Value Ref Range   PSA 0.94 < OR = 4.00 ng/mL  CBC with Differential/Platelet   Collection Time: 05/08/23  7:56 AM  Result Value Ref Range   WBC 7.9 3.8 - 10.8 Thousand/uL   RBC 3.25 (L) 4.20 - 5.80 Million/uL   Hemoglobin 11.0 (L) 13.2 - 17.1 g/dL   HCT 67.4 (L) 61.4 - 49.9 %   MCV 100.0 80.0 - 100.0 fL   MCH 33.8 (H) 27.0 - 33.0 pg   MCHC 33.8 32.0 - 36.0 g/dL   RDW 86.0 88.9 - 84.9 %   Platelets 195 140 - 400 Thousand/uL   MPV 11.4 7.5 - 12.5 fL   Neutro Abs 3,721 1,500 - 7,800 cells/uL   Absolute Lymphocytes 2,963 850 - 3,900 cells/uL   Absolute Monocytes 695 200 - 950 cells/uL   Eosinophils Absolute 490 15 - 500 cells/uL   Basophils Absolute 32 0 - 200 cells/uL   Neutrophils Relative % 47.1 %   Total Lymphocyte 37.5 %  Monocytes Relative 8.8 %   Eosinophils Relative 6.2 %   Basophils Relative 0.4 %  COMPLETE METABOLIC PANEL WITH GFR   Collection Time: 05/08/23  7:56 AM  Result Value Ref Range   Glucose, Bld 87 65 - 99 mg/dL   BUN 57 (H) 7 - 25 mg/dL    Creat 7.72 (H) 9.29 - 1.28 mg/dL   BUN/Creatinine Ratio 25 (H) 6 - 22 (calc)   Sodium 140 135 - 146 mmol/L   Potassium 4.0 3.5 - 5.3 mmol/L   Chloride 101 98 - 110 mmol/L   CO2 30 20 - 32 mmol/L   Calcium  9.3 8.6 - 10.3 mg/dL   Total Protein 6.4 6.1 - 8.1 g/dL   Albumin 4.2 3.6 - 5.1 g/dL   Globulin 2.2 1.9 - 3.7 g/dL (calc)   AG Ratio 1.9 1.0 - 2.5 (calc)   Total Bilirubin 0.4 0.2 - 1.2 mg/dL   Alkaline phosphatase (APISO) 74 35 - 144 U/L   AST 15 10 - 35 U/L   ALT 15 9 - 46 U/L  Lipid panel   Collection Time: 05/08/23  7:56 AM  Result Value Ref Range   Cholesterol 130 <200 mg/dL   HDL 33 (L) > OR = 40 mg/dL   Triglycerides 627 (H) <150 mg/dL   LDL Cholesterol (Calc) 58 mg/dL (calc)   Total CHOL/HDL Ratio 3.9 <5.0 (calc)   Non-HDL Cholesterol (Calc) 97 <869 mg/dL (calc)  Hemoglobin J8r   Collection Time: 05/08/23  7:56 AM  Result Value Ref Range   Hgb A1c MFr Bld 7.7 (H) <5.7 %   Mean Plasma Glucose 174 mg/dL   eAG (mmol/L) 9.7 mmol/L      Assessment & Plan:   Problem List Items Addressed This Visit   None Visit Diagnoses       Acute non-recurrent frontal sinusitis    -  Primary   Relevant Medications   promethazine-dextromethorphan (PROMETHAZINE-DM) 6.25-15 MG/5ML syrup   ipratropium (ATROVENT) 0.06 % nasal spray   azithromycin (ZITHROMAX Z-PAK) 250 MG tablet   benzonatate (TESSALON) 100 MG capsule     Upper respiratory tract infection, unspecified type       Relevant Medications   azithromycin (ZITHROMAX Z-PAK) 250 MG tablet     Acute cough       Relevant Medications   promethazine-dextromethorphan (PROMETHAZINE-DM) 6.25-15 MG/5ML syrup   benzonatate (TESSALON) 100 MG capsule     Dysfunction of right eustachian tube           Acute Sinusitis / R Ear pain with eustachian tube dysfunction likely Differential includes viral or early sinus infection. No bacterial signs, but antibiotic offered if no improvement in 48 hours. Discussed viral etiology and optional  home testing for flu or COVID if symptoms persist.  Start Atrovent nasal spray decongestant 2 sprays in each nostril up to 4 times daily for 7 days Start Tessalon Perls take 1 capsule up to 3 times a day as needed for cough - Prescribed non-codeine cough syrup for nighttime use. - Prescribed azithromycin (Z-Pak) if no improvement in 48 hours. Only use as back up plan if not improved - Recommended ibuprofen for pain relief if Tylenol is insufficient. - Advised home testing for flu or COVID if symptoms persist. Patient declined test today  - Scheduled follow-up on November 17th, 2025, with option to flush ear if bothersome.        No orders of the defined types were placed in this encounter.   Meds ordered this  encounter  Medications   promethazine-dextromethorphan (PROMETHAZINE-DM) 6.25-15 MG/5ML syrup    Sig: Take 5 mLs by mouth 4 (four) times daily as needed for cough.    Dispense:  118 mL    Refill:  0   ipratropium (ATROVENT) 0.06 % nasal spray    Sig: Place 2 sprays into both nostrils 4 (four) times daily. For up to 5-7 days then stop.    Dispense:  15 mL    Refill:  0   azithromycin (ZITHROMAX Z-PAK) 250 MG tablet    Sig: Take 2 tabs (500mg  total) on Day 1. Take 1 tab (250mg ) daily for next 4 days.    Dispense:  6 tablet    Refill:  0   benzonatate (TESSALON) 100 MG capsule    Sig: Take 1 capsule (100 mg total) by mouth 3 (three) times daily as needed for cough.    Dispense:  30 capsule    Refill:  0    Follow up plan: Return if symptoms worsen or fail to improve.  Marsa Officer, DO [facility] 11/10/2023, 9:08 AM

## 2023-11-10 NOTE — Patient Instructions (Signed)
   Please schedule a Follow-up Appointment to: No follow-ups on file.  If you have any other questions or concerns, please feel free to call the office or send a message through MyChart. You may also schedule an earlier appointment if necessary.  Additionally, you may be receiving a survey about your experience at our office within a few days to 1 week by e-mail or mail. We value your feedback.  Saralyn Pilar, DO [facility], [place]

## 2023-11-13 ENCOUNTER — Ambulatory Visit: Admitting: Internal Medicine

## 2023-11-14 ENCOUNTER — Ambulatory Visit: Admitting: Podiatry

## 2023-11-14 DIAGNOSIS — L97522 Non-pressure chronic ulcer of other part of left foot with fat layer exposed: Secondary | ICD-10-CM

## 2023-11-14 DIAGNOSIS — Q666 Other congenital valgus deformities of feet: Secondary | ICD-10-CM | POA: Diagnosis not present

## 2023-11-14 NOTE — Progress Notes (Signed)
 Subjective:  Patient ID: [name], male    DOB: 03/26/1945,  MRN: [id number]  Chief Complaint  Patient presents with   Foot Ulcer    Left foot toe ulcer follow up     78 y.o. male presents for wound care.  Patient presents for left hallux superficial ulceration he states is doing a lot better has completely healed up denies any other acute complaints.  Been doing Betadine wet-to-dry dressing he would like to discuss shoe gear modification orthotics option as well.   Review of Systems: Negative except as noted in the HPI. Denies N/V/F/Ch.  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 Take 1 tab (250mg ) daily for next 4 days., Disp: 6 tablet, Rfl: 0   benzonatate (TESSALON) 100 MG capsule, Take 1 capsule (100 mg total) by mouth 3 (three) times daily as needed for cough., Disp: 30 capsule, Rfl: 0   calcitRIOL  (ROCALTROL) 0.25 MCG capsule, Take 0.25 mcg by mouth., Disp: , Rfl:    carvedilol  (COREG ) 25 MG tablet, TAKE 1 TABLET TWICE DAILY  WITH MEALS, Disp: 180 tablet, Rfl: 3   Cholecalciferol 125 MCG (5000 UT) TABS, Take 5,000 Units by mouth daily., Disp: , Rfl:    colchicine  0.6 MG tablet, Take 2 tablets (1.2mg ) by mouth at first sign of gout flare, then take 1 tablet (0.6mg ) daily for up to 7-10 days or until flare is resolved, Disp: 30 tablet, Rfl: 5   cyanocobalamin (VITAMIN B12) 1000 MCG tablet, Take 3,000 mcg by mouth daily., Disp: , Rfl:    esomeprazole  (NEXIUM ) 20 MG capsule, Take 1 capsule (20 mg total) by mouth daily before breakfast., Disp: 90 capsule, Rfl: 3   glyBURIDE  (DIABETA ) 5 MG tablet, TAKE 2 TABLETS TWICE A DAY WITH MEALS, Disp: 360 tablet, Rfl: 3   GVOKE HYPOPEN  2-PACK 1 MG/0.2ML SOAJ, Inject 1 mg into the skin as needed (hypoglycemia)., Disp: 1 mL, Rfl: 3   hydrochlorothiazide (HYDRODIURIL) 25 MG tablet, Take 25 mg by mouth daily as needed., Disp: , Rfl:    hydrocortisone  valerate cream (WESTCORT ) 0.2 %, Apply 1 application topically 2 (two) times daily., Disp: 45 g, Rfl: 2  insulin  degludec (TRESIBA) 200 UNIT/ML FlexTouch Pen, Inject 70 Units into the skin daily., Disp: , Rfl:    insulin  lispro (HUMALOG) 100 UNIT/ML KwikPen, 21 Units 3 (three) times daily. + sliding scale, Disp: , Rfl:    Insulin  Pen Needle (BD PEN NEEDLE NANO 2ND GEN) 32G X 4 MM MISC, 4 shots per day, Disp: , Rfl:    Iodoquinol-Hydrocortisone -Aloe 1-1.9 % CREA, Apply 1 Application topically every other day., Disp: , Rfl:    ipratropium (ATROVENT) 0.06 % nasal spray, Place 2 sprays into both nostrils 4 (four) times daily. For up to 5-7 days then stop., Disp: 15 mL, Rfl: 0   ketoconazole  (NIZORAL ) 2 % cream, APPLY TO THE FACE AT BEDTIME MONDAY, WEDNESDAY, FRIDAY, Disp: 30 g, Rfl: 6   Lancets (ACCU-CHEK SOFT TOUCH) lancets, Use as instructed, Disp: 100 each, Rfl: 12   Multiple Vitamin (MULTI-VITAMIN) tablet, Take 1  tablet by mouth daily., Disp: , Rfl:    promethazine-dextromethorphan (PROMETHAZINE-DM) 6.25-15 MG/5ML syrup, Take 5 mLs by mouth 4 (four) times daily as needed for cough., Disp: 118 mL, Rfl: 0   tamsulosin  (FLOMAX ) 0.4 MG CAPS capsule, TAKE 1 CAPSULE DAILY, Disp: 90 capsule, Rfl: 0   telmisartan (MICARDIS) 40 MG tablet, Take 40 mg by mouth daily., Disp: , Rfl:    torsemide (DEMADEX) 20 MG tablet, Take 40 mg by mouth daily., Disp: , Rfl:   Social History   Tobacco Use  Smoking Status Never  Smokeless Tobacco Never  Tobacco Comments   tobacco use- no     Allergies  Allergen Reactions   Codeine     Dizzy   Pneumococcal Vaccines Hives and Swelling    Whole arm swelled up   Objective:  There were no vitals filed for this visit. There is no height or weight on file to calculate BMI. Constitutional Well developed. Well nourished.  Vascular Dorsalis pedis pulses palpable bilaterally. Posterior tibial pulses palpable bilaterally. Capillary refill normal to all digits.  No cyanosis or clubbing noted. Pedal hair growth normal.  Neurologic Normal speech. Oriented to person, place, and time. Protective sensation absent  Dermatologic Left hallux ulceration completely epithelialized no further signs of breakdown noted no openings noted.  Orthopedic: No pain to palpation either foot.   Radiographs: None Assessment:   1. Toe ulcer, left, with fat layer exposed (HCC)   2. Pes planovalgus     Plan:  Patient was evaluated and treated and all questions answered.  Ulcer left hallux ulceration fat layer exposed~improving - Clinically healed and officially discharged from my care I discussed shoe gear modification and orthotics option in extensive detail he states understand like to proceed with orthotics  Pes planovalgus/foot deformity -I explained to patient the etiology of pes planovalgus and relationship with heel pain/arch pain and various treatment options were discussed.  Given  patient foot structure in the setting of heel pain/arch pain I believe patient will benefit from custom-made orthotics to help control the hindfoot motion support the arch of the foot and take the stress away from arches.  Patient agrees with the plan like to proceed with orthotics -Patient was casted for orthotics

## 2023-11-20 ENCOUNTER — Encounter: Payer: Self-pay | Admitting: Family Medicine

## 2023-11-20 ENCOUNTER — Ambulatory Visit: Admitting: Family Medicine

## 2023-11-20 VITALS — BP 132/84 | HR 97 | Ht 65.0 in | Wt 253.2 lb

## 2023-11-20 DIAGNOSIS — D333 Benign neoplasm of cranial nerves: Secondary | ICD-10-CM

## 2023-11-20 DIAGNOSIS — E1121 Type 2 diabetes mellitus with diabetic nephropathy: Secondary | ICD-10-CM

## 2023-11-20 DIAGNOSIS — E1165 Type 2 diabetes mellitus with hyperglycemia: Secondary | ICD-10-CM

## 2023-11-20 DIAGNOSIS — H903 Sensorineural hearing loss, bilateral: Secondary | ICD-10-CM

## 2023-11-20 DIAGNOSIS — N183 Chronic kidney disease, stage 3 unspecified: Secondary | ICD-10-CM

## 2023-11-20 DIAGNOSIS — Z794 Long term (current) use of insulin: Secondary | ICD-10-CM | POA: Diagnosis not present

## 2023-11-20 DIAGNOSIS — I129 Hypertensive chronic kidney disease with stage 1 through stage 4 chronic kidney disease, or unspecified chronic kidney disease: Secondary | ICD-10-CM | POA: Diagnosis not present

## 2023-11-20 DIAGNOSIS — N1832 Chronic kidney disease, stage 3b: Secondary | ICD-10-CM

## 2023-11-20 DIAGNOSIS — I25810 Atherosclerosis of coronary artery bypass graft(s) without angina pectoris: Secondary | ICD-10-CM

## 2023-11-20 LAB — POCT GLYCOSYLATED HEMOGLOBIN (HGB A1C): Hemoglobin A1C: 7.4 % — AB (ref 4.0–5.6)

## 2023-11-20 NOTE — Patient Instructions (Addendum)
 Thank you for coming to the office today.  I do think the blood sugar can be reacting to recent illness.  If you still experience high sugars, I would suggest increasing Tresiba. You can split the dose with 40 units twice a day with meal for now. And can consider inching up forward.  Possibility of new diabetes medication option through Dr Cherilyn or Dr Marcelino / Dennise  - Can ask about Januvia or Jardiance next two options - Jardiance helps the kidneys - Januvia is more mild, you have been on it before, but possibility of pancreatitis risk  Keep up with [facility] in January  For the passing out spells I do recommend discussing with Cardiology Avelina) 12/15/23 and then if need after January 2026   Please schedule a Follow-up Appointment to: Return in about 2 months (around 01/20/2024).  If you have any other questions or concerns, please feel free to call the office or send a message through MyChart. You may also schedule an earlier appointment if necessary.  Additionally, you may be receiving a survey about your experience at our office within a few days to 1 week by e-mail or mail. We value your feedback.  Marsa Officer, DO [facility], [place]

## 2023-11-20 NOTE — Progress Notes (Unsigned)
 Subjective:    Patient ID: [name], male    DOB: Jul 09, 1945, 78 y.o.   MRN: [id number]  [name] is a 78 y.o. male presenting on 11/20/2023 for Medical Management of Chronic Issues   HPI  Last visit 10 days ago, had R cerumen impaction ***2am predictable headache and earache ***[facility] / [facility]iology, upcoming now 01/08/24 ***He was able to move the apt from 11/2024 to 01/2024  ***He does admits some near syncope spells or episodes, falling head first and didn't catch himself ***Often will get some sort of warning, especially if bend over or forward ***He has Cardiologist apt 12/15/23   Acute Right otalgia and hearing loss - Onset yesterday - Right ear pain described as 'pressing' when headphones are worn - Associated with sensation of pressure and congestion - No prior history of earache - Impaired hearing in right ear: 'I don't hear it at all around my right ear' - Attempted to clear ear wax at home but not successful   Sinusitis / Upper respiratory symptoms - Cough onset yesterday - Congestion present - Sensation of pressure attributed to sinuses - Feels hot, suggesting possible fever - Recent exposure to grandson who works in a daycare center, raising suspicion for viral illness - History of using nasal sprays for sinus issues (current usage not specified)   Medication use and allergies - 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, two tablets daily for hand arthritis - Tried nighttime capsules for cough once , NyQuil - Allergy to codeine     CHRONIC DM, Type 2: Chronic problem.   *** Followed by [facility] Endocrinology Dr Cherilyn *** On Glyburide , [place], daily Novolog   His diabetes management includes checking blood sugar multiple times a day and adjusting his insulin  regimen by reducing insulin  at night to prevent low blood sugars and increasing it in the morning. His recent A1c was 7.7, up from 7.5, despite blood sugars averaging between 80 and 100. Followed by [facility]  Endocrinology Dr Cherilyn Using CGM Managed on Basal insulin  [place] and Humalog mealtime, also Glyburide  per Endocrine  - Currently on Tresiba 66-70 units, Humalog 21 units with breakfast, and 10-12u with PM meal - On Glyburide  5mg  daily with meal OFF Metformin  Jardiance He does admit to overnight hypoglycemia, resolved with oral glucose intake, he has GVOKE pen as emergency if needed. Reports  good compliance. Tolerating well w/o side-effects Currently on [facility]) Admits occasional hypoglycemia Denies polyuria, visual changes, numbness or tingling   Health Maintenance: ***     11/20/2023    8:16 AM 05/15/2023    8:05 AM 11/08/2022    9:12 AM  Depression screen PHQ 2/9  Decreased Interest 1 0 0  Down, Depressed, Hopeless 1 0 0  PHQ - 2 Score 2 0 0  Altered sleeping 3 1 0  Tired, decreased energy 1 0 0  Change in appetite 1 0 0  Feeling bad or failure about yourself  1 0 0  Trouble concentrating 0 0 0  Moving slowly or fidgety/restless 0 0 0  Suicidal thoughts 0  0  PHQ-9 Score 8 1  0   Difficult doing work/chores Somewhat difficult Not difficult at all Not difficult at all     Data saved with a previous flowsheet row definition       11/20/2023    8:16 AM 05/15/2023    8:05 AM 11/08/2022    9:13 AM 05/05/2022    8:38 AM  GAD 7 :  Generalized Anxiety Score  Nervous, Anxious, on Edge 1 0 0 0  Control/stop worrying 1 0 0 0  Worry too much - different things 1 0 0 0  Trouble relaxing 0 0 0 0  Restless 0 0 0 0  Easily annoyed or irritable 0 0 0 0  Afraid - awful might happen 1 0 0 0  Total GAD 7 Score 4 0 0 0  Anxiety Difficulty Somewhat difficult       Social History   Tobacco Use   Smoking status: Never   Smokeless tobacco: Never   Tobacco comments:    tobacco use- no   Vaping Use   Vaping status: Never Used  Substance Use Topics   Alcohol use: Never   Drug use: No    Review of Systems Per HPI unless specifically  indicated above     Objective:    BP 132/84 (BP Location: Right Arm, Patient Position: Sitting, Cuff Size: Normal)   Pulse 97   Ht 5' 5 (1.651 m)   Wt 253 lb 4 oz (114.9 kg)   SpO2 98%   BMI 42.14 kg/m   Wt Readings from Last 3 Encounters:  11/20/23 253 lb 4 oz (114.9 kg)  11/10/23 258 lb 4 oz (117.1 kg)  05/15/23 258 lb 6 oz (117.2 kg)    Physical Exam  Results for orders placed or performed in visit on 11/20/23  POCT HgB A1C   Collection Time: 11/20/23  8:25 AM  Result Value Ref Range   Hemoglobin A1C 7.4 (A) 4.0 - 5.6 %   HbA1c POC (<> result, manual entry)     HbA1c, POC (prediabetic range)     HbA1c, POC (controlled diabetic range)        Assessment & Plan:   Problem List Items Addressed This Visit     Acoustic neuroma (HCC)   Asymmetrical sensorineural hearing loss   Benign hypertension with CKD (chronic kidney disease) stage III (HCC)   CAD, ARTERY BYPASS GRAFT   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   Follow up plan: Return in about 2 months (around 01/20/2024).  Future labs ordered for ***  *** Future consider Neurologist for headaches / near syncope if negative work up from [facility] Acoustic Neuroma   Marsa Officer, DO [facility] 11/20/2023, 8:43 AM

## 2023-11-21 DIAGNOSIS — N1832 Chronic kidney disease, stage 3b: Secondary | ICD-10-CM | POA: Insufficient documentation

## 2023-12-13 ENCOUNTER — Other Ambulatory Visit: Payer: Self-pay | Admitting: Family Medicine

## 2023-12-13 DIAGNOSIS — M1A0790 Idiopathic chronic gout, unspecified ankle and foot, without tophus (tophi): Secondary | ICD-10-CM

## 2023-12-13 DIAGNOSIS — Z794 Long term (current) use of insulin: Secondary | ICD-10-CM

## 2023-12-13 LAB — OPHTHALMOLOGY REPORT-SCANNED

## 2023-12-15 NOTE — Telephone Encounter (Signed)
 Requested Prescriptions  Pending Prescriptions Disp Refills   allopurinol  (ZYLOPRIM ) 100 MG tablet [Pharmacy Med Name: ALLOPURINOL   TAB 100MG ] 90 tablet 1    Sig: TAKE 1 TABLET DAILY     Endocrinology:  Gout Agents - allopurinol  Failed - 12/15/2023 12:29 PM      Failed - Cr in normal range and within 360 days    Creat  Date Value Ref Range Status  05/08/2023 2.27 (H) 0.70 - 1.28 mg/dL Final   Creatinine, Urine  Date Value Ref Range Status  09/09/2022 73  Final         Passed - Uric Acid in normal range and within 360 days    Uric Acid, Serum  Date Value Ref Range Status  05/08/2023 7.7 4.0 - 8.0 mg/dL Final    Comment:    Therapeutic target for gout patients: <6.0 mg/dL .          Passed - Valid encounter within last 12 months    Recent Outpatient Visits           3 weeks ago Type 2 diabetes mellitus with diabetic nephropathy, with long-term current use of insulin  [facility])   [place] [facility] [place], Marsa PARAS, DO   1 month ago Acute non-recurrent frontal sinusitis   [place] [facility] [place], Marsa PARAS, DO   7 months ago Annual physical exam   [facility] [place] Marsa PARAS, DO       Future Appointments             In 6 months Hester Alm BROCKS, MD [facility]            Passed - CBC within normal limits and completed in the last 12 months    WBC  Date Value Ref Range Status  05/08/2023 7.9 3.8 - 10.8 Thousand/uL Final   RBC  Date Value Ref Range Status  05/08/2023 3.25 (L) 4.20 - 5.80 Million/uL Final   Hemoglobin  Date Value Ref Range Status  05/08/2023 11.0 (L) 13.2 - 17.1 g/dL Final   HGB  Date Value Ref Range Status  04/01/2013 11.5 (L) 13.0 - 18.0 g/dL Final   HCT  Date Value Ref Range Status  05/08/2023 32.5 (L) 38.5 - 50.0 % Final  04/01/2013 33.0 (L) 40.0 - 52.0 % Final   MCHC  Date Value Ref Range Status  05/08/2023 33.8 32.0  - 36.0 g/dL Final    Comment:    For adults, a slight decrease in the calculated MCHC value (in the range of 30 to 32 g/dL) is most likely not clinically significant; however, it should be interpreted with caution in correlation with other red cell parameters and the patient's clinical condition.    [facility]  Date Value Ref Range Status  05/08/2023 33.8 (H) 27.0 - 33.0 pg Final   MCV  Date Value Ref Range Status  05/08/2023 100.0 80.0 - 100.0 fL Final  04/01/2013 92 80 - 100 fL Final   No results found for: PLTCOUNTKUC, LABPLAT, POCPLA RDW  Date Value Ref Range Status  05/08/2023 13.9 11.0 - 15.0 % Final  04/01/2013 12.9 11.5 - 14.5 % Final          glyBURIDE  (DIABETA ) 5 MG tablet [Pharmacy Med Name: GLYBURIDE (D) TAB 5MG ] 360 tablet 1    Sig: TAKE 2 TABLETS TWICE A DAY WITH MEALS     Endocrinology:  Diabetes - Sulfonylureas - glyburide  Failed -  12/15/2023 12:29 PM      Failed - HBA1C in normal range and within 180 days    Hemoglobin A1C  Date Value Ref Range Status  11/20/2023 7.4 (A) 4.0 - 5.6 % Final  03/16/2020 7.0  Final   Hgb A1c MFr Bld  Date Value Ref Range Status  05/08/2023 7.7 (H) <5.7 % Final    Comment:    For someone without known diabetes, a hemoglobin A1c value of 6.5% or greater indicates that they may have  diabetes and this should be confirmed with a follow-up  test. . For someone with known diabetes, a value <7% indicates  that their diabetes is well controlled and a value  greater than or equal to 7% indicates suboptimal  control. A1c targets should be individualized based on  duration of diabetes, age, comorbid conditions, and  other considerations. . Currently, no consensus exists regarding use of hemoglobin A1c for diagnosis of diabetes for children. .          Failed - Cr in normal range and within 360 days    Creat  Date Value Ref Range Status  05/08/2023 2.27 (H) 0.70 - 1.28 mg/dL Final   Creatinine, Urine  Date Value Ref  Range Status  09/09/2022 73  Final         Failed - eGFR is 60 or above and within 360 days    GFR, Est African American  Date Value Ref Range Status  01/08/2020 31 (L) > OR = 60 mL/min/1.104m2 Final   GFR, Est Non African American  Date Value Ref Range Status  01/08/2020 26 (L) > OR = 60 mL/min/1.23m2 Final   GFR  Date Value Ref Range Status  01/17/2012 50.34 (L) >60.00 mL/min Final   eGFR  Date Value Ref Range Status  04/27/2022 30 (L) > OR = 60 mL/min/1.46m2 Final         Passed - Valid encounter within last 6 months    Recent Outpatient Visits           3 weeks ago Type 2 diabetes mellitus with diabetic nephropathy, with long-term current use of insulin  [facility])   [place] [facility] [place], [place] J, DO   1 month ago Acute non-recurrent frontal sinusitis   [place] [facility] [place], Marsa PARAS, DO   7 months ago Annual physical exam   [place] [facility] [place] Marsa PARAS, DO       Future Appointments             In 6 months Hester Alm BROCKS, MD [place] [facility]             tamsulosin  (FLOMAX ) 0.4 MG CAPS capsule [Pharmacy Med Name: TAMSULOSIN  CAP 0.4MG ] 90 capsule 0    Sig: TAKE 1 CAPSULE DAILY     Urology: Alpha-Adrenergic Blocker Passed - 12/15/2023 12:29 PM      Passed - PSA in normal range and within 360 days    PSA  Date Value Ref Range Status  05/08/2023 0.94 < OR = 4.00 ng/mL Final    Comment:    The total PSA value from this assay system is  standardized against the WHO standard. The test  result will be approximately 20% lower when compared  to the equimolar-standardized total PSA (Beckman  Coulter). Comparison of serial PSA results should be  interpreted with this fact in mind. . This test was performed using the Siemens  chemiluminescent  method. Values obtained from  different assay methods cannot be used interchangeably. PSA levels,  regardless of value, should not be interpreted as absolute evidence of the presence or absence of disease.          Passed - Last BP in normal range    BP Readings from Last 1 Encounters:  11/20/23 132/84         Passed - Valid encounter within last 12 months    Recent Outpatient Visits           3 weeks ago Type 2 diabetes mellitus with diabetic nephropathy, with long-term current use of insulin  [facility])   [place] [facility] [place], Marsa PARAS, DO   1 month ago Acute non-recurrent frontal sinusitis   [place] [facility] [place] Mars[place] PARAS, DO   7 months ago Annual physical exam    [facility] [place] PARAS, DO       Future Appointments             In 6 months Hester Alm BROCKS, MD [facility]

## 2023-12-15 NOTE — Telephone Encounter (Signed)
 Requested medications are due for refill today.  unsure  Requested medications are on the active medications list.  ni  Last refill. 08/24/2023 #90 0 rf  Future visit scheduled.   yes  Notes to clinic.  Please review.    Requested Prescriptions  Pending Prescriptions Disp Refills   tamsulosin  (FLOMAX ) 0.4 MG CAPS capsule [Pharmacy Med Name: TAMSULOSIN  CAP 0.4MG ] 90 capsule 0    Sig: TAKE 1 CAPSULE DAILY     Urology: Alpha-Adrenergic Blocker Passed - 12/15/2023 12:30 PM      Passed - PSA in normal range and within 360 days    PSA  Date Value Ref Range Status  05/08/2023 0.94 < OR = 4.00 ng/mL Final    Comment:    The total PSA value from this assay system is  standardized against the WHO standard. The test  result will be approximately 20% lower when compared  to the equimolar-standardized total PSA (Beckman  Coulter). Comparison of serial PSA results should be  interpreted with this fact in mind. . This test was performed using the Siemens  chemiluminescent method. Values obtained from  different assay methods cannot be used interchangeably. PSA levels, regardless of value, should not be interpreted as absolute evidence of the presence or absence of disease.          Passed - Last BP in normal range    BP Readings from Last 1 Encounters:  11/20/23 132/84         Passed - Valid encounter within last 12 months    Recent Outpatient Visits           3 weeks ago Type 2 diabetes mellitus with diabetic nephropathy, with long-term current use of insulin  [facility])   [place] [facility] [place], Marsa PARAS, DO   1 month ago Acute non-recurrent frontal sinusitis   [place] [facility] [place], Marsa PARAS, DO   7 months ago Annual physical exam   [facility] [place] [facility] PARAS, DO       Future Appointments             In 6 months Hester Alm BROCKS, MD [facility]             Signed Prescriptions Disp Refills   allopurinol  (ZYLOPRIM ) 100 MG tablet 90 tablet 1    Sig: TAKE 1 TABLET DAILY     Endocrinology:  Gout Agents - allopurinol  Failed - 12/15/2023 12:30 PM      Failed - Cr in normal range and within 360 days    Creat  Date Value Ref Range Status  05/08/2023 2.27 (H) 0.70 - 1.28 mg/dL Final   Creatinine, Urine  Date Value Ref Range Status  09/09/2022 73  Final         Passed - Uric Acid in normal range and within 360 days    Uric Acid, Serum  Date Value Ref Range Status  05/08/2023 7.7 4.0 - 8.0 mg/dL Final    Comment:    Therapeutic target for gout patients: <6.0 mg/dL .          Passed - Valid encounter within last 12 months    Recent Outpatient Visits           3 weeks ago Type 2 diabetes mellitus with diabetic nephropathy, with long-term current use of insulin  [facility])   [facility] [place], Marsa PARAS, DO   1 month ago  Acute non-recurrent frontal sinusitis   [place] [facility] [place], Marsa PARAS, DO   7 months ago Annual physical exam   [place] [facility] [place] Marsa PARAS, DO       Future Appointments             In 6 months Hester Alm BROCKS, MD [place] [facility]            Passed - CBC within normal limits and completed in the last 12 months    WBC  Date Value Ref Range Status  05/08/2023 7.9 3.8 - 10.8 Thousand/uL Final   RBC  Date Value Ref Range Status  05/08/2023 3.25 (L) 4.20 - 5.80 Million/uL Final   Hemoglobin  Date Value Ref Range Status  05/08/2023 11.0 (L) 13.2 - 17.1 g/dL Final   HGB  Date Value Ref Range Status  04/01/2013 11.5 (L) 13.0 - 18.0 g/dL Final   HCT  Date Value Ref Range Status  05/08/2023 32.5 (L) 38.5 - 50.0 % Final  04/01/2013 33.0 (L) 40.0 - 52.0 % Final   MCHC  Date Value Ref Range Status  05/08/2023 33.8 32.0 - 36.0 g/dL Final    Comment:    For adults, a slight  decrease in the calculated MCHC value (in the range of 30 to 32 g/dL) is most likely not clinically significant; however, it should be interpreted with caution in correlation with other red cell parameters and the patient's clinical condition.    [facility]  Date Value Ref Range Status  05/08/2023 33.8 (H) 27.0 - 33.0 pg Final   MCV  Date Value Ref Range Status  05/08/2023 100.0 80.0 - 100.0 fL Final  04/01/2013 92 80 - 100 fL Final   No results found for: PLTCOUNTKUC, LABPLAT, POCPLA RDW  Date Value Ref Range Status  05/08/2023 13.9 11.0 - 15.0 % Final  04/01/2013 12.9 11.5 - 14.5 % Final          glyBURIDE  (DIABETA ) 5 MG tablet 360 tablet 1    Sig: TAKE 2 TABLETS TWICE A DAY WITH MEALS     Endocrinology:  Diabetes - Sulfonylureas - glyburide  Failed - 12/15/2023 12:30 PM      Failed - HBA1C in normal range and within 180 days    Hemoglobin A1C  Date Value Ref Range Status  11/20/2023 7.4 (A) 4.0 - 5.6 % Final  03/16/2020 7.0  Final   Hgb A1c MFr Bld  Date Value Ref Range Status  05/08/2023 7.7 (H) <5.7 % Final    Comment:    For someone without known diabetes, a hemoglobin A1c value of 6.5% or greater indicates that they may have  diabetes and this should be confirmed with a follow-up  test. . For someone with known diabetes, a value <7% indicates  that their diabetes is well controlled and a value  greater than or equal to 7% indicates suboptimal  control. A1c targets should be individualized based on  duration of diabetes, age, comorbid conditions, and  other considerations. . Currently, no consensus exists regarding use of hemoglobin A1c for diagnosis of diabetes for children. .          Failed - Cr in normal range and within 360 days    Creat  Date Value Ref Range Status  05/08/2023 2.27 (H) 0.70 - 1.28 mg/dL Final   Creatinine, Urine  Date Value Ref Range Status  09/09/2022 73  Final  Failed - eGFR is 60 or above and within 360 days     GFR, Est African American  Date Value Ref Range Status  01/08/2020 31 (L) > OR = 60 mL/min/1.65m2 Final   GFR, Est Non African American  Date Value Ref Range Status  01/08/2020 26 (L) > OR = 60 mL/min/1.34m2 Final   GFR  Date Value Ref Range Status  01/17/2012 50.34 (L) >60.00 mL/min Final   eGFR  Date Value Ref Range Status  04/27/2022 30 (L) > OR = 60 mL/min/1.30m2 Final         Passed - Valid encounter within last 6 months    Recent Outpatient Visits           3 weeks ago Type 2 diabetes mellitus with diabetic nephropathy, with long-term current use of insulin  [facility])   [place] [facility] [place], [place] J, DO   1 month ago Acute non-recurrent frontal sinusitis   [place] [facility] [place], Marsa PARAS, DO   7 months ago Annual physical exam   [place] [facility] [place]a PARAS, DO       Future Appointments             In 6 months Hester Alm BROCKS, MD [facility]

## 2024-01-11 ENCOUNTER — Ambulatory Visit: Admitting: Podiatry

## 2024-01-11 DIAGNOSIS — S90822A Blister (nonthermal), left foot, initial encounter: Secondary | ICD-10-CM

## 2024-01-11 NOTE — Progress Notes (Unsigned)
 Left hallux blister superficial    Orhotics were dispsend

## 2024-01-23 ENCOUNTER — Ambulatory Visit (INDEPENDENT_AMBULATORY_CARE_PROVIDER_SITE_OTHER): Admitting: Family Medicine

## 2024-01-23 ENCOUNTER — Encounter: Payer: Self-pay | Admitting: Family Medicine

## 2024-01-23 VITALS — BP 128/62 | HR 76 | Ht 65.0 in | Wt 265.2 lb

## 2024-01-23 DIAGNOSIS — E113293 Type 2 diabetes mellitus with mild nonproliferative diabetic retinopathy without macular edema, bilateral: Secondary | ICD-10-CM | POA: Diagnosis not present

## 2024-01-23 DIAGNOSIS — H903 Sensorineural hearing loss, bilateral: Secondary | ICD-10-CM | POA: Diagnosis not present

## 2024-01-23 DIAGNOSIS — N1832 Chronic kidney disease, stage 3b: Secondary | ICD-10-CM

## 2024-01-23 DIAGNOSIS — D333 Benign neoplasm of cranial nerves: Secondary | ICD-10-CM | POA: Diagnosis not present

## 2024-01-23 DIAGNOSIS — R55 Syncope and collapse: Secondary | ICD-10-CM | POA: Diagnosis not present

## 2024-01-23 DIAGNOSIS — E1121 Type 2 diabetes mellitus with diabetic nephropathy: Secondary | ICD-10-CM

## 2024-01-23 DIAGNOSIS — Z794 Long term (current) use of insulin: Secondary | ICD-10-CM | POA: Diagnosis not present

## 2024-01-23 DIAGNOSIS — I129 Hypertensive chronic kidney disease with stage 1 through stage 4 chronic kidney disease, or unspecified chronic kidney disease: Secondary | ICD-10-CM

## 2024-01-23 DIAGNOSIS — N183 Chronic kidney disease, stage 3 unspecified: Secondary | ICD-10-CM

## 2024-01-23 NOTE — Progress Notes (Signed)
 "  Subjective:    Patient ID: [name], male    DOB: Oct 12, 1945, 79 y.o.   MRN: [id number]  [name] is a 79 y.o. male presenting on 01/23/2024 for Medical Management of Chronic Issues, Diabetes, Hypertension, and Loss of Consciousness   HPI  Discussed the use of AI scribe software for clinical note transcription with the patient, who gave verbal consent to proceed.  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  Right Acoustic Neuroma Followed by [facility], updated MRI 01/08/24 see report. - Right-sided acoustic neuroma confirmed with interval growth by MRI, size transitioning from small to medium, and they are on 1 year surveillance with imaging. No other intervention at this time. - Sensation of head feeling 'like it's underwater' / constellation of vestibular symptoms attributed to this neuroma - Significant tinnitus, described as so loud he can hardly hear anything else - Complete hearing loss in the affected ear - Constant earache - Admits headache  Postural / Pre-Syncope vs Syncope - Likely related to acoustic neuroma and vestibular dysfunction - Episodes of syncope, particularly associated with bending over  Hypertension Blood pressure variability Followed by Cardiology - Blood pressure readings range from 140-170/64-74 mmHg - Lower blood pressure readings typically occur during doctor visits - Currently taking BP regimen including carvedilol , hydrochlorothiazide, and torsemide - Note Cardiology reduced  telmisartan, recently reduced to 20 mg (from 40mg ) - Cardiology suspected orthostatic hypotension component. 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 - Dietary changes implemented: primarily vegetables, reduced meat and sweets - Recent blood sugar readings averaging below 100 mg/dL - he continues on insulin  regimen at this time. Not endorsing hypoglycemia.  Medication management - Currently taking atorvastatin  and tamsulosin  - No refills left on atorvastatin  and tamsulosin , but recently filled with supply for the next few months       11/20/2023    8:16 AM 05/15/2023    8:05 AM 11/08/2022    9:12 AM  Depression screen PHQ 2/9  Decreased Interest 1 0 0  Down, Depressed, Hopeless 1 0 0  PHQ - 2 Score 2 0 0  Altered sleeping 3 1 0  Tired, decreased energy 1 0 0  Change in appetite 1 0 0  Feeling bad or failure about yourself  1 0 0  Trouble concentrating 0 0 0  Moving slowly or fidgety/restless 0 0 0  Suicidal thoughts 0  0  PHQ-9 Score 8 1  0   Difficult doing work/chores Somewhat difficult Not difficult at all Not difficult at all     Data saved with a previous flowsheet row definition       11/20/2023    8:16 AM 05/15/2023    8:05 AM 11/08/2022    9:13 AM 05/05/2022    8:38 AM  GAD 7 : Generalized Anxiety Score  Nervous, Anxious, on Edge 1  0  0  0   Control/stop worrying 1  0  0  0   Worry too much - different things 1  0  0  0   Trouble relaxing 0  0  0  0   Restless 0  0  0  0   Easily annoyed or irritable 0  0  0  0   Afraid - awful might happen 1  0  0  0   Total GAD 7 Score 4 0 0 0  Anxiety Difficulty Somewhat difficult        Data saved with a previous flowsheet row definition    Social History[1]  Review of Systems Per HPI unless specifically indicated above     Objective:    BP 128/62 (BP Location: Left Arm, Patient Position: Sitting, Cuff Size: Large)   Pulse 76   Ht 5' 5 (1.651 m)   Wt 265 lb 4 oz (120.3 kg)   SpO2 96%   BMI 44.14 kg/m   Wt Readings from Last 3 Encounters:  01/23/24 265 lb 4 oz (120.3 kg)   11/20/23 253 lb 4 oz (114.9 kg)  11/10/23 258 lb 4 oz (117.1 kg)    Physical Exam Vitals and nursing note reviewed.  Constitutional:      General: He is not in acute distress.    Appearance: Normal appearance. He is well-developed. He is obese. He is not diaphoretic.     Comments: Well-appearing, comfortable, cooperative  HENT:     Head: Normocephalic and atraumatic.  Eyes:     General:        Right eye: No discharge.        Left eye: No discharge.     Conjunctiva/sclera: Conjunctivae normal.  Cardiovascular:     Rate and Rhythm: Normal rate.  Pulmonary:     Effort: Pulmonary effort is normal.  Skin:    General: Skin is warm and dry.     Findings: No erythema or rash.  Neurological:     Mental Status: He is alert and oriented to person, place, and time.  Psychiatric:        Mood and Affect: Mood normal.        Behavior: Behavior normal.        Thought Content: Thought content normal.     Comments: Well groomed, good eye contact, normal speech and thoughts     I have personally reviewed the radiology report from 01/08/24 on MRI auditory canals.  MRI internal auditory canals without contrast  Anatomical Region Laterality Modality  Head -- Magnetic Resonance   Narrative  Brain/internal auditory canals MRI without contrast  HISTORY: Right vestibular schwannoma  COMPARISONS: MRI 11/07/2022  TECHNIQUE: Multiplanar MR imaging of the brain and internal auditory canals is performed without intravenous contrast.  FINDINGS: There is a mass within the right cerebellopontine angle cistern and extending into the internal auditory canal, measuring 14 x 10 x 8 mm, about 1 mm larger in each dimension compared to prior. There is no mass lesion within the left internal auditory canal.  The cerebellar tonsils are normally positioned. The ventricles and sulci are appropriate in size for age. There is no extra-axial collection, mass effect, or midline shift. No significant T2 FLAIR  signal abnormalities within the brain. There is no evidence of acute infarct on diffusion-weighted images. A punctate focus of chronic microhemorrhage on susceptibility weighted imaging within the left occipital lobe, similar to prior. Expected flow voids are seen in the major intracranial vessels. Previous cataract surgery. No significant opacification of the mastoid air cells. Mild right mastoid air cell fluid is present.  IMPRESSION: 1. 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 2. No acute  intracranial abnormality.  Electronically Signed by:  Lonni Flattery, MD, [address] [facility] Radiology Electronically Signed on:  01/08/2024 10:19 AM Procedure Note  Flattery Lonni Rubin, MD - 01/08/2024 Formatting of this note might be different from the original. Brain/internal auditory canals MRI without contrast  HISTORY: Right vestibular schwannoma  COMPARISONS: MRI 11/07/2022  TECHNIQUE: Multiplanar MR imaging of the brain and internal auditory canals is performed without intravenous contrast.  FINDINGS: There is a mass within the right cerebellopontine angle cistern and extending into the internal auditory canal, measuring 14 x 10 x 8 mm, about 1 mm larger in each dimension compared to prior. There is no mass lesion within the left internal auditory canal.  The cerebellar tonsils are normally positioned. The ventricles and sulci are appropriate in size for age. There is no extra-axial collection, mass effect, or midline shift. No significant T2 FLAIR signal abnormalities within the brain. There is no evidence of acute infarct on diffusion-weighted images. A punctate focus of chronic microhemorrhage on susceptibility weighted imaging within the left occipital lobe, similar to prior. Expected flow voids are seen in the major intracranial vessels. Previous cataract surgery. No significant opacification of the mastoid  air cells. Mild right mastoid air cell fluid is present.  IMPRESSION: 1. 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 2. No acute intracranial abnormality.  Electronically Signed by:  Lonni Flattery, MD, [facility] Radiology Electronically Signed on:  01/08/2024 10:19 AM Exam End: 01/08/24 10:06   Specimen Collected: 01/08/24 Last Resulted: 01/08/24 10:19  Received From: [facility]  Result Received: 01/16/24 08:09    Results for orders placed or performed in visit on 12/13/23  OPHTHALMOLOGY REPORT-SCANNED   Collection Time: 12/13/23  2:57 PM  Result Value Ref Range   HM Diabetic Eye Exam Retinopathy (A) No Retinopathy   A Comment        Assessment & Plan:   Problem List Items Addressed This Visit     Acoustic neuroma (HCC)   Asymmetrical sensorineural hearing loss   Benign hypertension with CKD (chronic kidney disease) stage III (HCC)   Stage 3b chronic kidney disease (HCC)   Type 2 diabetes mellitus with diabetic nephropathy (HCC) - Primary   Relevant Orders   Hemoglobin A1c   Other Visit Diagnoses       Long term current use of insulin  (HCC)         Mild nonproliferative diabetic retinopathy of both eyes without macular edema associated with type 2 diabetes mellitus (HCC)         Pre-syncope             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 ENT attributed symptoms to the neuroma and is conducting yearly MRI surveillance. - Seems to be linked to his cause of pre-syncope and syncopal episodes or postural symptoms. Worse with bending forward and has chronic vestibular symptoms - Continue yearly MRI surveillance for acoustic  neuroma.  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 Blood sugar levels have decreased significantly with dietary changes. He is motivated to continue improving control. - Ordered A1c test in one month. - Continue treatment plan per Endocrinology with [place] insulin  70u daily, Humalog  21u THREE TIMES A DAY as instructed, Glyburide  - Caution hypoglycemia - Continue current dietary regimen focusing on vegetables and avoiding meats and sweets. - Encouraged sustainable lifestyle changes to maintain blood sugar control.  Hypertension Possible Orthostatic Hypotension Followed by Cardiology Variable blood pressure readings. Higher BP at home and lower in office. Adjustments made recently within past few weeks by Cardiologist. Recently Telmisartan lowered 40 to 20mg . - Continue current antihypertensive regimen with telmisartan 20 mg update, hydrochlorothiazide 25mg , carvedilol  25mg  BID - Monitor blood pressure regularly.        Orders Placed This Encounter  Procedures   Hemoglobin A1c    Standing Status:   Future    Expected Date:   02/23/2024    Expiration Date:   01/22/2025    No orders of the defined types were placed in this encounter.   Follow up plan: Return in about 4 weeks (around 02/20/2024) for 4 weeks AM fasting lab any day, mid May 2026 fasting lab then 1 week later Annual Physical.  Future labs ordered for 02/23/24 A1c only  Then next time physical labs 05/2024 + urine microalbumin   Marsa Officer, DO [facility] 01/23/2024, 8:35 AM     [1]  Social History Tobacco Use   Smoking status: Never   Smokeless tobacco: Never   Tobacco comments:    tobacco use- no   Vaping Use   Vaping status: Never Used  Substance Use Topics   Alcohol use: Never   Drug use: No   "

## 2024-01-23 NOTE — Patient Instructions (Addendum)
 Thank you for coming to the office today.  Return for A1c in 1 month  Keep improving lifestyle diet as you are.  Then physical in May 2026 with labs.  DUE for FASTING BLOOD WORK (no food or drink after midnight before the lab appointment, only water or coffee without cream/sugar on the morning of)  - Make sure Lab Only appointment is at about 1 week before your next appointment, so that results will be available  For Lab Results, once available within 2-3 days of blood draw, you can can log in to MyChart online to view your results and a brief explanation. Also, we can discuss results at next follow-up visit.   Please schedule a Follow-up Appointment to: Return in about 4 weeks (around 02/20/2024) for 4 weeks AM fasting lab any day, mid May 2026 fasting lab then 1 week later Annual Physical.  If you have any other questions or concerns, please feel free to call the office or send a message through MyChart. You may also schedule an earlier appointment if necessary.  Additionally, you may be receiving a survey about your experience at our office within a few days to 1 week by e-mail or mail. We value your feedback.  Marsa Officer, DO [facility], [place]

## 2024-02-23 ENCOUNTER — Other Ambulatory Visit

## 2024-04-11 ENCOUNTER — Ambulatory Visit: Admitting: Podiatry

## 2024-05-10 ENCOUNTER — Other Ambulatory Visit

## 2024-05-17 ENCOUNTER — Encounter: Admitting: Family Medicine

## 2024-07-03 ENCOUNTER — Ambulatory Visit: Admitting: Dermatology
